# Patient Record
Sex: Female | Born: 1955 | Race: White | Hispanic: No | Marital: Married | Smoking: Never smoker
Health system: Southern US, Community
[De-identification: ages and names within clinical notes are randomized; demographics above are authoritative.]

## PROBLEM LIST (undated history)

## (undated) DIAGNOSIS — C9110 Chronic lymphocytic leukemia of B-cell type not having achieved remission: Secondary | ICD-10-CM

## (undated) DIAGNOSIS — T7840XA Allergy, unspecified, initial encounter: Secondary | ICD-10-CM

## (undated) DIAGNOSIS — E669 Obesity, unspecified: Secondary | ICD-10-CM

## (undated) DIAGNOSIS — Z780 Asymptomatic menopausal state: Secondary | ICD-10-CM

## (undated) DIAGNOSIS — N200 Calculus of kidney: Secondary | ICD-10-CM

## (undated) DIAGNOSIS — K219 Gastro-esophageal reflux disease without esophagitis: Secondary | ICD-10-CM

## (undated) DIAGNOSIS — F419 Anxiety disorder, unspecified: Secondary | ICD-10-CM

## (undated) DIAGNOSIS — M1990 Unspecified osteoarthritis, unspecified site: Secondary | ICD-10-CM

## (undated) DIAGNOSIS — L900 Lichen sclerosus et atrophicus: Secondary | ICD-10-CM

## (undated) DIAGNOSIS — K8020 Calculus of gallbladder without cholecystitis without obstruction: Secondary | ICD-10-CM

## (undated) DIAGNOSIS — N390 Urinary tract infection, site not specified: Secondary | ICD-10-CM

## (undated) DIAGNOSIS — R12 Heartburn: Secondary | ICD-10-CM

## (undated) HISTORY — DX: Calculus of kidney: N20.0

## (undated) HISTORY — DX: Lichen sclerosus et atrophicus: L90.0

## (undated) HISTORY — DX: Calculus of gallbladder without cholecystitis without obstruction: K80.20

## (undated) HISTORY — PX: CHOLECYSTECTOMY: SHX55

## (undated) HISTORY — DX: Gastro-esophageal reflux disease without esophagitis: K21.9

## (undated) HISTORY — DX: Heartburn: R12

## (undated) HISTORY — DX: Chronic lymphocytic leukemia of B-cell type not having achieved remission: C91.10

## (undated) HISTORY — DX: Allergy, unspecified, initial encounter: T78.40XA

## (undated) HISTORY — PX: DILATION AND CURETTAGE OF UTERUS: SHX78

## (undated) HISTORY — DX: Urinary tract infection, site not specified: N39.0

## (undated) HISTORY — DX: Obesity, unspecified: E66.9

## (undated) HISTORY — DX: Unspecified osteoarthritis, unspecified site: M19.90

## (undated) HISTORY — DX: Asymptomatic menopausal state: Z78.0

## (undated) HISTORY — DX: Anxiety disorder, unspecified: F41.9

## (undated) HISTORY — PX: UPPER GASTROINTESTINAL ENDOSCOPY: SHX188

---

## 1975-12-10 HISTORY — PX: KIDNEY STONE SURGERY: SHX686

## 2003-12-10 HISTORY — PX: ESOPHAGOGASTRODUODENOSCOPY: SHX1529

## 2004-01-05 ENCOUNTER — Encounter: Payer: Self-pay | Admitting: Family Medicine

## 2004-11-20 ENCOUNTER — Encounter: Payer: Self-pay | Admitting: Family Medicine

## 2006-01-29 ENCOUNTER — Encounter: Payer: Self-pay | Admitting: Family Medicine

## 2006-07-07 ENCOUNTER — Encounter: Payer: Self-pay | Admitting: Family Medicine

## 2006-08-26 ENCOUNTER — Encounter: Payer: Self-pay | Admitting: Family Medicine

## 2006-09-24 ENCOUNTER — Encounter: Payer: Self-pay | Admitting: Family Medicine

## 2006-12-09 HISTORY — PX: HYSTEROSCOPY WITH D & C: SHX1775

## 2006-12-09 HISTORY — PX: CARDIAC CATHETERIZATION: SHX172

## 2008-06-01 ENCOUNTER — Encounter: Payer: Self-pay | Admitting: Family Medicine

## 2008-11-01 ENCOUNTER — Encounter: Payer: Self-pay | Admitting: Family Medicine

## 2008-11-07 ENCOUNTER — Encounter: Payer: Self-pay | Admitting: Family Medicine

## 2008-12-09 HISTORY — PX: COLONOSCOPY: SHX174

## 2009-01-02 ENCOUNTER — Encounter: Payer: Self-pay | Admitting: Family Medicine

## 2009-05-22 ENCOUNTER — Ambulatory Visit: Payer: Self-pay | Admitting: Family Medicine

## 2009-05-22 DIAGNOSIS — K219 Gastro-esophageal reflux disease without esophagitis: Secondary | ICD-10-CM

## 2009-05-22 DIAGNOSIS — R609 Edema, unspecified: Secondary | ICD-10-CM | POA: Insufficient documentation

## 2009-05-22 DIAGNOSIS — E669 Obesity, unspecified: Secondary | ICD-10-CM | POA: Insufficient documentation

## 2009-05-22 HISTORY — DX: Edema, unspecified: R60.9

## 2009-05-22 HISTORY — DX: Obesity, unspecified: E66.9

## 2009-05-22 HISTORY — DX: Gastro-esophageal reflux disease without esophagitis: K21.9

## 2009-05-25 ENCOUNTER — Encounter: Payer: Self-pay | Admitting: Family Medicine

## 2009-05-29 LAB — CONVERTED CEMR LAB
AST: 14 units/L (ref 0–37)
Alkaline Phosphatase: 74 units/L (ref 39–117)
BUN: 13 mg/dL (ref 6–23)
Calcium: 8.8 mg/dL (ref 8.4–10.5)
Cholesterol: 208 mg/dL — ABNORMAL HIGH (ref 0–200)
LDL Cholesterol: 137 mg/dL — ABNORMAL HIGH (ref 0–99)
Potassium: 4.3 meq/L (ref 3.5–5.3)
Total CHOL/HDL Ratio: 4.2
Triglycerides: 111 mg/dL (ref <150)

## 2009-06-21 ENCOUNTER — Ambulatory Visit: Payer: Self-pay | Admitting: Obstetrics & Gynecology

## 2009-07-11 ENCOUNTER — Encounter: Payer: Self-pay | Admitting: Family Medicine

## 2009-09-15 ENCOUNTER — Telehealth: Payer: Self-pay | Admitting: Family Medicine

## 2009-09-21 ENCOUNTER — Ambulatory Visit: Payer: Self-pay | Admitting: Family Medicine

## 2010-02-21 ENCOUNTER — Ambulatory Visit: Payer: Self-pay | Admitting: Obstetrics & Gynecology

## 2010-02-22 ENCOUNTER — Encounter: Payer: Self-pay | Admitting: Obstetrics & Gynecology

## 2010-02-22 LAB — CONVERTED CEMR LAB: Clue Cells Wet Prep HPF POC: NONE SEEN

## 2010-03-06 ENCOUNTER — Encounter: Admission: RE | Admit: 2010-03-06 | Discharge: 2010-03-06 | Payer: Self-pay | Admitting: Obstetrics & Gynecology

## 2010-06-19 ENCOUNTER — Ambulatory Visit: Payer: Self-pay | Admitting: Family Medicine

## 2010-06-19 DIAGNOSIS — M2550 Pain in unspecified joint: Secondary | ICD-10-CM | POA: Insufficient documentation

## 2010-06-19 DIAGNOSIS — J209 Acute bronchitis, unspecified: Secondary | ICD-10-CM | POA: Insufficient documentation

## 2010-06-19 HISTORY — DX: Pain in unspecified joint: M25.50

## 2010-06-20 LAB — CONVERTED CEMR LAB
Basophils Absolute: 0 10*3/uL (ref 0.0–0.1)
Basophils Relative: 0 % (ref 0–1)
Eosinophils Absolute: 0 10*3/uL (ref 0.0–0.7)
Eosinophils Relative: 0 % (ref 0–5)
Hemoglobin: 13.4 g/dL (ref 12.0–15.0)
Lymphocytes Relative: 25 % (ref 12–46)
Lymphs Abs: 1.7 10*3/uL (ref 0.7–4.0)
MCHC: 32.1 g/dL (ref 30.0–36.0)
MCV: 96.1 fL (ref 78.0–100.0)
Neutrophils Relative %: 67 % (ref 43–77)
WBC: 6.6 10*3/uL (ref 4.0–10.5)

## 2010-08-11 ENCOUNTER — Ambulatory Visit: Payer: Self-pay | Admitting: Family Medicine

## 2010-08-11 DIAGNOSIS — K921 Melena: Secondary | ICD-10-CM | POA: Insufficient documentation

## 2010-08-11 DIAGNOSIS — R1084 Generalized abdominal pain: Secondary | ICD-10-CM | POA: Insufficient documentation

## 2010-08-11 LAB — CONVERTED CEMR LAB
BUN: 17 mg/dL (ref 6–23)
Calcium: 9.5 mg/dL (ref 8.4–10.5)
Chloride: 106 meq/L (ref 96–112)
Creatinine, Ser: 0.62 mg/dL (ref 0.40–1.20)
Glucose, Bld: 92 mg/dL (ref 70–99)
Potassium: 4.8 meq/L (ref 3.5–5.3)
Sodium: 143 meq/L (ref 135–145)

## 2010-08-13 ENCOUNTER — Encounter: Payer: Self-pay | Admitting: Family Medicine

## 2010-08-14 ENCOUNTER — Encounter: Payer: Self-pay | Admitting: Family Medicine

## 2010-09-21 ENCOUNTER — Encounter: Payer: Self-pay | Admitting: Family Medicine

## 2010-11-20 ENCOUNTER — Encounter: Payer: Self-pay | Admitting: Obstetrics & Gynecology

## 2010-11-20 ENCOUNTER — Ambulatory Visit: Payer: Self-pay | Admitting: Obstetrics & Gynecology

## 2010-11-20 LAB — CONVERTED CEMR LAB
Clue Cells Wet Prep HPF POC: NONE SEEN
Trich, Wet Prep: NONE SEEN
Yeast Wet Prep HPF POC: NONE SEEN

## 2010-12-05 ENCOUNTER — Ambulatory Visit: Payer: Self-pay | Admitting: Obstetrics & Gynecology

## 2010-12-25 ENCOUNTER — Ambulatory Visit
Admission: RE | Admit: 2010-12-25 | Discharge: 2010-12-25 | Payer: Self-pay | Source: Home / Self Care | Attending: Obstetrics & Gynecology | Admitting: Obstetrics & Gynecology

## 2010-12-26 ENCOUNTER — Ambulatory Visit
Admission: RE | Admit: 2010-12-26 | Discharge: 2010-12-26 | Payer: Self-pay | Source: Home / Self Care | Attending: Family Medicine | Admitting: Family Medicine

## 2010-12-26 DIAGNOSIS — N390 Urinary tract infection, site not specified: Secondary | ICD-10-CM | POA: Insufficient documentation

## 2010-12-26 LAB — CONVERTED CEMR LAB
Bilirubin Urine: NEGATIVE
Ketones, urine, test strip: NEGATIVE
Specific Gravity, Urine: 1.025
pH: 6

## 2010-12-27 ENCOUNTER — Ambulatory Visit: Admit: 2010-12-27 | Payer: Self-pay | Admitting: Family Medicine

## 2011-01-08 NOTE — Miscellaneous (Signed)
Summary: Flu/[name]  Flu/[place] [facility]   Imported By: Lanelle Bal 01/15/2010 10:59:35  _____________________________________________________________________  External Attachment:    Type:   Image     Comment:   External Document

## 2011-01-08 NOTE — Assessment & Plan Note (Signed)
Summary: Followup call  Followup call to patient:  she reports that she has less abdominal discomfort.  Stools are now firm but she continues to have blood coating her stools.  No fever.  She feels well otherwise.  Discussed normal CMP results with patient.  Plan:   Arrange GI follow-up as soon as possible.  Will refer to [facility] gastroenterology. If symptoms become significantly worse during the night or over the weekend, proceed to the local emergency room.  Donna Christen MD  August 13, 2010 9:50 AM    08/14/10.  Called [facility] @ [phone] to make appointment.  Had cancellation today, patient is scheduled for today @ 1:45.  She will be seen by Dr. Rolland Bimler.  Patient is aware of appointment.  WB

## 2011-01-08 NOTE — Letter (Signed)
Summary: [facility]   Imported By: Lanelle Bal 08/23/2010 11:52:41  _____________________________________________________________________  External Attachment:    Type:   Image     Comment:   External Document

## 2011-01-08 NOTE — Miscellaneous (Signed)
Summary: colonoscopy normal  Clinical Lists Changes  Observations: Added new observation of COLONRECACT: Repeat colonoscopy in 5 years.  (09/19/2010 16:46) Added new observation of COLONOSCOPY: Location:  [facility]   normal.  (09/19/2010 16:46)      Colonoscopy  Procedure date:  09/19/2010  Findings:      Location:  [facility]   normal.   Comments:      Repeat colonoscopy in 5 years.    Colonoscopy  Procedure date:  09/19/2010  Findings:      Location:  [facility]   normal.   Comments:      Repeat colonoscopy in 5 years.

## 2011-01-08 NOTE — Assessment & Plan Note (Signed)
Summary: cough/ joint pains   Vital Signs:  Patient profile:   55 year old female Height:      64 inches Weight:      238 pounds BMI:     41.00 O2 Sat:      96 % on Room air Temp:     98.8 degrees F oral Pulse rate:   103 / minute BP sitting:   112 / 72  (left arm) Cuff size:   large  Vitals Entered By: Payton Spark CMA (June 19, 2010 10:48 AM)  O2 Flow:  Room air CC: Body aches, cough and diarrhea. Also c/o L hand pain.   Primary Care Provider:  Seymour Bars DO  CC:  Body aches and cough and diarrhea. Also c/o L hand pain.Marland Kitchen  History of Present Illness: 55 yo WF presents for a dry cough that started about 5 days ago.  Denies any sore throat or runny nose.  Denies any sputum production.  Has fatigue, malaise, achy joints and diarrhea.  Has nausea but no vomitting.  Denies having much of an appetite.  She is taking Extra Strength Tylenol and Delsym.  Has had a temp up to 101 at night.  Having chills.  Denies any sick contacts.    She is also having some pain in the hand joints that started 2 mos ago.    Current Medications (verified): 1)  Prilosec Otc 20 Mg Tbec (Omeprazole Magnesium) .... Take 1 Tab By Mouth Once Daily  Allergies (verified): No Known Drug Allergies  Past History:  Past Medical History: Reviewed history from 05/22/2009 and no changes required. obesity GERD  gyn: DR Margo Aye, pap 12-2008 colonoscopy done 2006 -- normal with EGD  Past Surgical History: Reviewed history from 07/11/2009 and no changes required. kidney stone removal 1978 heart cath 2006, normal D&C 2008; hysteroscopy for endometrial polyp  Family History: Reviewed history from 05/22/2009 and no changes required. father alive, AMI at 38, stroke in 15s, high chol, HTN mother HTN brother healthy  Social History: Reviewed history from 05/22/2009 and no changes required. 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  Has 2 grown children. son local, daughter  in [place] with 5 grandchildren. Denies ETOH. Never smoked. Walks on treadmill 30 min 4 x a wk.  Review of Systems      See HPI  Physical Exam  General:  alert, well-developed, well-nourished, and well-hydrated.  obese Head:  normocephalic and atraumatic.  sinuses NTTP Eyes:  conjunctiva clear; sclera non icteric Mouth:  pharynx pink and moist.   Neck:  no masses.   Lungs:  Normal respiratory effort, chest expands symmetrically. Lungs are clear to auscultation, no crackles or wheezes. dry hacking cough Heart:  Normal rate and regular rhythm. S1 and S2 normal without gallop, murmur, click, rub or other extra sounds. Abdomen:  soft, non-tender, normal bowel sounds, no distention, no masses, no guarding, no hepatomegaly, and no splenomegaly.   Extremities:  no LE edema Skin:  color normal.  no jaundice Cervical Nodes:  No lymphadenopathy noted Psych:  good eye contact, not anxious appearing, and not depressed appearing.     Impression & Recommendations:  Problem # 1:  ACUTE BRONCHITIS (ICD-466.0) Likely viral syndrome, going on day 6 with constitutional symptoms.  Will cover her with Zithromax and check labs today. Her updated medication list for this problem includes:    Zithromax Z-pak 250 Mg Tabs (Azithromycin) .Marland Kitchen... 2 tabs by mouth x 1 day then 1 tab by  mouth daily x 4 days  Problem # 2:  POLYARTHRITIS (ICD-719.49) Likely due to viral syndrome.  Will check ESR with CBC today.  No sign of systemic rash or insect bite. Orders: T-CBC w/Diff (08657-84696) T-Sed Rate (Automated) (29528-41324)  Complete Medication List: 1)  Prilosec Otc 20 Mg Tbec (Omeprazole magnesium) .... Take 1 tab by mouth once daily 2)  Zithromax Z-pak 250 Mg Tabs (Azithromycin) .... 2 tabs by mouth x 1 day then 1 tab by mouth daily x 4 days  Patient Instructions: 1)  Take Zithromax x 5 days for bronchitis. 2)  Use Advil Cold and Flu for symptomatic relief for the next 3-4 days. 3)  Oral hydration. 4)   Labs today. 5)  Will call you w/ results tomorrow. Prescriptions: ZITHROMAX Z-PAK 250 MG TABS (AZITHROMYCIN) 2 tabs by mouth x 1 day then 1 tab by mouth daily x 4 days  #1 pack x 0   Entered and Authorized by:   Seymour Bars DO   Signed by:   Seymour Bars DO on 06/19/2010   Method used:   [phone] (retail)       [address]       [place], [place]  [address]       Ph: [phone] or [phone]       Fax: [phone]   RxID:   [id number]

## 2011-01-08 NOTE — Assessment & Plan Note (Signed)
Summary: D/blood in stool x last night rm 5   Vital Signs:  Patient Profile:   55 Years Old Female CC:      D/blood in stool x last night Height:     64 inches Weight:      239 pounds O2 Sat:      100 % O2 treatment:    Room Air Temp:     97.9 degrees F oral Pulse rate:   64 / minute Pulse rhythm:   regular Resp:     16 per minute BP sitting:   123 / 73  (left arm) Cuff size:   regular  Vitals Entered By: Areta Haber CMA (August 11, 2010 1:51 PM)                  Current Allergies: No known allergies History of Present Illness Chief Complaint: D/blood in stool x last night History of Present Illness:  Subjective:  Patient complains of feeling abdominal bloating without pain yesterday morning, and some hearburn last night.  She takes Prilosec daily.  At [facility] today she had abdominal discomfort.  She then had a bowel movement with blood in stool.  She had another bowel movement later, more loose, with persistent blood in stool.  No nausea/vomiting.  No fevers, chills, and sweats.  No recent changes in bowel movements otherwise.  She denies pain with bowel movements.  No hemorrhoids.  She had a colonoscopy in 2006 that was reported as negative.  She states that she does take aspirin  Current Problems: ABDOMINAL PAIN, GENERALIZED (ICD-789.07) HEMATOCHEZIA (ICD-578.1) ACUTE BRONCHITIS (ICD-466.0) POLYARTHRITIS (ICD-719.49) GERD (ICD-530.81) OBESITY (ICD-278.00) OTH&UNSPEC ENDOCRN NUTRIT METAB&IMMUNITY D/O (ICD-V77.99) SCREENING FOR LIPOID DISORDERS (ICD-V77.91) LEG EDEMA, BILATERAL (ICD-782.3)   Current Meds PRILOSEC OTC 20 MG TBEC (OMEPRAZOLE MAGNESIUM) Take 1 tab by mouth once daily LORTAB 5 5-500 MG TABS (HYDROCODONE-ACETAMINOPHEN) One by mouth q4 to 6hr as needed pain  REVIEW OF SYSTEMS Constitutional Symptoms      Denies fever, chills, night sweats, weight loss, weight gain, and fatigue.  Eyes       Denies change in vision, eye pain, eye discharge,  glasses, contact lenses, and eye surgery. Ear/Nose/Throat/Mouth       Denies hearing loss/aids, change in hearing, ear pain, ear discharge, dizziness, frequent runny nose, frequent nose bleeds, sinus problems, sore throat, hoarseness, and tooth pain or bleeding.  Respiratory       Denies dry cough, productive cough, wheezing, shortness of breath, asthma, bronchitis, and emphysema/COPD.  Cardiovascular       Denies murmurs, chest pain, and tires easily with exhertion.    Gastrointestinal       Complains of stomach pain, diarrhea, and blood in bowel movements.      Denies nausea/vomiting, constipation, and indigestion.      Comments: x last night Genitourniary       Denies painful urination, kidney stones, and loss of urinary control. Neurological       Denies paralysis, seizures, and fainting/blackouts. Musculoskeletal       Denies muscle pain, joint pain, joint stiffness, decreased range of motion, redness, swelling, muscle weakness, and gout.  Skin       Denies bruising, unusual mles/lumps or sores, and hair/skin or nail changes.  Psych       Denies mood changes, temper/anger issues, anxiety/stress, speech problems, depression, and sleep problems.  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  Has 2 grown children. son local, daughter in [place] with 5 grandchildren. Denies ETOH. Never smoked. Walks on treadmill 30 min 4 x a wk.  Risk Factors: Alcohol Use: 0 (05/22/2009) Exercise: yes  (05/22/2009)  Risk Factors: Smoking Status: never (05/22/2009)   Objective:  No acute distress  Eyes:  Pupils are equal, round, and reactive to light and accomdation.  Extraocular movement is intact.  Conjunctivae are not inflamed.  Mouth:  Normal mucosae Neck:  Supple.  No adenopathy is present.  No thyromegaly is present  Lungs:  Clear to auscultation.  Breath sounds are equal.  Heart:  Regular rate and rhythm without murmurs, rubs, or gallops.  Abdomen:  Nontender without masses or hepatosplenomegaly.  Bowel sounds are present.  No CVA or flank tenderness.  CBC:  WBC 6.5; Hgb 13.2; platelets 189 Assessment New Problems: ABDOMINAL PAIN, GENERALIZED (ICD-789.07) HEMATOCHEZIA (ICD-578.1)  NO EVIDENCE ACTIVE GI BLEEDING.  EXAM BENIGN.  Plan New Medications/Changes: LORTAB 5 5-500 MG TABS (HYDROCODONE-ACETAMINOPHEN) One by mouth q4 to 6hr as needed pain  #12 (twelve) x 0, 08/11/2010, Donna Christen MD  New Orders: T-Comprehensive Metabolic Panel [[id number]] T-PTT [[id number]] CBC w/Diff [[id number]] Est. Patient Level III [29562] Planning Comments:   Check CMP.  Stop aspirin.  Begin clear liquid diet today and slowly advance.  Rx for Lortab. Make follow-up appt with GI as soon as possible to set up colonoscopy. If symptoms become significantly worse during the night or over the weekend, proceed to the local emergency room.   The patient and/or caregiver has been counseled thoroughly with regard to medications prescribed including dosage, schedule, interactions, rationale for use, and possible side effects and they verbalize understanding.  Diagnoses and expected course of recovery discussed and will return if not improved as expected or if the condition worsens. Patient and/or caregiver verbalized understanding.  Prescriptions: LORTAB 5 5-500 MG TABS (HYDROCODONE-ACETAMINOPHEN) One by mouth q4 to 6hr as needed pain  #12 (twelve) x 0   Entered and Authorized by:   Donna Christen MD   Signed by:   Donna Christen MD on 08/11/2010   Method used:   Print then Give to Patient   RxID:   [id number]   Orders Added: 1)  T-Comprehensive Metabolic Panel [[id number]] 2)  T-PTT [84132-44010] 3)  CBC w/Diff [27253-66440] 4)  Est. Patient Level III [34742]

## 2011-01-08 NOTE — Letter (Signed)
Summary: REFERRAL TO [facility]  REFERRAL TO [facility]   Imported By: Dannette Barbara 08/14/2010 10:23:43  _____________________________________________________________________  External Attachment:    Type:   Image     Comment:   External Document

## 2011-01-10 NOTE — Assessment & Plan Note (Signed)
Summary: UTI   Vital Signs:  Patient profile:   55 year old female Height:      64 inches Weight:      245 pounds BMI:     42.21 O2 Sat:      98 % on Room air Temp:     97.6 degrees F oral Pulse rate:   73 / minute BP sitting:   117 / 71  (left arm) Cuff size:   large  Vitals Entered By: Payton Spark CMA (December 26, 2010 11:31 AM)  O2 Flow:  Room air CC: ? UTI x 2 days.    Primary Care Provider:  Seymour Bars DO  CC:  ? UTI x 2 days. Marland Kitchen  History of Present Illness: 55 yo WF presents for a lower back ache that started last night.  She is having frequency of urination, urgency, suprapubic pain and dysuria.  She has not had a UTI in a while.  Denies any vaginal discharge thougth she was recently treated for a yeast infection.  Denies fevers, chills or nausea.    Allergies: No Known Drug Allergies  Past History:  Past Medical History: Reviewed history from 05/22/2009 and no changes required. obesity GERD  gyn: DR Margo Aye, pap 12-2008 colonoscopy done 2006 -- normal with EGD  Social History: Reviewed history from 05/22/2009 and no changes required. 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  Has 2 grown children. son local, daughter in [place] with 5 grandchildren. Denies ETOH. Never smoked. Walks on treadmill 30 min 4 x a wk.  Review of Systems      See HPI  Physical Exam  General:  alert, well-developed, well-nourished, and well-hydrated.  morbidly obese Head:  normocephalic and atraumatic.   Eyes:  sclera non icteric Mouth:  pharynx pink and moist.   Lungs:  Normal respiratory effort, chest expands symmetrically. Lungs are clear to auscultation, no crackles or wheezes. Heart:  Normal rate and regular rhythm. S1 and S2 normal without gallop, murmur, click, rub or other extra sounds. Abdomen:  suprapbuic TTP, no CVAT Skin:  color normal.     Impression & Recommendations:  Problem # 1:  UTI (ICD-599.0)  UA + for  infection; day 2 of symptoms. Will treat with Macrobid x 7 days + supportive care measures.  Call back if symptoms have not resolved in 7 days. Her updated medication list for this problem includes:    Macrobid 100 Mg Caps (Nitrofurantoin monohyd macro) .Marland Kitchen... 1 capsule by mouth two times a day x 7 days  Orders: UA Dipstick w/o Micro (automated)  (81003)  Complete Medication List: 1)  Prilosec Otc 20 Mg Tbec (Omeprazole magnesium) .... Take 1 tab by mouth once daily 2)  Lortab 5 5-500 Mg Tabs (Hydrocodone-acetaminophen) .... One by mouth q4 to 6hr as needed pain 3)  Macrobid 100 Mg Caps (Nitrofurantoin monohyd macro) .Marland Kitchen.. 1 capsule by mouth two times a day x 7 days  Patient Instructions: 1)  Take Macrobid for UTI. 2)  Call if symptoms are not resolved in 7 days. 3)  Abstain from sex during treatment. 4)  Drink plenty of water.   Prescriptions: MACROBID 100 MG CAPS (NITROFURANTOIN MONOHYD MACRO) 1 capsule by mouth two times a day x 7 days  #14 x 0   Entered and Authorized by:   Seymour Bars DO   Signed by:   Seymour Bars DO on 12/26/2010   Method used:   [facility]  [address] [phone][facility] (retail)       [address] [place], [place]  [address]       Ph: [phone] or [phone]       Fax: [phone]   RxID:   [id number]    Orders Added: 1)  UA Dipstick w/o Micro (automated)  [81003] 2)  Est. Patient Level III [36644]    Laboratory Results   Urine Tests    Routine Urinalysis   Color: yellow Appearance: Clear Glucose: negative   (Normal Range: Negative) Bilirubin: negative   (Normal Range: Negative) Ketone: negative   (Normal Range: Negative) Spec. Gravity: 1.025   (Normal Range: 1.003-1.035) Blood: trace-intact   (Normal Range: Negative) pH: 6.0   (Normal Range: 5.0-8.0) Protein: negative   (Normal Range: Negative) Urobilinogen: 0.2   (Normal Range: 0-1) Nitrite: positive   (Normal Range: Negative) Leukocyte Esterace: trace   (Normal  Range: Negative)

## 2011-01-15 ENCOUNTER — Encounter: Payer: Self-pay | Admitting: Family Medicine

## 2011-03-02 ENCOUNTER — Encounter: Payer: Self-pay | Admitting: Emergency Medicine

## 2011-03-02 ENCOUNTER — Inpatient Hospital Stay (INDEPENDENT_AMBULATORY_CARE_PROVIDER_SITE_OTHER)
Admission: RE | Admit: 2011-03-02 | Discharge: 2011-03-02 | Disposition: A | Discharge status: Home / Self Care | Payer: Managed Care, Other (non HMO) | Source: Ambulatory Visit | Attending: Emergency Medicine | Admitting: Emergency Medicine

## 2011-03-02 DIAGNOSIS — J069 Acute upper respiratory infection, unspecified: Secondary | ICD-10-CM

## 2011-03-04 ENCOUNTER — Telehealth (INDEPENDENT_AMBULATORY_CARE_PROVIDER_SITE_OTHER): Payer: Self-pay | Admitting: *Deleted

## 2011-03-07 NOTE — Assessment & Plan Note (Signed)
Summary: SINUS ISSUES,COUGH/WSE(rm4)   Vital Signs:  Patient Profile:   55 Years Old Female CC:      SINUS/COUGH Height:     64 inches Weight:      248 pounds O2 Sat:      97 % O2 treatment:    Room Air Temp:     98.3 degrees F oral Pulse rate:   79 / minute Resp:     20 per minute BP sitting:   113 / 79  (left arm) Cuff size:   regular  Vitals Entered By: Linton Flemings RN (March 02, 2011 4:03 PM)                  Updated Prior Medication List: PRILOSEC OTC 20 MG TBEC (OMEPRAZOLE MAGNESIUM) Take 1 tab by mouth once daily LORTAB 5 5-500 MG TABS (HYDROCODONE-ACETAMINOPHEN) One by mouth q4 to 6hr as needed pain MACROBID 100 MG CAPS (NITROFURANTOIN MONOHYD MACRO) 1 capsule by mouth two times a day x 7 days  Current Allergies: No known allergies History of Present Illness History from: patient Chief Complaint: SINUS/COUGH History of Present Illness: 55 Years Old Female complains of onset of cold symptoms for 2 days.  SHERRIETTA has been using Tylenol. No sore throat +cough No pleuritic pain No wheezing +nasal congestion +post-nasal drainage +sinus pain/pressure No chest congestion No itchy/red eyes No earache No hemoptysis No SOB +chills/sweats +fever No nausea No vomiting No abdominal pain No diarrhea No skin rashes +fatigue No myalgias No headache   REVIEW OF SYSTEMS Constitutional Symptoms       Complains of fever, chills, and fatigue.     Denies night sweats, weight loss, and weight gain.  Eyes       Denies change in vision, eye pain, eye discharge, glasses, contact lenses, and eye surgery. Ear/Nose/Throat/Mouth       Complains of frequent runny nose, frequent nose bleeds, sinus problems, and sore throat.      Denies hearing loss/aids, change in hearing, ear pain, ear discharge, dizziness, hoarseness, and tooth pain or bleeding.  Respiratory       Complains of dry cough.      Denies productive cough, wheezing, shortness of breath, asthma,  bronchitis, and emphysema/COPD.  Cardiovascular       Denies murmurs, chest pain, and tires easily with exhertion.    Gastrointestinal       Denies stomach pain, nausea/vomiting, diarrhea, constipation, blood in bowel movements, and indigestion. Genitourniary       Denies painful urination, kidney stones, and loss of urinary control. Neurological       Denies paralysis, seizures, and fainting/blackouts. Musculoskeletal       Denies muscle pain, joint pain, joint stiffness, decreased range of motion, redness, swelling, muscle weakness, and gout.  Skin       Denies bruising, unusual mles/lumps or sores, and hair/skin or nail changes.  Psych       Denies mood changes, temper/anger issues, anxiety/stress, speech problems, depression, and sleep problems. Other Comments: states symptoms started two days ago   Past History:  Past Medical History: Reviewed history from 05/22/2009 and no changes required. obesity GERD  gyn: DR Margo Aye, pap 12-2008 colonoscopy done 2006 -- normal with EGD  Past Surgical History: Reviewed history from 07/11/2009 and no changes required. kidney stone removal 1978 heart cath 2006, normal D&C 2008; hysteroscopy for endometrial polyp  Family History: Reviewed history from 05/22/2009 and no changes required. father alive, AMI at 81, stroke in 39s, high chol, HTN  mother HTN brother healthy  Social History: Reviewed history from 05/22/2009 and no changes required. 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  Has 2 grown children. son local, daughter in [place] with 5 grandchildren. Denies ETOH. Never smoked. Walks on treadmill 30 min 4 x a wk. Physical Exam General appearance: well developed, well nourished, no acute distress Ears: normal, no lesions or deformities Nasal: mucosa pink, nonedematous, no septal deviation, turbinates normal Oral/Pharynx: clear PND, no erythema, no exudates, OP patent Chest/Lungs: no  rales, wheezes, or rhonchi bilateral, breath sounds equal without effort Heart: regular rate and  rhythm, no murmur MSE: oriented to time, place, and person Assessment New Problems: UPPER RESPIRATORY INFECTION, ACUTE (ICD-465.9)   Plan New Medications/Changes: CLARITIN-D 12 HOUR 5-120 MG XR12H-TAB (LORATADINE-PSEUDOEPHEDRINE) 1 by mouth two times a day for 5 days  #10 x 0, 03/02/2011, Hoyt Koch MD CHERATUSSIN AC 100-10 MG/5ML SYRP (GUAIFENESIN-CODEINE) 5cc q6 hrs as needed cough  #5oz x 0, 03/02/2011, Hoyt Koch MD  New Orders: Est. Patient Level IV [16109] Pulse Oximetry (single measurment) [94760] Services provided After hours-Weekends-Holidays [99051] Rapid Strep [60454] Planning Comments:   Rapid strep negative.  No culture done. 1)  No antibiotic given since likely viral.  Cough meds and antihistamine.  If not improving, can call in Amox 875 two times a day for 7 days to treat sinusitis. 2)  Use nasal saline solution (over the counter) at least 3 times a day. 3)  Use over the counter decongestants like Zyrtec-D every 12 hours as needed to help with congestion. 4)  Can take tylenol every 6 hours or motrin every 8 hours for pain or fever. 5)  Follow up with your primary doctor  if no improvement in 5-7 days, sooner if increasing pain, fever, or new symptoms.    The patient and/or caregiver has been counseled thoroughly with regard to medications prescribed including dosage, schedule, interactions, rationale for use, and possible side effects and they verbalize understanding.  Diagnoses and expected course of recovery discussed and will return if not improved as expected or if the condition worsens. Patient and/or caregiver verbalized understanding.  Prescriptions: CLARITIN-D 12 HOUR 5-120 MG XR12H-TAB (LORATADINE-PSEUDOEPHEDRINE) 1 by mouth two times a day for 5 days  #10 x 0   Entered and Authorized by:   Hoyt Koch MD   Signed by:   Hoyt Koch MD on  03/02/2011   Method used:   Print then Give to Patient   RxID:   [id number] CHERATUSSIN AC 100-10 MG/5ML SYRP (GUAIFENESIN-CODEINE) 5cc q6 hrs as needed cough  #5oz x 0   Entered and Authorized by:   Hoyt Koch MD   Signed by:   Hoyt Koch MD on 03/02/2011   Method used:   Print then Give to Patient   RxID:   [id number]   Orders Added: 1)  Est. Patient Level IV [41324] 2)  Pulse Oximetry (single measurment) [94760] 3)  Services provided After hours-Weekends-Holidays [99051] 4)  Rapid Strep [40102]    Laboratory Results  Date/Time Received: March 02, 2011 4:35 PM  Date/Time Reported: March 02, 2011 4:36 PM   Other Tests  Rapid Strep: negative  Kit Test Internal QC: Negative   (Normal Range: Negative)

## 2011-03-12 NOTE — Progress Notes (Signed)
  Phone Note Outgoing Call Call back at [facility] Phone [phone]   Call placed by: Emilio Math,  March 04, 2011 1:13 PM Call placed to: Patient Summary of Call: Left msg hope she's feeling better, call with any concerns

## 2011-04-23 NOTE — Assessment & Plan Note (Signed)
NAME:  Sara Sanford, Sara Sanford          ACCOUNT NO.:  [id number]   MEDICAL RECORD NO.:  [id number]          PATIENT TYPE:  POB   LOCATION:  [facility] [place]         FACILITY:  [facility]   PHYSICIAN:  Allie Bossier, MD        DATE OF BIRTH:  1956-06-16   DATE OF SERVICE:  02/21/2010                                  CLINIC NOTE   HISTORY OF PRESENT ILLNESS:  Sara Sanford is a 55 year old married,  white, gravida 4, para 3, abortus 1 with 2 living children.  She comes  in for her annual exam.  She has no particular GYN complaints.  She  still has occasional hot flashes, but they have decreased somewhat.  Her  last period was January 02, 2009.   PAST MEDICAL PROBLEMS:  Obesity and heartburn.   REVIEW OF SYSTEMS:  Her husband had a vasectomy done.  She has been  married for the last 39 years.  She is sexually active and denies  dyspareunia.  She had a colonoscopy at age 35.   PAST SURGICAL HISTORY:  She had a D and C after miscarriage,  hysteroscopy and D and C for postmenopausal bleeding in 2008, and a  heart catheterization in 2008.   SOCIAL HISTORY:  Negative for tobacco, alcohol, or drug use.   MEDICATIONS:  She takes pantoprazole 40 mg daily and calcium 600 mg  daily.   FAMILY HISTORY:  Negative for breast, GYN, and colon malignancies.   ALLERGIES:  No known drug allergies.   PHYSICAL EXAMINATION:  VITAL SIGNS:  Weight 241, height 5 feet 4 inches,  BP 112/78, and pulse 72.  HEENT:  Normal.  HEART:  Regular rate and rhythm.  LUNGS:  Clear to auscultation bilaterally.  BREASTS:  Normal bilaterally.  ABDOMEN:  Obese.  No hepatosplenomegaly.  EXTERNAL GENITALIA:  Moderate amount of atrophy.  Paucity of discharge.  Cervix, no lesions.  Bimanual exam, uterus is normal size and shape,  midplane.  Adnexa nontender.  No masses.   ASSESSMENT AND PLAN:  1. Annual exam.  I have checked Pap smear.  Recommended self-breast      and self vulvar exams.  2. She does complain of some minimal  2-day history of vaginal itching,      so I did a wet prep.  She thinks that this      itching is probably related to change in detergent, but I will make      sure there is no infection process going on.  I have recommended      weight loss and she is aware of that.      Allie Bossier, MD     MCD/MEDQ  D:  02/21/2010  T:  02/22/2010  Job:  [id number]

## 2011-04-23 NOTE — Assessment & Plan Note (Signed)
NAME:  Sara Sanford, Sara Sanford          ACCOUNT NO.:  [id number]   MEDICAL RECORD NO.:  [id number]          PATIENT TYPE:  POB   LOCATION:  [facility] [place]         FACILITY:  [facility]   PHYSICIAN:  Allie Bossier, MD        DATE OF BIRTH:  18-Jul-1956   DATE OF SERVICE:  06/21/2009                                  CLINIC NOTE   HISTORY:  Ms. Chandran is a 55 year old married white G4, P3, A1, lives  with 2 living children, who comes in here to become an established  patient.  Her entire annual exam with Pap smear and mammogram were done  in January of this year.  However, she has decided to leave her practice  in [place], and join [place] here in her hometown in [place].  Today,  she has no particular complaints.   PAST MEDICAL HISTORY:  Obesity and heartburn/question ulcer.   PAST SURGICAL HISTORY:  She had a D and C after miscarriage,  hysteroscopy, and D and C for postmenopausal bleeding in 2008, and a  heart catheterization in 2008.   REVIEW OF SYSTEMS:  She has been married for 38 years.  Her husband had  a vasectomy many years ago.  Her new family doctor is Dr. Cathey Endow.  She  went through menopause at around 2005.  She works at [employer]  in [employer].  She has occasional hot flashes, but these have lessened  over the last 5 years and she denies dyspareunia.   SOCIAL HISTORY:  Negative for tobacco, alcohol, or drug use.   ALLERGIES:  No known drug allergies.  No latex allergies.   FAMILY HISTORY:  Negative for breast, GYN, and colon malignancies.   MEDICATIONS:  She takes pantoprazole 40 mg a day.   Her physical exam was deferred since she is up-to-date.  She has no  questions about me or the practice and she will plan to see [place] in  January for her annual visit.      Allie Bossier, MD     MCD/MEDQ  D:  06/21/2009  T:  06/22/2009  Job:  [phone] [id number]

## 2011-04-23 NOTE — Assessment & Plan Note (Signed)
NAMEJERONICA, STLOUIS          ACCOUNT NO.:  [id number]   MEDICAL RECORD NO.:  [id number]          PATIENT TYPE:  POB   LOCATION:  [facility] [place]         FACILITY:  [facility]   PHYSICIAN:  Allie Bossier, MD        DATE OF BIRTH:  Nov 30, 1956   DATE OF SERVICE:  12/25/2010                                  CLINIC NOTE   Ms. Cheever is a 55 year old lady who had a vulvar biopsy on November 20, 2010.  At that time, I felt the area looked like it had sclerosus, I  went ahead and gave her prescription for Vagifem as well as Temovate.  The vulvar biopsy has come back as lichen sclerosus.  She says that she  actually feels much better.  She was confused about the directions of  her Vagifem and she quit using it after 2 weeks, so I have told her to  resume the 10 mcg of Vagifem twice a week and to decrease the Temovate  from three times a week to twice a week.  I will see her for annual in  April of 2012 or sooner if she needs anything in the meantime.      Allie Bossier, MD     MCD/MEDQ  D:  12/25/2010  T:  12/26/2010  Job:  [id number]

## 2011-04-23 NOTE — Assessment & Plan Note (Signed)
NAMELESLEA, VOWLES          ACCOUNT NO.:  [id number]   MEDICAL RECORD NO.:  [id number]          PATIENT TYPE:  POB   LOCATION:  [facility] [place]         FACILITY:  [facility]   PHYSICIAN:  Allie Bossier, MD        DATE OF BIRTH:  1956/07/18   DATE OF SERVICE:  11/20/2010                                  CLINIC NOTE   Ms. Guthridge is a 55 year old married, G4, P3, A1 who was seen here for  annual exam in March of this year.  At that time, she had a 2-day  history of vaginal itching.  A wet prep was negative that day.  I did  see some mild atrophy, but she felt the itching was probably related to  a change in her detergent and she was going to change that detergent and  come back if necessary.  She tells me that the problem has continued for  the last 8 months, but she says now coming back.  She has tried Monistat  recently with a minimal amount of help and some Vagisil.  She says  initially when she was on Vagisil and she feels burning and then the  itching stopped for about 4 hours.   On exam, she has some lichen sclerosus-like lesions of the labia minora  on both sides, the left being worse than the right.  She has lost  melanin in these area.  There is marked atrophy throughout her vagina  and vulva at this point.  I consented her for a vulvar biopsy.  I  prepped the right labia minora and with iodine and then injected 1%  lidocaine.  I biopsied a small piece of tissue and cauterized the area  with silver nitrate.  She will follow back up in 2-4 weeks for results,  but I am going ahead and treating her empirically for lichen sclerosus  with Temovate cream to be used every other night and I have given for  her vaginal atrophy.  I have given her Vagifem 10 mcg to be used nightly  for 2 weeks and then every other night.      Allie Bossier, MD     MCD/MEDQ  D:  11/20/2010  T:  11/21/2010  Job:  [id number]

## 2011-05-15 ENCOUNTER — Other Ambulatory Visit: Payer: Self-pay | Admitting: Obstetrics & Gynecology

## 2011-05-15 DIAGNOSIS — Z1231 Encounter for screening mammogram for malignant neoplasm of breast: Secondary | ICD-10-CM

## 2011-05-29 ENCOUNTER — Other Ambulatory Visit: Payer: Self-pay | Admitting: Obstetrics & Gynecology

## 2011-05-29 ENCOUNTER — Ambulatory Visit (INDEPENDENT_AMBULATORY_CARE_PROVIDER_SITE_OTHER): Payer: Managed Care, Other (non HMO) | Admitting: Obstetrics & Gynecology

## 2011-05-29 DIAGNOSIS — R300 Dysuria: Secondary | ICD-10-CM

## 2011-05-29 DIAGNOSIS — Z01419 Encounter for gynecological examination (general) (routine) without abnormal findings: Secondary | ICD-10-CM

## 2011-05-29 DIAGNOSIS — Z113 Encounter for screening for infections with a predominantly sexual mode of transmission: Secondary | ICD-10-CM

## 2011-05-29 DIAGNOSIS — Z1272 Encounter for screening for malignant neoplasm of vagina: Secondary | ICD-10-CM

## 2011-05-30 NOTE — Assessment & Plan Note (Unsigned)
NAME:  Sara Sanford, Sara Sanford NO.:  [id number]  MEDICAL RECORD NO.:  [id number]           PATIENT TYPE:  LOCATION:  [facility] [place]           FACILITY:  PHYSICIAN:  Allie Bossier, MD        DATE OF BIRTH:  1956-08-09  DATE OF SERVICE:  05/29/2011                                 CLINIC NOTE  HISTORY OF PRESENT ILLNESS:  [name] is a 55 year old married white G4, P3, AA1 with two living children who comes in here for annual exam. Her only complaint today is that of a 2-week history of some dysuria. She says that is not extremely comfortable with previous UTIs, but she does have a little bit of internal dysuria.  Her vaginal itching is some better with the help of the Temovate which she uses 3 times a week.  She has no other GYN complaints.  PAST MEDICAL HISTORY:  Obesity, heartburn, and a questionable history of a "peptic ulcer."  MEDICATIONS:  She takes Pantoprazole 40 mg daily, calcium 600 mg daily, Temovate cream three times a week.  REVIEW OF SYSTEMS:  Her husband had a vasectomy.  They have been married for 40 years.  They are still sexually active, and she occasionally has some dyspareunia, but this is relieved when she uses Astra Glide.  She has been menopausal since 2005 but she has some postmenopausal bleeding and it was evaluated in 2008.  Her family doctor is Dr. Cathey Endow. Mammogram is scheduled for next month.  PAST SURGICAL HISTORY:  She had a D and C after miscarriage.  She had a hysteroscopy and D and C in 2008 for evaluation of postmenopausal bleeding.  She had a heart cath in 2008.  She had an open kidney stone surgery in 1977.  SOCIAL HISTORY:  Negative for tobacco, alcohol, or drug use.  No latex allergies.  No drug allergies.  FAMILY HISTORY:  Negative for GYN and colon cancer but recently 2 paternal aunts in postmenopausal years have been diagnosed with breast cancer.  They are both alive and well.  PHYSICAL EXAMINATION:  GENERAL/VITAL  SIGNS:  Well-nourished, well- hydrated very pleasant white female, height 5 feet 4 inches, weight 242 (unchanged weight), blood pressure 107/74, and pulse 66. HEENT:  Normal. HEART:  Regular rate and rhythm. LUNGS:  Clear to auscultation bilaterally. ABDOMEN:  Obese, benign.  No palpable hepatosplenomegaly. EXTERNAL GENITALIA:  Mild atrophy.  No lesions.  Her eczema type lesions have resolved.  Vagina again some atrophy.  No lesions.  Cervix no lesions.  Normal discharge.  Uterus upper limits of normal for size, anteverted mobile.  Adnexa nontender.  No masses.  ASSESSMENT/PLAN:  Annual exam.  We checked Pap smear and recommended self-breast and self-vulvar exam.  She will have her mammogram in next month.  She gets her routine health maintenance by Dr. Cathey Endow.  I will refill her prescription or her Temovate when she needs a refill.     Allie Bossier, MD    MCD/MEDQ  D:  05/29/2011  T:  05/30/2011  Job:  [id number]

## 2011-06-11 ENCOUNTER — Ambulatory Visit
Admission: RE | Admit: 2011-06-11 | Discharge: 2011-06-11 | Disposition: A | Discharge status: Home / Self Care | Payer: Managed Care, Other (non HMO) | Source: Ambulatory Visit | Attending: Obstetrics & Gynecology | Admitting: Obstetrics & Gynecology

## 2011-06-11 DIAGNOSIS — Z1231 Encounter for screening mammogram for malignant neoplasm of breast: Secondary | ICD-10-CM

## 2011-06-13 ENCOUNTER — Telehealth: Payer: Self-pay | Admitting: *Deleted

## 2011-06-13 ENCOUNTER — Inpatient Hospital Stay (INDEPENDENT_AMBULATORY_CARE_PROVIDER_SITE_OTHER)
Admission: RE | Admit: 2011-06-13 | Discharge: 2011-06-13 | Disposition: A | Discharge status: Home / Self Care | Payer: Managed Care, Other (non HMO) | Source: Ambulatory Visit | Attending: Emergency Medicine | Admitting: Emergency Medicine

## 2011-06-13 ENCOUNTER — Telehealth (INDEPENDENT_AMBULATORY_CARE_PROVIDER_SITE_OTHER): Payer: Self-pay | Admitting: *Deleted

## 2011-06-13 ENCOUNTER — Encounter: Payer: Self-pay | Admitting: Emergency Medicine

## 2011-06-13 DIAGNOSIS — R109 Unspecified abdominal pain: Secondary | ICD-10-CM

## 2011-06-13 DIAGNOSIS — R197 Diarrhea, unspecified: Secondary | ICD-10-CM | POA: Insufficient documentation

## 2011-06-13 NOTE — Telephone Encounter (Signed)
Pt calls stating she has had diarrhea and stomach cramping x 2 days. Pt denies fever and vomiting. I suggested imodium, fluids, rest and UC today if Sxs get worse. Pt agreed.

## 2011-10-02 ENCOUNTER — Encounter: Payer: Self-pay | Admitting: Emergency Medicine

## 2011-10-02 ENCOUNTER — Inpatient Hospital Stay (INDEPENDENT_AMBULATORY_CARE_PROVIDER_SITE_OTHER)
Admission: RE | Admit: 2011-10-02 | Discharge: 2011-10-02 | Disposition: A | Discharge status: Home / Self Care | Payer: Managed Care, Other (non HMO) | Source: Ambulatory Visit | Attending: Emergency Medicine | Admitting: Emergency Medicine

## 2011-10-02 DIAGNOSIS — N390 Urinary tract infection, site not specified: Secondary | ICD-10-CM

## 2011-10-02 LAB — CONVERTED CEMR LAB
Nitrite: POSITIVE
Specific Gravity, Urine: 1.025
Urobilinogen, UA: 16
pH: 5.5

## 2011-10-04 ENCOUNTER — Telehealth (INDEPENDENT_AMBULATORY_CARE_PROVIDER_SITE_OTHER): Payer: Self-pay | Admitting: *Deleted

## 2011-11-11 NOTE — Progress Notes (Signed)
Summary: Possible UTI Room 4   Vital Signs:  Patient Profile:   55 Years Old Female CC:      Dysuria Height:     64 inches Weight:      249 pounds O2 Sat:      96 % O2 treatment:    Room Air Temp:     98.4 degrees F oral Pulse rate:   76 / minute Pulse rhythm:   regular Resp:     18 per minute BP sitting:   106 / 74  (left arm) Cuff size:   large  Vitals Entered By: Emilio Math (October 02, 2011 9:51 AM)                  Current Allergies: No known allergies History of Present Illness Chief Complaint: Dysuria History of Present Illness: 55 Years Old Female complains of UTI symptoms for 1 days.  She describes the pain as burning during urination.  She has not used any OTC meds. + dysuria No frequency No urgency No hematuria No vaginal discharge No fever/chills No lower abdomenal pain +/- back pain No fatigue   Current Meds PRILOSEC OTC 20 MG TBEC (OMEPRAZOLE MAGNESIUM) Take 1 tab by mouth once daily CIPROFLOXACIN HCL 500 MG TABS (CIPROFLOXACIN HCL) 1 by mouth two times a day for 5 days  REVIEW OF SYSTEMS Constitutional Symptoms      Denies fever, chills, night sweats, weight loss, weight gain, and fatigue.  Eyes       Denies change in vision, eye pain, eye discharge, glasses, contact lenses, and eye surgery. Ear/Nose/Throat/Mouth       Denies hearing loss/aids, change in hearing, ear pain, ear discharge, dizziness, frequent runny nose, frequent nose bleeds, sinus problems, sore throat, hoarseness, and tooth pain or bleeding.  Respiratory       Denies dry cough, productive cough, wheezing, shortness of breath, asthma, bronchitis, and emphysema/COPD.  Cardiovascular       Denies murmurs, chest pain, and tires easily with exhertion.    Gastrointestinal       Denies stomach pain, nausea/vomiting, diarrhea, constipation, blood in bowel movements, and indigestion. Genitourniary       Complains of painful urination.      Denies kidney stones and loss of urinary  control. Neurological       Denies paralysis, seizures, and fainting/blackouts. Musculoskeletal       Denies muscle pain, joint pain, joint stiffness, decreased range of motion, redness, swelling, muscle weakness, and gout.  Skin       Denies bruising, unusual mles/lumps or sores, and hair/skin or nail changes.  Psych       Denies mood changes, temper/anger issues, anxiety/stress, speech problems, depression, and sleep problems.  Past History:  Past Medical History: Reviewed history from 05/22/2009 and no changes required. obesity GERD  gyn: DR Margo Aye, pap 12-2008 colonoscopy done 2006 -- normal with EGD  Past Surgical History: Reviewed history from 07/11/2009 and no changes required. kidney stone removal 1978 heart cath 2006, normal D&C 2008; hysteroscopy for endometrial polyp  Family History: Reviewed history from 05/22/2009 and no changes required. father alive, AMI at 74, stroke in 83s, high chol, HTN mother HTN brother healthy  Social History: Reviewed history from 05/22/2009 and no changes required. 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  Has 2 grown children. son local, daughter in [place] with 5 grandchildren. Denies ETOH. Never smoked. Walks on treadmill 30 min 4 x a wk. Physical Exam General  appearance: well developed, well nourished, no acute distress Abdomen: soft, non-tender without obvious organomegaly Back: No flank or CVA tenderness MSE: oriented to time, place, and person Assessment New Problems: URINARY TRACT INFECTION (ICD-599.0)   Patient Education: Patient and/or caregiver instructed in the following: rest, fluids.  Plan New Medications/Changes: CIPROFLOXACIN HCL 500 MG TABS (CIPROFLOXACIN HCL) 1 by mouth two times a day for 5 days  #10 x 0, 10/02/2011, Hoyt Koch MD  New Orders: Est. Patient Level III [phone] UA Dipstick w/o Micro (automated)  [81003] T-Culture, Urine [[id number]] Planning  Comments:   Urine culture pending Follow-up with your primary care physician if not improving or if getting worse     The patient and/or caregiver has been counseled thoroughly with regard to medications prescribed including dosage, schedule, interactions, rationale for use, and possible side effects and they verbalize understanding.  Diagnoses and expected course of recovery discussed and will return if not improved as expected or if the condition worsens. Patient and/or caregiver verbalized understanding.  Prescriptions: CIPROFLOXACIN HCL 500 MG TABS (CIPROFLOXACIN HCL) 1 by mouth two times a day for 5 days  #10 x 0   Entered and Authorized by:   Hoyt Koch MD   Signed by:   Hoyt Koch MD on 10/02/2011   Method used:   Print then Give to Patient   RxID:   [id number]   Orders Added: 1)  Est. Patient Level III [78469] 2)  UA Dipstick w/o Micro (automated)  [81003] 3)  T-Culture, Urine [[id number]]    Laboratory Results   Urine Tests  Date/Time Received: October 02, 2011 10:01 AM  Date/Time Reported: October 02, 2011 10:01 AM   Routine Urinalysis   Color: orange Appearance: Hazy Glucose: trace   (Normal Range: Negative) Bilirubin: 1+   (Normal Range: Negative) Ketone: trace (5)   (Normal Range: Negative) Spec. Gravity: 1.025   (Normal Range: 1.003-1.035) Blood: trace-intact   (Normal Range: Negative) pH: 5.5   (Normal Range: 5.0-8.0) Protein: trace   (Normal Range: Negative) Urobilinogen: 16   (Normal Range: 0-1) Nitrite: positive   (Normal Range: Negative) Leukocyte Esterace: trace   (Normal Range: Negative)

## 2011-11-11 NOTE — Telephone Encounter (Signed)
  Phone Note Outgoing Call Call back at [facility] Phone [phone]   Call placed by: Lajean Saver RN,  October 04, 2011 4:08 PM Call placed to: Patient Summary of Call: Callback: Patient reports improvement in symptoms. Will complete antibiotic.

## 2011-11-11 NOTE — Progress Notes (Signed)
Summary: diarrhea/abd cramping (room 4)   Vital Signs:  Patient Profile:   55 Years Old Female CC:      Diarrhea and abdominal cramping x 72 hours Height:     64 inches Weight:      240.75 pounds O2 Sat:      98 % O2 treatment:    Room Air Temp:     97.7 degrees F oral Pulse rate:   98 / minute Resp:     16 per minute BP sitting:   102 / 66  (left arm) Cuff size:   large  Pt. in pain?   yes    Location:   abdominal cramping  Vitals Entered By: Lavell Islam RN (June 13, 2011 1:47 PM)                   Updated Prior Medication List: PRILOSEC OTC 20 MG TBEC (OMEPRAZOLE MAGNESIUM) Take 1 tab by mouth once daily  Current Allergies (reviewed today): No known allergies History of Present Illness History from: patient Chief Complaint: Diarrhea and abdominal cramping x 72 hours History of Present Illness: Diarrhea and abd cramping for 3 days.  No N/V or constipation.  No F/C.  Imodium helps.  She has been trying to eat a bland diet which is helping.  No spoiled foods, no recent travel.  She is staying hydrated.  No other concerns.  She has had kidney stones in the past but none recently.  No URI or UTI symptoms.  REVIEW OF SYSTEMS Constitutional Symptoms      Denies fever, chills, night sweats, weight loss, weight gain, and fatigue.  Eyes       Denies change in vision, eye pain, eye discharge, glasses, contact lenses, and eye surgery. Ear/Nose/Throat/Mouth       Denies hearing loss/aids, change in hearing, ear pain, ear discharge, dizziness, frequent runny nose, frequent nose bleeds, sinus problems, sore throat, hoarseness, and tooth pain or bleeding.  Respiratory       Denies dry cough, productive cough, wheezing, shortness of breath, asthma, bronchitis, and emphysema/COPD.  Cardiovascular       Denies murmurs, chest pain, and tires easily with exhertion.    Gastrointestinal       Complains of stomach pain and diarrhea.      Denies nausea/vomiting, constipation, blood in  bowel movements, and indigestion. Genitourniary       Denies painful urination, kidney stones, and loss of urinary control. Neurological       Denies paralysis, seizures, and fainting/blackouts. Musculoskeletal       Denies muscle pain, joint pain, joint stiffness, decreased range of motion, redness, swelling, muscle weakness, and gout.  Skin       Denies bruising, unusual mles/lumps or sores, and hair/skin or nail changes.  Psych       Denies mood changes, temper/anger issues, anxiety/stress, speech problems, depression, and sleep problems. 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  Has 2 grown children. son local, daughter in [place] with 5 grandchildren. Denies ETOH. Never smoked. Walks on treadmill 30 min 4 x a wk.  Past Medical History: Reviewed history from 05/22/2009 and no changes required. obesity GERD  gyn: DR Margo Aye, pap 12-2008 colonoscopy done 2006 --  normal with EGD  Past Surgical History: Reviewed history from 07/11/2009 and no changes required. kidney stone removal 1978 heart cath 2006, normal D&C 2008; hysteroscopy for endometrial polyp  Family History: Reviewed history from 05/22/2009 and no changes required. father alive, AMI at 44, stroke in 65s, high chol, HTN mother HTN brother healthy  Social History: Reviewed history from 05/22/2009 and no changes required. 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  Has 2 grown children. son local, daughter in [place] with 5 grandchildren. Denies ETOH. Never smoked. Walks on treadmill 30 min 4 x a wk. Physical Exam General appearance: well developed, well nourished, no acute  distress Heart: regular rate and  rhythm, no murmur Abdomen: positive epigastric tenderness, abdomen soft without obvious organomegaly, no guarding, no rebound MSE: oriented to time, place, and person Assessment New Problems: DIARRHEA (ICD-787.91) ABDOMINAL PAIN (ICD-789.00)   Plan New Medications/Changes: BENTYL 10 MG CAPS (DICYCLOMINE HCL) 1 by mouth up to q6 hrs as needed for abdominal cramps  #18 x 0, 06/13/2011, Hoyt Koch MD LOMOTIL 2.5-0.025 MG TABS (DIPHENOXYLATE-ATROPINE) 2 tabs by mouth up to 4x per day as needed for diarrhea  #24 x 0, 06/13/2011, Hoyt Koch MD  New Orders: Est. Patient Level III [phone] Planning Comments:   Parke Simmers diet, stay hydrated.  Rx for Lomotil + Bentyl for her symptoms.  Should gradually be getting better.  If any worsening, RLQ pain, F/C/N/V, contact a physician.  Consider labs at that time or further exam/treatment.   The patient and/or caregiver has been counseled thoroughly with regard to medications prescribed including dosage, schedule, interactions, rationale for use, and possible side effects and they verbalize understanding.  Diagnoses and expected course of recovery discussed and will return if not improved as expected or if the condition worsens. Patient and/or caregiver verbalized understanding.  Prescriptions: BENTYL 10 MG CAPS (DICYCLOMINE HCL) 1 by mouth up to q6 hrs as needed for abdominal cramps  #18 x 0   Entered and Authorized by:   Hoyt Koch MD   Signed by:   Hoyt Koch MD on 06/13/2011   Method used:   Print then Give to Patient   RxID:   [id number] LOMOTIL 2.5-0.025 MG TABS (DIPHENOXYLATE-ATROPINE) 2 tabs by mouth up to 4x per day as needed for diarrhea  #24 x 0   Entered and Authorized by:   Hoyt Koch MD   Signed by:   Hoyt Koch MD on 06/13/2011   Method used:   Print then Give to Patient   RxID:   [id number]   Orders Added: 1)  Est. Patient Level III [02725]

## 2011-11-11 NOTE — Telephone Encounter (Signed)
  Phone Note Outgoing Call   Call placed by: Lajean Saver RN,  June 13, 2011 2:36 PM Call [facility]: [facility]  Summary of Call: Bentyl and Lomotil called in to [facility] on [address] per patient's request.

## 2012-03-02 ENCOUNTER — Encounter: Payer: Self-pay | Admitting: *Deleted

## 2012-03-02 ENCOUNTER — Telehealth: Payer: Self-pay | Admitting: *Deleted

## 2012-03-02 NOTE — Telephone Encounter (Signed)
Pt called requesting a RF on her Clobetasol 0.05% cream.

## 2012-06-30 ENCOUNTER — Other Ambulatory Visit: Payer: Self-pay | Admitting: Obstetrics & Gynecology

## 2012-06-30 DIAGNOSIS — Z1231 Encounter for screening mammogram for malignant neoplasm of breast: Secondary | ICD-10-CM

## 2012-07-07 ENCOUNTER — Ambulatory Visit (INDEPENDENT_AMBULATORY_CARE_PROVIDER_SITE_OTHER): Payer: Managed Care, Other (non HMO)

## 2012-07-07 DIAGNOSIS — Z1231 Encounter for screening mammogram for malignant neoplasm of breast: Secondary | ICD-10-CM

## 2012-07-15 ENCOUNTER — Encounter: Payer: Self-pay | Admitting: Obstetrics & Gynecology

## 2012-07-15 ENCOUNTER — Ambulatory Visit (INDEPENDENT_AMBULATORY_CARE_PROVIDER_SITE_OTHER): Payer: Managed Care, Other (non HMO) | Admitting: Obstetrics & Gynecology

## 2012-07-15 VITALS — BP 114/63 | HR 73 | Temp 98.6°F | Resp 16 | Ht 64.0 in | Wt 248.0 lb

## 2012-07-15 DIAGNOSIS — Z124 Encounter for screening for malignant neoplasm of cervix: Secondary | ICD-10-CM

## 2012-07-15 DIAGNOSIS — Z01419 Encounter for gynecological examination (general) (routine) without abnormal findings: Secondary | ICD-10-CM

## 2012-07-15 DIAGNOSIS — Z0000 Encounter for general adult medical examination without abnormal findings: Secondary | ICD-10-CM

## 2012-07-15 MED ORDER — CLOBETASOL PROPIONATE 0.05 % EX CREA: TOPICAL_CREAM | CUTANEOUS | Status: DC

## 2012-07-15 NOTE — Progress Notes (Signed)
Subjective:    Sara Sanford is a 56 y.o. female who presents for an annual exam. The patient has no complaints today. She needs a refill of her temovate.The patient is sexually active. She occasionally uses lubrication.GYN screening history: last pap: was normal. The patient wears seatbelts: yes. The patient participates in regular exercise: yes. (20 minutes per day)Has the patient ever been transfused or tattooed?: no. The patient reports that there is not domestic violence in her life.   Menstrual History: OB History    Grav Para Term Preterm Abortions TAB SAB Ect Mult Living   4 3   1  1          Menarche age: 26 No LMP recorded. Patient is postmenopausal.    The following portions of the patient's history were reviewed and updated as appropriate: allergies, current medications, past family history, past medical history, past social history, past surgical history and problem list.  Review of Systems A comprehensive review of systems was negative. Her mammogram was normal last month.   Objective:    BP 114/63  Pulse 73  Temp 98.6 F (37 C) (Oral)  Resp 16  Ht 5\' 4"  (1.626 m)  Wt 248 lb (112.492 kg)  BMI 42.57 kg/m2  General Appearance:    Alert, cooperative, no distress, appears stated age  Head:    Normocephalic, without obvious abnormality, atraumatic  Eyes:    PERRL, conjunctiva/corneas clear, EOM's intact, fundi    benign, both eyes  Ears:    Normal TM's and external ear canals, both ears  Nose:   Nares normal, septum midline, mucosa normal, no drainage    or sinus tenderness  Throat:   Lips, mucosa, and tongue normal; teeth and gums normal  Neck:   Supple, symmetrical, trachea midline, no adenopathy;    thyroid:  no enlargement/tenderness/nodules; no carotid   bruit or JVD  Back:     Symmetric, no curvature, ROM normal, no CVA tenderness  Lungs:     Clear to auscultation bilaterally, respirations unlabored  Chest Wall:    No tenderness or deformity   Heart:     Regular rate and rhythm, S1 and S2 normal, no murmur, rub   or gallop  Breast Exam:    No tenderness, masses, or nipple abnormality  Abdomen:     Soft, non-tender, bowel sounds active all four quadrants,    no masses, no organomegaly  Genitalia:    Normal female without lesion, discharge or tenderness, moderate atrophy, no evidence of malignancy, NSSA, NT, no adnexal masses     Extremities:   Extremities normal, atraumatic, no cyanosis or edema  Pulses:   2+ and symmetric all extremities  Skin:   Skin color, texture, turgor normal, no rashes or lesions  Lymph nodes:   Cervical, supraclavicular, and axillary nodes normal  Neurologic:   CNII-XII intact, normal strength, sensation and reflexes    throughout  .    Assessment:    Healthy female exam.    Plan:     Pap smear.  Refill clobetasol

## 2012-08-03 ENCOUNTER — Encounter: Payer: Self-pay | Admitting: Family Medicine

## 2012-08-03 ENCOUNTER — Ambulatory Visit (INDEPENDENT_AMBULATORY_CARE_PROVIDER_SITE_OTHER): Payer: Managed Care, Other (non HMO) | Admitting: Family Medicine

## 2012-08-03 VITALS — BP 122/82 | HR 61 | Wt 246.0 lb

## 2012-08-03 DIAGNOSIS — Z0000 Encounter for general adult medical examination without abnormal findings: Secondary | ICD-10-CM

## 2012-08-03 DIAGNOSIS — K219 Gastro-esophageal reflux disease without esophagitis: Secondary | ICD-10-CM | POA: Insufficient documentation

## 2012-08-03 DIAGNOSIS — Z23 Encounter for immunization: Secondary | ICD-10-CM

## 2012-08-03 DIAGNOSIS — R220 Localized swelling, mass and lump, head: Secondary | ICD-10-CM

## 2012-08-03 DIAGNOSIS — R221 Localized swelling, mass and lump, neck: Secondary | ICD-10-CM

## 2012-08-03 NOTE — Patient Instructions (Signed)
Start a regular exercise program and make sure you are eating a healthy diet Try to eat 4 servings of dairy  Your vaccines are up to date.

## 2012-08-03 NOTE — Progress Notes (Signed)
  Subjective:     Sara Sanford is a 56 y.o. female and is here for a comprehensive physical exam. The patient reports no problems.  History   Social History  . Marital Status: Married    Spouse Name: N/A    Number of Children: 2  . Years of Education: N/A   Occupational History  . Waitress    Social History Main Topics  . Smoking status: Never Smoker   . Smokeless tobacco: Never Used  . Alcohol Use: No  . Drug Use: No  . Sexually Active: Yes -- Female partner(s)   Other Topics Concern  . Not on file   Social History Narrative   2 mile walk 4 times a week.     Health Maintenance  Topic Date Due  . Influenza Vaccine  09/08/2012  . Mammogram  07/07/2014  . Pap Smear  07/16/2015  . Colonoscopy  12/09/2020  . Tetanus/tdap  08/03/2022    The following portions of the patient's history were reviewed and updated as appropriate: allergies, current medications, past family history, past medical history, past social history, past surgical history and problem list.  Review of Systems A comprehensive review of systems was negative.   Objective:    BP 122/82  Pulse 61  Wt 246 lb (111.585 kg) General appearance: Alert, cooperative, appears stated age. Moderately obese. Head: Normocephalic, without obvious abnormality, atraumatic Eyes: conj clear, EOM, PEERLA Ears: normal TM's and external ear canals both ears Nose: Nares normal. Septum midline. Mucosa normal. No drainage or sinus tenderness. Throat: lips, mucosa, and tongue normal; teeth and gums normal and mild swelling on the right just above and lateral to the thyroid.  Neck: no adenopathy, no carotid bruit, no JVD, supple, symmetrical, trachea midline and thyroid not enlarged, symmetric, no tenderness/mass/nodules Back: symmetric, no curvature. ROM normal. No CVA tenderness. Lungs: clear to auscultation bilaterally Heart:RRR, no murmur Abdomen: soft, non-tender; bowel sounds normal; no masses,  no organomegaly Extremities:  extremities normal, atraumatic, no cyanosis or edema Pulses: 2+ and symmetric Skin: Skin color, texture, turgor normal. No rashes or lesions Lymph nodes: Cervical, supraclavicular, and axillary nodes normal. Neurologic: Alert and oriented X 3, normal strength and tone. Normal symmetric reflexes. Normal coordination and gait    Assessment:    Healthy female exam.      Plan:     See After Visit Summary for Counseling Recommendations  Start a regular exercise program and make sure you are eating a healthy diet Try to eat 4 servings of dairy a day Your vaccines are up to date.  Due for screening CMP and lipid panel.   GERD - We did discuss trying to wean down off of her PPI. We did discuss increased risk of fractures on long-term medication. She says she's been on it for years. She says even when she has tried to stop she starts to develop more reflux and cough. We also reviewed diet for reflux. She still drinks carbonated drinks but not often as well as caffeinated beverages and chocolate.  Her Pap smear and mammogram are up-to-date.  Tdap given today.  Neck swelling-she does have a lot of swelling on the right compared to left. Unable to palpate a distinct lymph node. This may just be anatomical but open ultrasound for further evaluation as well as check a TSH.

## 2012-08-05 ENCOUNTER — Ambulatory Visit (HOSPITAL_BASED_OUTPATIENT_CLINIC_OR_DEPARTMENT_OTHER)
Admission: RE | Admit: 2012-08-05 | Discharge: 2012-08-05 | Disposition: A | Discharge status: Home / Self Care | Payer: Managed Care, Other (non HMO) | Source: Ambulatory Visit | Attending: Family Medicine | Admitting: Family Medicine

## 2012-08-05 DIAGNOSIS — R221 Localized swelling, mass and lump, neck: Secondary | ICD-10-CM

## 2012-08-05 DIAGNOSIS — R220 Localized swelling, mass and lump, head: Secondary | ICD-10-CM | POA: Insufficient documentation

## 2012-08-07 LAB — COMPLETE METABOLIC PANEL WITH GFR
ALT: 15 U/L (ref 0–35)
AST: 17 U/L (ref 0–37)
Alkaline Phosphatase: 74 U/L (ref 39–117)
CO2: 24 mEq/L (ref 19–32)
Creat: 0.67 mg/dL (ref 0.50–1.10)
GFR, Est African American: >89 mL/min
Sodium: 142 mEq/L (ref 135–145)
Total Bilirubin: 0.5 mg/dL (ref 0.3–1.2)
Total Protein: 6.4 g/dL (ref 6.0–8.3)

## 2012-08-07 LAB — LIPID PANEL
Total CHOL/HDL Ratio: 3.8 Ratio
Triglycerides: 66 mg/dL (ref <150)

## 2012-10-16 ENCOUNTER — Other Ambulatory Visit: Payer: Self-pay | Admitting: *Deleted

## 2013-01-13 ENCOUNTER — Encounter: Payer: Self-pay | Admitting: Obstetrics & Gynecology

## 2013-01-13 ENCOUNTER — Ambulatory Visit (INDEPENDENT_AMBULATORY_CARE_PROVIDER_SITE_OTHER): Payer: Managed Care, Other (non HMO) | Admitting: Obstetrics & Gynecology

## 2013-01-13 VITALS — BP 117/80 | HR 64 | Temp 96.8°F | Resp 16 | Ht 63.0 in | Wt 247.0 lb

## 2013-01-13 DIAGNOSIS — G8929 Other chronic pain: Secondary | ICD-10-CM

## 2013-01-13 DIAGNOSIS — N949 Unspecified condition associated with female genital organs and menstrual cycle: Secondary | ICD-10-CM

## 2013-01-13 MED ORDER — ESTRADIOL 0.1 MG/GM VA CREA: 4.0000 g | TOPICAL_CREAM | Freq: Every day | VAGINAL | Status: DC

## 2013-01-13 NOTE — Progress Notes (Signed)
  Subjective:    Patient ID: Sara Sanford, female    DOB: 1956/02/17, 57 y.o.   MRN: [id number]  HPI  Sara Sanford is a 57 yo MW lady with biopsy-proven lichen sclerosis. She is using a small amount of clobetasol 3 times per week. She is here today because she is having some external bleeding with sex. She also complains of some pelvic pain.  Review of Systems     Objective:   Physical Exam  Moderate atropy of vulva/vagina but significant improvement of her lichen. No evidence of any infections.      Assessment & Plan:  Pelvic pain- check UA and gyn u/s Vulvar bleeding with sex- due to atrophy is my diagnosis. I have e prescribed estrace vaginal cream.

## 2013-01-21 ENCOUNTER — Ambulatory Visit (HOSPITAL_BASED_OUTPATIENT_CLINIC_OR_DEPARTMENT_OTHER): Admission: RE | Admit: 2013-01-21 | Payer: Managed Care, Other (non HMO) | Source: Ambulatory Visit

## 2013-01-21 ENCOUNTER — Other Ambulatory Visit: Payer: Managed Care, Other (non HMO)

## 2013-01-21 ENCOUNTER — Ambulatory Visit (HOSPITAL_BASED_OUTPATIENT_CLINIC_OR_DEPARTMENT_OTHER): Payer: Managed Care, Other (non HMO)

## 2013-01-25 ENCOUNTER — Encounter: Payer: Self-pay | Admitting: Physician Assistant

## 2013-01-25 ENCOUNTER — Ambulatory Visit (INDEPENDENT_AMBULATORY_CARE_PROVIDER_SITE_OTHER): Payer: Managed Care, Other (non HMO) | Admitting: Physician Assistant

## 2013-01-25 VITALS — BP 102/67 | HR 66 | Wt 251.0 lb

## 2013-01-25 DIAGNOSIS — N390 Urinary tract infection, site not specified: Secondary | ICD-10-CM

## 2013-01-25 DIAGNOSIS — R300 Dysuria: Secondary | ICD-10-CM

## 2013-01-25 LAB — POCT URINALYSIS DIPSTICK
Bilirubin, UA: NEGATIVE
Ketones, UA: NEGATIVE
Leukocytes, UA: NEGATIVE
Nitrite, UA: POSITIVE
Protein, UA: NEGATIVE
pH, UA: 7

## 2013-01-25 MED ORDER — CIPROFLOXACIN HCL 500 MG PO TABS: 500.0000 mg | ORAL_TABLET | Freq: Two times a day (BID) | ORAL | Status: DC

## 2013-01-25 NOTE — Progress Notes (Signed)
  Subjective:    Patient ID: Sara Sanford, female    DOB: [birth year], 57 y.o.   MRN: [id number]  Dysuria  This is a new problem. The current episode started yesterday. The problem occurs every urination. The problem has been gradually worsening. The quality of the pain is described as burning and shooting. The pain is at a severity of 5/10. There has been no fever. She is sexually active. There is no history of pyelonephritis. Associated symptoms include frequency, hesitancy, sweats and urgency. Pertinent negatives include no chills, discharge, flank pain, hematuria, nausea, possible pregnancy or vomiting. She has tried increased fluids and sitz baths for the symptoms. The treatment provided mild relief. She has had numerous UTI's in the past.     Pain and discomofrt with urination. Had to hold urination last night. No fever chills, nausea. nfever   Review of Systems  Constitutional: Negative for chills.  Gastrointestinal: Negative for nausea and vomiting.  Genitourinary: Positive for dysuria, hesitancy, urgency and frequency. Negative for hematuria and flank pain.       Objective:   Physical Exam  Constitutional: She is oriented to person, place, and time. She appears well-developed and well-nourished.  HENT:  Head: Normocephalic and atraumatic.  Cardiovascular: Normal rate, regular rhythm and normal heart sounds.   Pulmonary/Chest: Effort normal and breath sounds normal.  NO CVA tenderness.   Abdominal: Soft. There is tenderness.  Mild tenderness to palpation over bilateral lower abdomen.  Neurological: She is alert and oriented to person, place, and time.  Skin: Skin is warm and dry.  Psychiatric: She has a normal mood and affect. Her behavior is normal.          Assessment & Plan:  UTI- UA positive for nitrates and symptomatic with history of UTI's. Will treat with Cipro. GAve handout with symptomatic advice. Call if not improving.

## 2013-01-25 NOTE — Patient Instructions (Addendum)
Urinary Tract Infection Urinary tract infections (UTIs) can develop anywhere along your urinary tract. Your urinary tract is your body's drainage system for removing wastes and extra water. Your urinary tract includes two kidneys, two ureters, a bladder, and a urethra. Your kidneys are a pair of bean-shaped organs. Each kidney is about the size of your fist. They are located below your ribs, one on each side of your spine. CAUSES Infections are caused by microbes, which are microscopic organisms, including fungi, viruses, and bacteria. These organisms are so small that they can only be seen through a microscope. Bacteria are the microbes that most commonly cause UTIs. SYMPTOMS  Symptoms of UTIs may vary by age and gender of the patient and by the location of the infection. Symptoms in young women typically include a frequent and intense urge to urinate and a painful, burning feeling in the bladder or urethra during urination. Older women and men are more likely to be tired, shaky, and weak and have muscle aches and abdominal pain. A fever may mean the infection is in your kidneys. Other symptoms of a kidney infection include pain in your back or sides below the ribs, nausea, and vomiting. DIAGNOSIS To diagnose a UTI, your caregiver will ask you about your symptoms. Your caregiver also will ask to provide a urine sample. The urine sample will be tested for bacteria and white blood cells. White blood cells are made by your body to help fight infection. TREATMENT  Typically, UTIs can be treated with medication. Because most UTIs are caused by a bacterial infection, they usually can be treated with the use of antibiotics. The choice of antibiotic and length of treatment depend on your symptoms and the type of bacteria causing your infection. HOME CARE INSTRUCTIONS  If you were prescribed antibiotics, take them exactly as your caregiver instructs you. Finish the medication even if you feel better after you  have only taken some of the medication.  Drink enough water and fluids to keep your urine clear or pale yellow.  Avoid caffeine, tea, and carbonated beverages. They tend to irritate your bladder.  Empty your bladder often. Avoid holding urine for long periods of time.  Empty your bladder before and after sexual intercourse.  After a bowel movement, women should cleanse from front to back. Use each tissue only once. SEEK MEDICAL CARE IF:   You have back pain.  You develop a fever.  Your symptoms do not begin to resolve within 3 days. SEEK IMMEDIATE MEDICAL CARE IF:   You have severe back pain or lower abdominal pain.  You develop chills.  You have nausea or vomiting.  You have continued burning or discomfort with urination. MAKE SURE YOU:   Understand these instructions.  Will watch your condition.  Will get help right away if you are not doing well or get worse. Document Released: 09/04/2005 Document Revised: 05/26/2012 Document Reviewed: 01/03/2012 [employer] Patient Information 2013 [employer].  

## 2013-07-19 ENCOUNTER — Ambulatory Visit (INDEPENDENT_AMBULATORY_CARE_PROVIDER_SITE_OTHER): Payer: Managed Care, Other (non HMO) | Admitting: Family Medicine

## 2013-07-19 ENCOUNTER — Encounter: Payer: Self-pay | Admitting: Family Medicine

## 2013-07-19 VITALS — BP 112/65 | HR 59 | Temp 98.0°F | Wt 222.0 lb

## 2013-07-19 DIAGNOSIS — J209 Acute bronchitis, unspecified: Secondary | ICD-10-CM

## 2013-07-19 DIAGNOSIS — J019 Acute sinusitis, unspecified: Secondary | ICD-10-CM

## 2013-07-19 MED ORDER — AMOXICILLIN-POT CLAVULANATE 875-125 MG PO TABS: 1.0000 | ORAL_TABLET | Freq: Two times a day (BID) | ORAL | Status: DC

## 2013-07-19 NOTE — Patient Instructions (Signed)

## 2013-07-19 NOTE — Progress Notes (Signed)
  Subjective:    Patient ID: Sara Sanford, female    DOB: 10/22/1956, 57 y.o.   MRN: [id number]  HPI Sinus congestion and cough x 2 weeks. Has had some nosebleeds.  Had temp to 101 about the first 3 days. + mild ST.  + Post nasal drainage.  Green nasal drainage.  No GI upset.  Tried claritin with no relief. Now using robitussion.  +raspy.      Review of Systems     Objective:   Physical Exam  Constitutional: She is oriented to person, place, and time. She appears well-developed and well-nourished.  HENT:  Head: Normocephalic and atraumatic.  Right Ear: External ear normal.  Left Ear: External ear normal.  Nose: Nose normal.  Mouth/Throat: Oropharynx is clear and moist.  TMs and canals are clear. Bilat maxillary sinus tenderness.   Eyes: Conjunctivae and EOM are normal. Pupils are equal, round, and reactive to light.  Neck: Neck supple. No thyromegaly present.  Cardiovascular: Normal rate, regular rhythm and normal heart sounds.   Pulmonary/Chest: Effort normal and breath sounds normal. She has no wheezes.  Lymphadenopathy:    She has no cervical adenopathy.  Neurological: She is alert and oriented to person, place, and time.  Skin: Skin is warm and dry.  Psychiatric: She has a normal mood and affect.          Assessment & Plan:  Sinsusitis/bronchtis - Will tx with augmentin. Cal if not better in one week. Continue sympomtic care.

## 2013-09-27 ENCOUNTER — Other Ambulatory Visit: Payer: Self-pay | Admitting: Obstetrics & Gynecology

## 2013-09-27 DIAGNOSIS — Z1231 Encounter for screening mammogram for malignant neoplasm of breast: Secondary | ICD-10-CM

## 2013-09-28 ENCOUNTER — Ambulatory Visit: Payer: Managed Care, Other (non HMO) | Admitting: Family Medicine

## 2013-09-28 DIAGNOSIS — Z0289 Encounter for other administrative examinations: Secondary | ICD-10-CM

## 2013-09-29 ENCOUNTER — Ambulatory Visit (INDEPENDENT_AMBULATORY_CARE_PROVIDER_SITE_OTHER): Payer: Managed Care, Other (non HMO) | Admitting: Family Medicine

## 2013-09-29 ENCOUNTER — Other Ambulatory Visit: Payer: Self-pay | Admitting: Family Medicine

## 2013-09-29 ENCOUNTER — Encounter: Payer: Self-pay | Admitting: Family Medicine

## 2013-09-29 VITALS — BP 109/62 | HR 57 | Wt 253.0 lb

## 2013-09-29 DIAGNOSIS — M545 Low back pain, unspecified: Secondary | ICD-10-CM

## 2013-09-29 DIAGNOSIS — R439 Unspecified disturbances of smell and taste: Secondary | ICD-10-CM

## 2013-09-29 DIAGNOSIS — B372 Candidiasis of skin and nail: Secondary | ICD-10-CM

## 2013-09-29 DIAGNOSIS — R140 Abdominal distension (gaseous): Secondary | ICD-10-CM

## 2013-09-29 DIAGNOSIS — R21 Rash and other nonspecific skin eruption: Secondary | ICD-10-CM

## 2013-09-29 DIAGNOSIS — R430 Anosmia: Secondary | ICD-10-CM

## 2013-09-29 DIAGNOSIS — R141 Gas pain: Secondary | ICD-10-CM

## 2013-09-29 DIAGNOSIS — K5900 Constipation, unspecified: Secondary | ICD-10-CM

## 2013-09-29 LAB — CBC WITH DIFFERENTIAL/PLATELET
Basophils Absolute: 0 10*3/uL (ref 0.0–0.1)
Basophils Relative: 0 % (ref 0–1)
Eosinophils Relative: 1 % (ref 0–5)
HCT: 36.6 % (ref 36.0–46.0)
Lymphocytes Relative: 69 % — ABNORMAL HIGH (ref 12–46)
Lymphs Abs: 7.4 10*3/uL — ABNORMAL HIGH (ref 0.7–4.0)
MCH: 31.4 pg (ref 26.0–34.0)
MCHC: 34.2 g/dL (ref 30.0–36.0)
MCV: 92 fL (ref 78.0–100.0)
Monocytes Absolute: 0.2 10*3/uL (ref 0.1–1.0)
Platelets: 234 10*3/uL (ref 150–400)
RDW: 13.8 % (ref 11.5–15.5)
WBC: 10.7 10*3/uL — ABNORMAL HIGH (ref 4.0–10.5)

## 2013-09-29 MED ORDER — FLUTICASONE PROPIONATE 50 MCG/ACT NA SUSP: 2.0000 | Freq: Every day | NASAL | Status: DC

## 2013-09-29 MED ORDER — AZITHROMYCIN 250 MG PO TABS: ORAL_TABLET | ORAL | Status: DC

## 2013-09-29 MED ORDER — NYSTATIN-TRIAMCINOLONE 100000-0.1 UNIT/GM-% EX OINT: TOPICAL_OINTMENT | Freq: Two times a day (BID) | CUTANEOUS | Status: DC

## 2013-09-29 MED ORDER — FLUCONAZOLE 100 MG PO TABS: 100.0000 mg | ORAL_TABLET | Freq: Once | ORAL | Status: DC

## 2013-09-29 NOTE — Patient Instructions (Signed)
Recommend start a stool softener daily. Make sure getting plenty of water and staying hydrated. This helps as well. We will call you back once lab work is available. Revonda Standard of her prescription for oral Diflucan as well as a topical ointments.

## 2013-09-29 NOTE — Progress Notes (Signed)
Subjective:    Patient ID: Sara Sanford, female    DOB: 11-18-1956, 57 y.o.   MRN: [id number]  HPI Feeling lot of pressure like has gas or needs to have a BM for about 1 month.  Had 3 BMs this AM.  Thinks she has hemorrhoids. Says will occ bleed and her rectal area has bene very itchy itch.  Today feels better but has had BM today.  Some stools are more hard.  Has seen some abnormal shapes.  No rash near the rectal area.  No sig history of contstipation in the past.  Says usually has 2 BMs per day.  Some of the pressure is in her low back.  No hx of thyroid problems.  Last colonoscopy was in 2012 at [facility].    Loss of smell since had a sinus infection 2 monthss ago. She did complete her antibiotics.  Still has some mild nasal congestion. No fever. No facial pain or pressure or headaches. She feels like can't taste well either.  She says she can really only take something if it's very salty or very sweet but otherwise can't taste very well at all. No history of head trauma.  Review of Systems BP 109/62  Pulse 57  Wt 253 lb (114.76 kg)  BMI 44.83 kg/m2    Allergies  Allergen Reactions  . Darvon [Propoxyphene] Other (See Comments)    Hallucinations    Past Medical History  Diagnosis Date  . Heart burn     peptic ulcer  . Obesity   . Post-menopausal   . Lichen sclerosus     Past Surgical History  Procedure Laterality Date  . Dilation and curettage of uterus    . Hysteroscopy w/d&c  2008  . Cardiac catheterization  2008  . Kidney stone surgery  1977    History   Social History  . Marital Status: Married    Spouse Name: N/A    Number of Children: 2  . Years of Education: N/A   Occupational History  . Waitress    Social History Main Topics  . Smoking status: Never Smoker   . Smokeless tobacco: Never Used  . Alcohol Use: No  . Drug Use: No  . Sexual Activity: Yes    Partners: Male   Other Topics Concern  . Not on file   Social History Narrative   2 mile  walk 4 times a week.      Family History  Problem Relation Age of Onset  . Breast cancer Paternal Aunt     pm breast CA  . Breast cancer Paternal Aunt     PM breast CA  . Heart attack Father   . Hypertension Mother   . Hypertension Father     Outpatient Encounter Prescriptions as of 09/29/2013  Medication Sig Dispense Refill  . Ascorbic Acid (VITAMIN C) 100 MG tablet Take 100 mg by mouth daily.      Marland Kitchen aspirin 81 MG tablet Take 81 mg by mouth daily.      . cholecalciferol (VITAMIN D) 1000 UNITS tablet Take 1,000 Units by mouth daily.      . clobetasol cream (TEMOVATE) 0.05 % Use vaginally every other night as directed  Disp a 60gm tube Apply 2-3 times per week  30 g  6  . fish oil-omega-3 fatty acids 1000 MG capsule Take 2 g by mouth daily.      Marland Kitchen omeprazole (PRILOSEC) 20 MG capsule Take 20 mg by mouth daily.      Marland Kitchen  azithromycin (ZITHROMAX) 250 MG tablet 2 tabs Day 1 then one a day x 4 days.  6 each  0  . fluconazole (DIFLUCAN) 100 MG tablet Take 1 tablet (100 mg total) by mouth once.  1 tablet  0  . fluticasone (FLONASE) 50 MCG/ACT nasal spray Place 2 sprays into the nose daily.  16 g  1  . nystatin-triamcinolone ointment (MYCOLOG) Apply topically 2 (two) times daily.  30 g  0  . [DISCONTINUED] amoxicillin-clavulanate (AUGMENTIN) 875-125 MG per tablet Take 1 tablet by mouth 2 (two) times daily.  20 tablet  0   No facility-administered encounter medications on file as of 09/29/2013.       \\   Objective:   Physical Exam  Constitutional: She is oriented to person, place, and time. She appears well-developed and well-nourished.  HENT:  Head: Normocephalic and atraumatic.  Abdominal: Soft. Bowel sounds are normal. She exhibits no distension and no mass. There is no tenderness. There is no rebound and no guarding.  Genitourinary:  She does have an erythematous rash around the buttock area as well as some cracking of the skin along the buttock crease. No external hemorrhoids.  Normal rectal tone. No masses in the rectal vault.  Neurological: She is alert and oriented to person, place, and time.  Skin: Skin is warm and dry.  Psychiatric: She has a normal mood and affect. Her behavior is normal.          Assessment & Plan:  Constipation- will check thyroid to make sure that she has not developed hypothyroidism since this is new onset for her. We'll also check blood counts she's having some low back pain with it. She may need to start a stool softener daily to keep herself more regular. I did not feel any rectal masses on exam.  Perineal rash- Most Likely fungal since it is itchy. We'll treat with one oral Diflucan tab as well as topical nystatin/triamcinolone. If she is not noticing significant improvement in 2 weeks then please let me know.  Loss of smell- this can sometimes happen after acute viral sinusitis. She's still having significant congestion 2 months later after being treated for sinusitis I would like to repeat a round of antibiotics. We'll give her a Z-Pak and have her add nasal steroid spray, fluticasone. If she's not noticing improvement over 2 weeks then please let you know. We could consider a CT scan of the sinuses or possible referral to ear nose and throat for further evaluation.

## 2013-09-30 LAB — PATHOLOGIST SMEAR REVIEW

## 2013-10-01 LAB — GLIA (IGA/G) + TTG IGA
Gliadin IgA: 6.2 U/mL (ref <20)
Tissue Transglutaminase Ab, IgA: 4.8 U/mL (ref <20)

## 2013-10-07 ENCOUNTER — Ambulatory Visit (INDEPENDENT_AMBULATORY_CARE_PROVIDER_SITE_OTHER): Payer: Managed Care, Other (non HMO)

## 2013-10-07 DIAGNOSIS — Z1231 Encounter for screening mammogram for malignant neoplasm of breast: Secondary | ICD-10-CM

## 2013-11-11 ENCOUNTER — Telehealth: Payer: Self-pay | Admitting: *Deleted

## 2013-11-11 ENCOUNTER — Encounter: Payer: Self-pay | Admitting: Family Medicine

## 2013-11-11 ENCOUNTER — Ambulatory Visit (INDEPENDENT_AMBULATORY_CARE_PROVIDER_SITE_OTHER): Payer: Managed Care, Other (non HMO) | Admitting: Family Medicine

## 2013-11-11 VITALS — BP 116/60 | HR 66 | Temp 97.8°F | Ht 63.0 in | Wt 251.0 lb

## 2013-11-11 DIAGNOSIS — R439 Unspecified disturbances of smell and taste: Secondary | ICD-10-CM

## 2013-11-11 DIAGNOSIS — J3489 Other specified disorders of nose and nasal sinuses: Secondary | ICD-10-CM

## 2013-11-11 DIAGNOSIS — K5900 Constipation, unspecified: Secondary | ICD-10-CM

## 2013-11-11 DIAGNOSIS — R0981 Nasal congestion: Secondary | ICD-10-CM

## 2013-11-11 DIAGNOSIS — R430 Anosmia: Secondary | ICD-10-CM

## 2013-11-11 NOTE — Telephone Encounter (Signed)
PA for [facility] obtained. Auth # [id number].  Meyer Cory, LPN

## 2013-11-11 NOTE — Progress Notes (Signed)
   Subjective:    Patient ID: Sara Sanford, female    DOB: 1956/11/12, 57 y.o.   MRN: [id number]  HPI Still some congested. Tx for sinusitis about 1 month ago and at that time had already been congested for 2 months prior.  She still can't smell normally but does often smell a burning campfire smell.  No fever or chills or sweats.  No significant cough fevers chills or sweats.  Peineal rash is better with the cream.   Constipation is improved.  Not on any medications. She says it has gradually gotten better on its own.  Review of Systems     Objective:   Physical Exam  Constitutional: She is oriented to person, place, and time. She appears well-developed and well-nourished.  HENT:  Head: Normocephalic and atraumatic.  Right Ear: External ear normal.  Left Ear: External ear normal.  Nose: Nose normal.  Mouth/Throat: Oropharynx is clear and moist.  TMs and canals are clear.   Eyes: Conjunctivae and EOM are normal. Pupils are equal, round, and reactive to light.  Neck: Neck supple. No thyromegaly present.  Cardiovascular: Normal rate, regular rhythm and normal heart sounds.   Pulmonary/Chest: Effort normal and breath sounds normal. She has no wheezes.  Lymphadenopathy:    She has no cervical adenopathy.  Neurological: She is alert and oriented to person, place, and time.  Skin: Skin is warm and dry. No pallor.  Psychiatric: She has a normal mood and affect. Her behavior is normal.          Assessment & Plan:  Congestion and loss of smell. I'm still concerned about her loss of smell. I think the next appropriate limited sinus CT. We will order this. If it is normal then refer to ENT for further evaluation. She might need a culture of her sinuses to rule out any type of fungal infection.  Perineal rash - much improved.  Constipation-much improved.

## 2013-11-12 LAB — CBC WITH DIFFERENTIAL/PLATELET
Basophils Relative: 0 % (ref 0–1)
Eosinophils Absolute: 0.1 10*3/uL (ref 0.0–0.7)
Eosinophils Relative: 1 % (ref 0–5)
Lymphocytes Relative: 69 % — ABNORMAL HIGH (ref 12–46)
Lymphs Abs: 6.8 10*3/uL — ABNORMAL HIGH (ref 0.7–4.0)
MCH: 31.5 pg (ref 26.0–34.0)
MCHC: 34.6 g/dL (ref 30.0–36.0)
MCV: 90.8 fL (ref 78.0–100.0)
Neutrophils Relative %: 26 % — ABNORMAL LOW (ref 43–77)
Platelets: 225 10*3/uL (ref 150–400)
RBC: 4.26 MIL/uL (ref 3.87–5.11)
RDW: 13.7 % (ref 11.5–15.5)

## 2013-12-09 HISTORY — PX: GALLBLADDER SURGERY: SHX652

## 2014-02-02 ENCOUNTER — Encounter: Payer: Self-pay | Admitting: Family Medicine

## 2014-02-02 ENCOUNTER — Ambulatory Visit (INDEPENDENT_AMBULATORY_CARE_PROVIDER_SITE_OTHER): Payer: BC Managed Care – PPO | Admitting: Family Medicine

## 2014-02-02 VITALS — BP 128/72 | HR 59 | Temp 97.5°F | Ht 63.6 in | Wt 256.0 lb

## 2014-02-02 DIAGNOSIS — A499 Bacterial infection, unspecified: Secondary | ICD-10-CM

## 2014-02-02 DIAGNOSIS — R300 Dysuria: Secondary | ICD-10-CM

## 2014-02-02 DIAGNOSIS — N390 Urinary tract infection, site not specified: Secondary | ICD-10-CM

## 2014-02-02 DIAGNOSIS — J329 Chronic sinusitis, unspecified: Secondary | ICD-10-CM

## 2014-02-02 DIAGNOSIS — B9689 Other specified bacterial agents as the cause of diseases classified elsewhere: Secondary | ICD-10-CM

## 2014-02-02 LAB — POCT URINALYSIS DIPSTICK
Bilirubin, UA: NEGATIVE
Blood, UA: NEGATIVE
Glucose, UA: 100
Ketones, UA: NEGATIVE
Nitrite, UA: POSITIVE
Protein, UA: 30
Spec Grav, UA: 1.025
Urobilinogen, UA: 1
pH, UA: 7

## 2014-02-02 MED ORDER — SULFAMETHOXAZOLE-TRIMETHOPRIM 800-160 MG PO TABS: ORAL_TABLET | ORAL | Status: DC

## 2014-02-02 NOTE — Progress Notes (Signed)
CC: Sara Sanford is a 58 y.o. female is here for Urinary Tract Infection   Subjective: HPI:  Complains of urinary urgency frequency and dysuria all of which is moderate severity with acute onset that began late last night. Interventions have included Uristat and increasing fluids which has slightly improved her symptoms. She denies any fevers, chills, genitourinary complaints other than above. Denies nausea, flank pain, nor GI disturbance. Symptoms have been persistent since onset  She mentions that she's also had nasal congestion and facial pressure beneath both eyes that has been present off and on since the summer but persistent for the past month. Accompanied by subjective postnasal drip occasional dizziness and decreased smell. No interventions as of yet. Denies motor or sensory disturbances other that described above     Review Of Systems Outlined In HPI  Past Medical History  Diagnosis Date  . Heart burn     peptic ulcer  . Obesity   . Post-menopausal   . Lichen sclerosus     Past Surgical History  Procedure Laterality Date  . Dilation and curettage of uterus    . Hysteroscopy w/d&c  2008  . Cardiac catheterization  2008  . Kidney stone surgery  1977   Family History  Problem Relation Age of Onset  . Breast cancer Paternal Aunt     pm breast CA  . Breast cancer Paternal Aunt     PM breast CA  . Heart attack Father   . Hypertension Mother   . Hypertension Father     History   Social History  . Marital Status: Married    Spouse Name: N/A    Number of Children: 2  . Years of Education: N/A   Occupational History  . Waitress    Social History Main Topics  . Smoking status: Never Smoker   . Smokeless tobacco: Never Used  . Alcohol Use: No  . Drug Use: No  . Sexual Activity: Yes    Partners: Male   Other Topics Concern  . Not on file   Social History Narrative   2 mile walk 4 times a week.       Objective: BP 128/72  Pulse 59  Temp(Src) 97.5  F (36.4 C)  Ht 5' 3.6" (1.615 m)  Wt 256 lb (116.121 kg)  BMI 44.52 kg/m2  SpO2 98%  General: Alert and Oriented, No Acute Distress HEENT: Pupils equal, round, reactive to light. Conjunctivae clear.  External ears unremarkable, canals clear with intact TMs with appropriate landmarks.  Middle ear appears open without effusion. Pink inferior turbinates.  Moist mucous membranes, pharynx without inflammation nor lesionsHowever moderate cobblestoning.  Neck supple without palpable lymphadenopathy nor abnormal masses. Lungs:  clear and comfortable work of breathing  Cardiac: Regular rate and rhythm.   back: No CVA tenderness  Mental Status: No depression, anxiety, nor agitation. Skin: Warm and dry.  Assessment & Plan: Sara was seen today for urinary tract infection.  Diagnoses and associated orders for this visit:  Dysuria - POCT urinalysis dipstick - Urine Culture  Acute UTI - sulfamethoxazole-trimethoprim (SEPTRA DS) 800-160 MG per tablet; One by mouth twice a day for ten days.  Bacterial sinusitis - sulfamethoxazole-trimethoprim (SEPTRA DS) 800-160 MG per tablet; One by mouth twice a day for ten days.    UTI: Urinalysis highly suggestive of UTI therefore start Bactrim we will follow culture asked her to call us if no improvement by Friday.   bacterial sinusitis: Since she will be starting Bactrim we will  increase this to a full 10 days to see if there is any benefit using a second line antibiotic for bacterial sinusitis   Return if symptoms worsen or fail to improve.

## 2014-02-04 LAB — URINE CULTURE

## 2014-02-07 ENCOUNTER — Telehealth: Payer: Self-pay | Admitting: *Deleted

## 2014-02-07 MED ORDER — CIPROFLOXACIN HCL 250 MG PO TABS: ORAL_TABLET | ORAL | Status: AC

## 2014-02-07 NOTE — Telephone Encounter (Signed)
Pt.notified

## 2014-02-07 NOTE — Telephone Encounter (Signed)
Pt left a message that she is still having urinary sxs. Pt was rx'ed septra on 2/25 for UTI

## 2014-02-07 NOTE — Telephone Encounter (Signed)
Cipro has been sent to her [facility] as a substitute antibiotic.  Return for repeat urine culture on Wednesday if not improving any.

## 2014-03-01 ENCOUNTER — Encounter: Payer: Self-pay | Admitting: Emergency Medicine

## 2014-03-01 ENCOUNTER — Emergency Department (INDEPENDENT_AMBULATORY_CARE_PROVIDER_SITE_OTHER)
Admission: EM | Admit: 2014-03-01 | Discharge: 2014-03-01 | Disposition: A | Discharge status: Home / Self Care | Payer: BC Managed Care – PPO | Source: Home / Self Care | Attending: Emergency Medicine | Admitting: Emergency Medicine

## 2014-03-01 DIAGNOSIS — J069 Acute upper respiratory infection, unspecified: Secondary | ICD-10-CM

## 2014-03-01 DIAGNOSIS — J309 Allergic rhinitis, unspecified: Secondary | ICD-10-CM

## 2014-03-01 MED ORDER — FLUCONAZOLE 150 MG PO TABS: 150.0000 mg | ORAL_TABLET | Freq: Once | ORAL | Status: DC

## 2014-03-01 MED ORDER — PROMETHAZINE-CODEINE 6.25-10 MG/5ML PO SYRP: ORAL_SOLUTION | ORAL | Status: DC

## 2014-03-01 MED ORDER — AMOXICILLIN 875 MG PO TABS: ORAL_TABLET | ORAL | Status: DC

## 2014-03-01 MED ORDER — FLUTICASONE PROPIONATE 50 MCG/ACT NA SUSP: NASAL | Status: DC

## 2014-03-01 NOTE — ED Notes (Signed)
Pt c/o dry cough and low grade fever, t-max 101 x 2-3 days. No flu vac this season.

## 2014-03-01 NOTE — ED Provider Notes (Signed)
CSN: [id number]     Arrival date & time 03/01/14  1115 History   First MD Initiated Contact with Patient 03/01/14 1133     Chief Complaint  Patient presents with  . Cough   (Consider location/radiation/quality/duration/timing/severity/associated sxs/prior Treatment) HPI URI HISTORY  Sara Sanford is a 58 y.o. female who complains of onset of cold symptoms for 4 days.  Have been using over-the-counter treatment which helps a little bit.  No chills/sweats +  Fever to 101  +  Nasal congestion +  Discolored Post-nasal drainage Mild sinus pain/pressure No sore throat Also, flareup of seasonal allergies with sneezing and sinus drainage  +  Cough, nonproductive, which keeps her up at night No wheezing No chest congestion No hemoptysis No shortness of breath No pleuritic pain  No itchy/red eyes No earache, but feels bilateral ear pressure from her sinuses  Minimal nausea, but no vomiting. Tolerating by mouth's. No vomiting No abdominal pain No diarrhea  No skin rashes +  Fatigue Minimal myalgias No headache   Past Medical History  Diagnosis Date  . Heart burn     peptic ulcer  . Obesity   . Post-menopausal   . Lichen sclerosus    Past Surgical History  Procedure Laterality Date  . Dilation and curettage of uterus    . Hysteroscopy w/d&c  2008  . Cardiac catheterization  2008  . Kidney stone surgery  1977   Family History  Problem Relation Age of Onset  . Breast cancer Paternal Aunt     pm breast CA  . Breast cancer Paternal Aunt     PM breast CA  . Heart attack Father   . Hypertension Mother   . Hypertension Father    History  Substance Use Topics  . Smoking status: Never Smoker   . Smokeless tobacco: Never Used  . Alcohol Use: No   OB History   Grav Para Term Preterm Abortions TAB SAB Ect Mult Living   4 3   1  1         Review of Systems  All other systems reviewed and are negative.    Allergies  Darvon  Home Medications   Current  Outpatient Rx  Name  Route  Sig  Dispense  Refill  . amoxicillin (AMOXIL) 875 MG tablet      Take 1 twice a day X 10 days.   20 tablet   0   . aspirin 81 MG tablet   Oral   Take 81 mg by mouth daily.         . cholecalciferol (VITAMIN D) 1000 UNITS tablet   Oral   Take 1,000 Units by mouth daily.         . clobetasol cream (TEMOVATE) 0.05 %      Use vaginally every other night as directed  Disp a 60gm tube Apply 2-3 times per week   30 g   6   . fluconazole (DIFLUCAN) 150 MG tablet   Oral   Take 1 tablet (150 mg total) by mouth once. Take 1 now, then may repeat x1 in 4 days, for yeast infection.   2 tablet   0   . fluconazole (DIFLUCAN) 150 MG tablet   Oral   Take 1 tablet (150 mg total) by mouth once. Take 1 now, then may repeat x1 in 4 days, for yeast infection.   2 tablet   0   . fluticasone (FLONASE) 50 MCG/ACT nasal spray      1  or 2 sprays each nostril twice a day   16 g   0   . omeprazole (PRILOSEC) 20 MG capsule   Oral   Take 20 mg by mouth daily.         . promethazine-codeine (PHENERGAN WITH CODEINE) 6.25-10 MG/5ML syrup      Take 1-2 teaspoons every 8 hours as needed for cough. May cause drowsiness.   120 mL   0    BP 105/63  Pulse 76  Temp(Src) 99.6 F (37.6 C) (Oral)  Resp 16  Wt 248 lb (112.492 kg)  SpO2 96% Physical Exam  Nursing note and vitals reviewed. Constitutional: She is oriented to person, place, and time. She appears well-developed and well-nourished. No distress.  Occasional hacking cough noted. No acute distress  HENT:  Head: Normocephalic and atraumatic.  Right Ear: External ear and ear canal normal. A middle ear effusion (Mild, serous) is present.  Left Ear: External ear and ear canal normal. A middle ear effusion (Mild, serous) is present.  Nose: Mucosal edema and rhinorrhea present. Right sinus exhibits maxillary sinus tenderness. Left sinus exhibits maxillary sinus tenderness.  Mouth/Throat: Oropharynx is clear  and moist. No oral lesions. No oropharyngeal exudate.  Eyes: Right eye exhibits no discharge. Left eye exhibits no discharge. No scleral icterus.  Neck: Neck supple.  Cardiovascular: Normal rate, regular rhythm and normal heart sounds.   Pulmonary/Chest: Effort normal and breath sounds normal. She has no wheezes. She has no rales.  Lymphadenopathy:    She has no cervical adenopathy.  Neurological: She is alert and oriented to person, place, and time.  Skin: Skin is warm and dry.    ED Course  Procedures (including critical care time) Labs Review Labs Reviewed - No data to display Imaging Review No results found.   MDM   1. Upper respiratory infection   2. Allergic rhinitis    Treatment options discussed, as well as risks, benefits, alternatives. Patient voiced understanding and agreement with the following plans: Amoxicillin for bacterial coverage. Flonase Judicious use of OTC antihistamines, but DC if causing over drying Judicious use of OTC decongestant Mucinex and other symptomatic care Written prescription for Diflucan in case she gets a vaginal yeast infection from antibiotic Follow-up with your primary care doctor in 5-7 days if not improving, or sooner if symptoms become worse. Precautions discussed. Red flags discussed. Questions invited and answered. Patient voiced understanding and agreement.     Jacqulyn Cane, MD 03/01/14 1153

## 2014-03-23 ENCOUNTER — Encounter: Payer: Self-pay | Admitting: Obstetrics & Gynecology

## 2014-03-23 ENCOUNTER — Ambulatory Visit (INDEPENDENT_AMBULATORY_CARE_PROVIDER_SITE_OTHER): Payer: BC Managed Care – PPO | Admitting: Obstetrics & Gynecology

## 2014-03-23 VITALS — BP 99/66 | HR 53 | Resp 16 | Ht 63.0 in | Wt 251.0 lb

## 2014-03-23 DIAGNOSIS — Z0000 Encounter for general adult medical examination without abnormal findings: Secondary | ICD-10-CM

## 2014-03-23 DIAGNOSIS — Z01419 Encounter for gynecological examination (general) (routine) without abnormal findings: Secondary | ICD-10-CM

## 2014-03-23 DIAGNOSIS — Z1151 Encounter for screening for human papillomavirus (HPV): Secondary | ICD-10-CM

## 2014-03-23 DIAGNOSIS — Z124 Encounter for screening for malignant neoplasm of cervix: Secondary | ICD-10-CM

## 2014-03-23 NOTE — Progress Notes (Signed)
Subjective:    Sara Sanford is a 58 y.o. female who presents for an annual exam. The patient has no complaints today. Needs a refill of clobetasol. The patient is sexually active. GYN screening history: last pap: was normal. The patient wears seatbelts: yes. The patient participates in regular exercise: yes. Has the patient ever been transfused or tattooed?: no. The patient reports that there is not domestic violence in her life.   Menstrual History: OB History   Grav Para Term Preterm Abortions TAB SAB Ect Mult Living   4 3   1  1          Menarche age: 19  No LMP recorded. Patient is postmenopausal.    The following portions of the patient's history were reviewed and updated as appropriate: allergies, current medications, past family history, past medical history, past social history, past surgical history and problem list.  Review of Systems A comprehensive review of systems was negative. Married for 42 years. Sometimes uses a lubricant. Works at [employer] in [place]. Mammogram utd and "normal". Colonoscopy done 5 years ago and due in 5 years.   Objective:    BP 99/66  Pulse 53  Resp 16  Ht 5\' 3"  (1.6 m)  Wt 251 lb (113.853 kg)  BMI 44.47 kg/m2  General Appearance:    Alert, cooperative, no distress, appears stated age  Head:    Normocephalic, without obvious abnormality, atraumatic  Eyes:    PERRL, conjunctiva/corneas clear, EOM's intact, fundi    benign, both eyes  Ears:    Normal TM's and external ear canals, both ears  Nose:   Nares normal, septum midline, mucosa normal, no drainage    or sinus tenderness  Throat:   Lips, mucosa, and tongue normal; teeth and gums normal  Neck:   Supple, symmetrical, trachea midline, no adenopathy;    thyroid:  no enlargement/tenderness/nodules; no carotid   bruit or JVD  Back:     Symmetric, no curvature, ROM normal, no CVA tenderness  Lungs:     Clear to auscultation bilaterally, respirations unlabored  Chest Wall:    No  tenderness or deformity   Heart:    Regular rate and rhythm, S1 and S2 normal, no murmur, rub   or gallop  Breast Exam:    No tenderness, masses, or nipple abnormality  Abdomen:     Soft, non-tender, bowel sounds active all four quadrants,    no masses, no organomegaly  Genitalia:    Normal female without lesion, discharge or tenderness, NSSA, NT, normal adnexal exam  Vulva atrophic, but no abnormalities     Extremities:   Extremities normal, atraumatic, no cyanosis or edema  Pulses:   2+ and symmetric all extremities  Skin:   Skin color, texture, turgor normal, no rashes or lesions  Lymph nodes:   Cervical, supraclavicular, and axillary nodes normal  Neurologic:   CNII-XII intact, normal strength, sensation and reflexes    throughout  .    Assessment:    Healthy female exam.    Plan:     Breast self exam technique reviewed and patient encouraged to perform self-exam monthly. Thin prep Pap smear. with cotesting

## 2014-03-29 ENCOUNTER — Ambulatory Visit (INDEPENDENT_AMBULATORY_CARE_PROVIDER_SITE_OTHER): Payer: BC Managed Care – PPO | Admitting: Physician Assistant

## 2014-03-29 ENCOUNTER — Encounter: Payer: Self-pay | Admitting: Physician Assistant

## 2014-03-29 VITALS — BP 122/71 | HR 66 | Ht 63.0 in | Wt 248.0 lb

## 2014-03-29 DIAGNOSIS — K219 Gastro-esophageal reflux disease without esophagitis: Secondary | ICD-10-CM

## 2014-03-29 DIAGNOSIS — R079 Chest pain, unspecified: Secondary | ICD-10-CM

## 2014-03-29 LAB — COMPREHENSIVE METABOLIC PANEL
ALBUMIN: 4.2 g/dL (ref 3.5–5.2)
ALK PHOS: 73 U/L (ref 39–117)
ALT: 18 U/L (ref 0–35)
AST: 18 U/L (ref 0–37)
BUN: 15 mg/dL (ref 6–23)
CO2: 26 mEq/L (ref 19–32)
Calcium: 8.8 mg/dL (ref 8.4–10.5)
Chloride: 108 mEq/L (ref 96–112)
Creat: 0.65 mg/dL (ref 0.50–1.10)
GLUCOSE: 82 mg/dL (ref 70–99)
Potassium: 3.9 mEq/L (ref 3.5–5.3)
Sodium: 143 mEq/L (ref 135–145)
Total Bilirubin: 0.6 mg/dL (ref 0.2–1.2)
Total Protein: 6.6 g/dL (ref 6.0–8.3)

## 2014-03-29 LAB — LIPID PANEL
CHOLESTEROL: 196 mg/dL (ref 0–200)
HDL: 56 mg/dL (ref >39)
LDL Cholesterol: 126 mg/dL — ABNORMAL HIGH (ref 0–99)
TRIGLYCERIDES: 70 mg/dL (ref <150)
Total CHOL/HDL Ratio: 3.5 Ratio
VLDL: 14 mg/dL (ref 0–40)

## 2014-03-29 LAB — CBC
HCT: 38.5 % (ref 36.0–46.0)
HEMOGLOBIN: 13.4 g/dL (ref 12.0–15.0)
MCH: 31.6 pg (ref 26.0–34.0)
MCHC: 34.8 g/dL (ref 30.0–36.0)
MCV: 90.8 fL (ref 78.0–100.0)
Platelets: 200 10*3/uL (ref 150–400)
RBC: 4.24 MIL/uL (ref 3.87–5.11)
RDW: 14 % (ref 11.5–15.5)
WBC: 10.8 10*3/uL — ABNORMAL HIGH (ref 4.0–10.5)

## 2014-03-29 LAB — TSH: TSH: 2.439 u[IU]/mL (ref 0.350–4.500)

## 2014-03-29 MED ORDER — OMEPRAZOLE 40 MG PO CPDR
40.0000 mg | DELAYED_RELEASE_CAPSULE | Freq: Every day | ORAL | Status: DC
Start: 2014-03-29 — End: 2015-02-03

## 2014-03-29 MED ORDER — RANITIDINE HCL 300 MG PO TABS: 300.0000 mg | ORAL_TABLET | Freq: Two times a day (BID) | ORAL | Status: DC

## 2014-03-29 NOTE — Patient Instructions (Signed)
Will get Stress Test.

## 2014-03-29 NOTE — Progress Notes (Signed)
   Subjective:    Patient ID: Sara Sanford, female    DOB: 02-09-1956, 58 y.o.   MRN: [id number]  HPI Pt presents to the clinic with atypical CP. She has episodes that come every year or so. Last one was spring 2014. Starts with chest pressure and heaviness, radiates into her neck and down her left arm usually last for hours then resolves and sore the next day. Denies any SOB, sweating, nausea. Denies trigger with exertion. She has had a heart catherization in 2005 which was normal. She does take OTC prilosec 20mg (admits to missing doses) and ASA 81mg . No bowel changes or abdominal pain. She has had some worsening acid reflux and heart burn over the past couple of months.    Review of Systems     Objective:   Physical Exam  Constitutional: She is oriented to person, place, and time. She appears well-developed and well-nourished.  Obese.   HENT:  Head: Normocephalic and atraumatic.  Eyes: Conjunctivae are normal.  Cardiovascular: Normal rate, regular rhythm and normal heart sounds.   Pulmonary/Chest: Effort normal and breath sounds normal. She has no wheezes.  Abdominal: Soft. She exhibits no distension and no mass. There is no tenderness. There is no rebound and no guarding.  Neurological: She is alert and oriented to person, place, and time.  Psychiatric: She has a normal mood and affect. Her behavior is normal.          Assessment & Plan:  Chest pain/GERD- EKG NSR at 69. No ST elevation or depression. No Qwave noted. NO arrhthymias. Will get cardiac enzymes  Unclear etiology at this point; however, pt does have hx of GERD and has complained of increased heartburn recently. Does not present with any signs of GI bleed.  Will increase omeprazole 40mg  and give zantac for acute pressure/pain to see if relieves symptoms. Will get stress test to evaluate for any cardiac causes.

## 2014-03-30 ENCOUNTER — Encounter: Payer: Self-pay | Admitting: *Deleted

## 2014-03-30 LAB — TROPONIN I: Troponin I: <0.01 ng/mL (ref <0.06)

## 2014-03-30 LAB — CK TOTAL AND CKMB (NOT AT ARMC): Total CK: 65 U/L (ref 7–177)

## 2014-07-28 ENCOUNTER — Ambulatory Visit (INDEPENDENT_AMBULATORY_CARE_PROVIDER_SITE_OTHER): Payer: BC Managed Care – PPO | Admitting: Family Medicine

## 2014-07-28 ENCOUNTER — Encounter: Payer: Self-pay | Admitting: Family Medicine

## 2014-07-28 VITALS — BP 115/66 | HR 64 | Ht 63.0 in | Wt 245.0 lb

## 2014-07-28 DIAGNOSIS — R1011 Right upper quadrant pain: Secondary | ICD-10-CM

## 2014-07-28 NOTE — Progress Notes (Signed)
   Subjective:    Patient ID: Sara Sanford, female    DOB: 1956-03-08, 58 y.o.   MRN: [id number]  Flank Pain   58 year old female with a history of reflux coming in complaining of right upper quadrant pain for several months. Doesn't seem to be related to eating or not.  Worse at night. Can go several without it. Can feel nauseated when it is severe. Says it "aches", not really sharp.  Non tender. No fever or chills.  Every few days she will have some loose stools.  Every 4 weeks her stool will be really beige and light colored.  Still has her gallbladder.    Review of Systems  Genitourinary: Positive for flank pain.       Objective:   Physical Exam  Constitutional: She is oriented to person, place, and time. She appears well-developed and well-nourished.  HENT:  Head: Normocephalic and atraumatic.  Cardiovascular: Normal rate, regular rhythm and normal heart sounds.   Pulmonary/Chest: Effort normal and breath sounds normal.  Abdominal: Soft. Bowel sounds are normal. She exhibits no distension and no mass. There is tenderness. There is no rebound and no guarding.  TTP in the LUQ and RUQ   Musculoskeletal: She exhibits no edema.  Neurological: She is alert and oriented to person, place, and time.  Skin: Skin is warm and dry.  Psychiatric: She has a normal mood and affect. Her behavior is normal.          Assessment & Plan:  RUQ pain- cholecystitis vs hepatitis.  She does have some tenderness to palpation in this area. Mostly it could be liver, gallbladder or bowel related.  She has been taking her reflux medication consistently. Will get blood work today and schedule her for an abdominal ultrasound. She says she's not able to come tomorrow so we will hopefully get her scheduled for Monday.. The meantime encouraged her to eat a bland diet and make sure she is drinking plenty of water and staying hydrated well.

## 2014-07-29 LAB — CBC WITH DIFFERENTIAL/PLATELET
BASOS ABS: 0 10*3/uL (ref 0.0–0.1)
BASOS PCT: 0 % (ref 0–1)
EOS ABS: 0.1 10*3/uL (ref 0.0–0.7)
Eosinophils Relative: 1 % (ref 0–5)
HEMATOCRIT: 38.9 % (ref 36.0–46.0)
Hemoglobin: 13 g/dL (ref 12.0–15.0)
LYMPHS PCT: 68 % — AB (ref 12–46)
Lymphs Abs: 9.2 10*3/uL — ABNORMAL HIGH (ref 0.7–4.0)
MCH: 31.1 pg (ref 26.0–34.0)
MCHC: 33.4 g/dL (ref 30.0–36.0)
MCV: 93.1 fL (ref 78.0–100.0)
MONO ABS: 0.4 10*3/uL (ref 0.1–1.0)
Monocytes Relative: 3 % (ref 3–12)
Neutro Abs: 3.8 10*3/uL (ref 1.7–7.7)
Neutrophils Relative %: 28 % — ABNORMAL LOW (ref 43–77)
PLATELETS: 232 10*3/uL (ref 150–400)
RBC: 4.18 MIL/uL (ref 3.87–5.11)
RDW: 13.6 % (ref 11.5–15.5)
WBC: 13.5 10*3/uL — AB (ref 4.0–10.5)

## 2014-07-29 LAB — COMPLETE METABOLIC PANEL WITH GFR
ALBUMIN: 4.3 g/dL (ref 3.5–5.2)
ALK PHOS: 76 U/L (ref 39–117)
ALT: 15 U/L (ref 0–35)
AST: 17 U/L (ref 0–37)
BUN: 14 mg/dL (ref 6–23)
CALCIUM: 9 mg/dL (ref 8.4–10.5)
CHLORIDE: 103 meq/L (ref 96–112)
CO2: 30 mEq/L (ref 19–32)
Creat: 0.72 mg/dL (ref 0.50–1.10)
GFR, Est African American: >89 mL/min
GFR, Est Non African American: >89 mL/min
Glucose, Bld: 98 mg/dL (ref 70–99)
POTASSIUM: 4.5 meq/L (ref 3.5–5.3)
SODIUM: 140 meq/L (ref 135–145)
TOTAL PROTEIN: 6.8 g/dL (ref 6.0–8.3)
Total Bilirubin: 0.5 mg/dL (ref 0.2–1.2)

## 2014-08-01 ENCOUNTER — Encounter: Payer: Self-pay | Admitting: Family Medicine

## 2014-08-01 ENCOUNTER — Ambulatory Visit (INDEPENDENT_AMBULATORY_CARE_PROVIDER_SITE_OTHER): Payer: BC Managed Care – PPO

## 2014-08-01 DIAGNOSIS — I714 Abdominal aortic aneurysm, without rupture, unspecified: Secondary | ICD-10-CM

## 2014-08-01 DIAGNOSIS — R1011 Right upper quadrant pain: Secondary | ICD-10-CM

## 2014-08-01 DIAGNOSIS — K8020 Calculus of gallbladder without cholecystitis without obstruction: Secondary | ICD-10-CM

## 2014-08-01 HISTORY — DX: Abdominal aortic aneurysm, without rupture, unspecified: I71.40

## 2014-08-02 ENCOUNTER — Other Ambulatory Visit: Payer: Self-pay | Admitting: Family Medicine

## 2014-08-02 DIAGNOSIS — K8010 Calculus of gallbladder with chronic cholecystitis without obstruction: Secondary | ICD-10-CM

## 2014-08-03 ENCOUNTER — Telehealth: Payer: Self-pay | Admitting: *Deleted

## 2014-08-03 NOTE — Telephone Encounter (Signed)
Sara Sanford called and she would like to talk with you regarding her U/S report. 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 Margette Fast, CMA

## 2014-08-04 ENCOUNTER — Ambulatory Visit: Payer: BC Managed Care – PPO | Admitting: Family Medicine

## 2014-08-05 NOTE — Telephone Encounter (Signed)
Called patient discussed results with her as far as the dilatation of the aorta. And answered her questions. She is Re: goten a call from the surgeon's office for having her gallbladder removed.

## 2014-08-17 DIAGNOSIS — K8020 Calculus of gallbladder without cholecystitis without obstruction: Secondary | ICD-10-CM | POA: Insufficient documentation

## 2014-08-17 HISTORY — DX: Calculus of gallbladder without cholecystitis without obstruction: K80.20

## 2014-09-16 ENCOUNTER — Telehealth: Payer: Self-pay | Admitting: Family Medicine

## 2014-09-16 ENCOUNTER — Telehealth: Payer: Self-pay | Admitting: *Deleted

## 2014-09-16 NOTE — Telephone Encounter (Signed)
Patient decided that she wanted to have the CT scan that was ordered in December 2014. No prior auth is required as per [facility] web portal. Margette Fast, CMA

## 2014-09-16 NOTE — Telephone Encounter (Signed)
Re: CT maxillofacial Ltd WO CM         Dated 11/11/13/Received          09/16/14 No auth required & Bonnita Nasuti in Imaging is aware of this Order per Roselyn Reef.

## 2014-09-19 ENCOUNTER — Ambulatory Visit (INDEPENDENT_AMBULATORY_CARE_PROVIDER_SITE_OTHER): Payer: BC Managed Care – PPO

## 2014-09-19 DIAGNOSIS — J3489 Other specified disorders of nose and nasal sinuses: Secondary | ICD-10-CM

## 2014-09-19 DIAGNOSIS — R0981 Nasal congestion: Secondary | ICD-10-CM

## 2014-09-19 DIAGNOSIS — J328 Other chronic sinusitis: Secondary | ICD-10-CM

## 2014-09-21 ENCOUNTER — Other Ambulatory Visit: Payer: Self-pay | Admitting: Family Medicine

## 2014-09-21 MED ORDER — AMOXICILLIN-POT CLAVULANATE 875-125 MG PO TABS: 1.0000 | ORAL_TABLET | Freq: Two times a day (BID) | ORAL | Status: DC

## 2014-09-21 MED ORDER — FLUCONAZOLE 150 MG PO TABS: 150.0000 mg | ORAL_TABLET | Freq: Every day | ORAL | Status: DC

## 2014-09-28 ENCOUNTER — Other Ambulatory Visit: Payer: Self-pay | Admitting: Obstetrics & Gynecology

## 2014-09-28 DIAGNOSIS — Z1231 Encounter for screening mammogram for malignant neoplasm of breast: Secondary | ICD-10-CM

## 2014-10-10 ENCOUNTER — Encounter: Payer: Self-pay | Admitting: Family Medicine

## 2014-10-12 ENCOUNTER — Ambulatory Visit (INDEPENDENT_AMBULATORY_CARE_PROVIDER_SITE_OTHER): Payer: BC Managed Care – PPO

## 2014-10-12 ENCOUNTER — Ambulatory Visit: Payer: BC Managed Care – PPO

## 2014-10-12 DIAGNOSIS — Z1231 Encounter for screening mammogram for malignant neoplasm of breast: Secondary | ICD-10-CM

## 2015-02-03 ENCOUNTER — Encounter: Payer: Self-pay | Admitting: Family Medicine

## 2015-02-03 ENCOUNTER — Ambulatory Visit (INDEPENDENT_AMBULATORY_CARE_PROVIDER_SITE_OTHER): Payer: BLUE CROSS/BLUE SHIELD | Admitting: Family Medicine

## 2015-02-03 VITALS — BP 116/73 | HR 44 | Wt 248.0 lb

## 2015-02-03 DIAGNOSIS — R140 Abdominal distension (gaseous): Secondary | ICD-10-CM

## 2015-02-03 DIAGNOSIS — K2100 Gastro-esophageal reflux disease with esophagitis, without bleeding: Secondary | ICD-10-CM

## 2015-02-03 DIAGNOSIS — R1011 Right upper quadrant pain: Secondary | ICD-10-CM

## 2015-02-03 MED ORDER — RANITIDINE HCL 150 MG PO TABS: 150.0000 mg | ORAL_TABLET | Freq: Two times a day (BID) | ORAL | Status: DC

## 2015-02-03 NOTE — Progress Notes (Signed)
Subjective:    Patient ID: Sara Sanford, female    DOB: 1956-09-10, 59 y.o.   MRN: [id number]  HPI Still having RUQ pain similar to what had in August.  Pain is intermittant.  Most of the time dull ache but occ can be sharp.  Says she feels very bloated every time she eats.  No associated foods.  Stopped prilosec about 2 weeks ago and trying to eat well to avoid GERD.  She says she feels some better after BM but then after an hour latter it will ache again Occ beige colored  M. Last colonoscopy was about 4 yr ago. No blood in BM.Stools are very irregular but never goes more than 1-2 days without a BM.  She says often skip a day and in the next day she has one bowel movement in the next day has diarrhea and that it skips a day and the pattern repeats.  She still has severe reflux. She constantly feels brash coming up in the back of her throat. She's been on a PPI for almost 20 years and came off about 2 weeks ago because of the potential side effects she was concerned about. She heard about an increased risk of dementia on the news. She has been trying to make some dietary changes but feels like it really hasn't made a big difference. She has been using Tums some.  Review of Systems BP 116/73 mmHg  Pulse 44  Wt 248 lb (112.492 kg)    Allergies  Allergen Reactions  . Darvon [Propoxyphene] Other (See Comments)    Hallucinations    Past Medical History  Diagnosis Date  . Heart burn     peptic ulcer  . Obesity   . Post-menopausal   . Lichen sclerosus     Past Surgical History  Procedure Laterality Date  . Dilation and curettage of uterus    . Hysteroscopy w/d&c  2008  . Cardiac catheterization  2008  . Kidney stone surgery  1977    History   Social History  . Marital Status: Married    Spouse Name: N/A  . Number of Children: 2  . Years of Education: N/A   Occupational History  . Waitress    Social History Main Topics  . Smoking status: Never Smoker   . Smokeless  tobacco: Never Used  . Alcohol Use: No  . Drug Use: No  . Sexual Activity:    Partners: Male   Other Topics Concern  . Not on file   Social History Narrative   2 mile walk 4 times a week.      Family History  Problem Relation Age of Onset  . Breast cancer Paternal Aunt     pm breast CA  . Breast cancer Paternal Aunt     PM breast CA  . Heart attack Father   . Hypertension Mother   . Hypertension Father     Outpatient Encounter Prescriptions as of 02/03/2015  Medication Sig  . aspirin 81 MG tablet Take 81 mg by mouth daily.  . Calcium Carbonate Antacid (TUMS E-X PO) Take by mouth.  . cholecalciferol (VITAMIN D) 1000 UNITS tablet Take 1,000 Units by mouth daily.  . clobetasol cream (TEMOVATE) 0.05 % Use vaginally every other night as directed  Disp a 60gm tube Apply 2-3 times per week  . ranitidine (ZANTAC) 150 MG tablet Take 1 tablet (150 mg total) by mouth 2 (two) times daily.  . [DISCONTINUED] amoxicillin-clavulanate (AUGMENTIN) 875-125 MG per  tablet Take 1 tablet by mouth 2 (two) times daily. Chronic sinusitis  . [DISCONTINUED] fluconazole (DIFLUCAN) 150 MG tablet Take 1 tablet (150 mg total) by mouth daily.  . [DISCONTINUED] omeprazole (PRILOSEC) 40 MG capsule Take 1 capsule (40 mg total) by mouth daily.  . [DISCONTINUED] ranitidine (ZANTAC) 300 MG tablet Take 1 tablet (300 mg total) by mouth 2 (two) times daily.  . [DISCONTINUED] vitamin C (ASCORBIC ACID) 500 MG tablet Take 500 mg by mouth daily.          Objective:   Physical Exam  Constitutional: She is oriented to person, place, and time. She appears well-developed and well-nourished.  HENT:  Head: Normocephalic and atraumatic.  Cardiovascular: Normal rate, regular rhythm and normal heart sounds.   Pulmonary/Chest: Effort normal and breath sounds normal.  Abdominal: Soft. Bowel sounds are normal. She exhibits no distension and no mass. There is tenderness. There is no rebound and no guarding.  + TTP in the RUQ  and around the umbilicus  Neurological: She is alert and oriented to person, place, and time.  Skin: Skin is warm and dry.  Psychiatric: She has a normal mood and affect. Her behavior is normal.          Assessment & Plan:  Right upper quadrant pain-still unclear etiology. She could have a very tight narrowing or stricture or junction of the colon in the right upper quadrant is causing some pain and discomfort when the stool passes through that area. Explained it can cause some distention and triggered a stat stretch receptors and causes a dull ache. No improvement after cholecystectomy. We'll recheck liver enzymes. Next  Bloating-could be due to a food intolerance or will check for milk allergy as well as gluten intolerance. Also discussed referring her to GI for further evaluation. She wants to do the blood work first. We will also recheck her liver enzymes.  GERD-she has been on a PPI for most 20 years. She's very concerned about the side effects section stopped her medication a couple of weeks ago. We discussed this potential side effects and still a question bowling dementia which has not yet to be proven. Because she is still having persistent symptoms I recommend at least a trial of an H2 blocker such as Zantac in addition to some dietary changes. We reviewed these in addition handout was given.

## 2015-02-03 NOTE — Patient Instructions (Addendum)
Zantac 150mg  twice a day.  Take about 20 min before breakfast.    Food Choices for Gastroesophageal Reflux Disease When you have gastroesophageal reflux disease (GERD), the foods you eat and your eating habits are very important. Choosing the right foods can help ease the discomfort of GERD. WHAT GENERAL GUIDELINES DO I NEED TO FOLLOW?  Choose fruits, vegetables, whole grains, low-fat dairy products, and low-fat meat, fish, and poultry.  Limit fats such as oils, salad dressings, butter, nuts, and avocado.  Keep a food diary to identify foods that cause symptoms.  Avoid foods that cause reflux. These may be different for different people.  Eat frequent small meals instead of three large meals each day.  Eat your meals slowly, in a relaxed setting.  Limit fried foods.  Cook foods using methods other than frying.  Avoid drinking alcohol.  Avoid drinking large amounts of liquids with your meals.  Avoid bending over or lying down until 2-3 hours after eating. WHAT FOODS ARE NOT RECOMMENDED? The following are some foods and drinks that may worsen your symptoms: Vegetables Tomatoes. Tomato juice. Tomato and spaghetti sauce. Chili peppers. Onion and garlic. Horseradish. Fruits Oranges, grapefruit, and lemon (fruit and juice). Meats High-fat meats, fish, and poultry. This includes hot dogs, ribs, ham, sausage, salami, and bacon. Dairy Whole milk and chocolate milk. Sour cream. Cream. Butter. Ice cream. Cream cheese.  Beverages Coffee and tea, with or without caffeine. Carbonated beverages or energy drinks. Condiments Hot sauce. Barbecue sauce.  Sweets/Desserts Chocolate and cocoa. Donuts. Peppermint and spearmint. Fats and Oils High-fat foods, including [place] fries and potato chips. Other Vinegar. Strong spices, such as black pepper, white pepper, red pepper, cayenne, curry powder, cloves, ginger, and chili powder. The items listed above may not be a complete list of foods and  beverages to avoid. Contact your dietitian for more information. Document Released: 11/25/2005 Document Revised: 11/30/2013 Document Reviewed: 09/29/2013 [facility] Patient Information 2015 [place], [place]. This information is not intended to replace advice given to you by your health care provider. Make sure you discuss any questions you have with your health care provider.

## 2015-02-04 LAB — COMPLETE METABOLIC PANEL WITH GFR
ALBUMIN: 4.1 g/dL (ref 3.5–5.2)
ALT: 18 U/L (ref 0–35)
AST: 20 U/L (ref 0–37)
Alkaline Phosphatase: 77 U/L (ref 39–117)
BUN: 15 mg/dL (ref 6–23)
CHLORIDE: 105 meq/L (ref 96–112)
CO2: 29 meq/L (ref 19–32)
Calcium: 9.2 mg/dL (ref 8.4–10.5)
Creat: 0.74 mg/dL (ref 0.50–1.10)
GFR, Est Non African American: 88 mL/min
GLUCOSE: 91 mg/dL (ref 70–99)
POTASSIUM: 4.7 meq/L (ref 3.5–5.3)
SODIUM: 141 meq/L (ref 135–145)
Total Bilirubin: 0.5 mg/dL (ref 0.2–1.2)
Total Protein: 6.6 g/dL (ref 6.0–8.3)

## 2015-02-06 LAB — GLIA (IGA/G) + TTG IGA
Gliadin IgA: 6 Units (ref <20)
Gliadin IgG: 1 Units (ref <20)
TISSUE TRANSGLUTAMINASE AB, IGA: 3 U/mL (ref <4)

## 2015-02-06 LAB — ALLERGEN MILK

## 2015-02-07 ENCOUNTER — Telehealth: Payer: Self-pay | Admitting: Family Medicine

## 2015-02-07 ENCOUNTER — Other Ambulatory Visit: Payer: Self-pay | Admitting: Family Medicine

## 2015-02-07 DIAGNOSIS — R1011 Right upper quadrant pain: Secondary | ICD-10-CM

## 2015-02-15 NOTE — Telephone Encounter (Signed)
error 

## 2015-04-07 ENCOUNTER — Telehealth: Payer: Self-pay | Admitting: *Deleted

## 2015-04-07 ENCOUNTER — Ambulatory Visit (INDEPENDENT_AMBULATORY_CARE_PROVIDER_SITE_OTHER): Payer: BLUE CROSS/BLUE SHIELD | Admitting: Physician Assistant

## 2015-04-07 ENCOUNTER — Encounter: Payer: Self-pay | Admitting: Physician Assistant

## 2015-04-07 VITALS — BP 131/76 | HR 73 | Ht 63.0 in | Wt 244.0 lb

## 2015-04-07 DIAGNOSIS — F410 Panic disorder [episodic paroxysmal anxiety] without agoraphobia: Secondary | ICD-10-CM | POA: Diagnosis not present

## 2015-04-07 DIAGNOSIS — F411 Generalized anxiety disorder: Secondary | ICD-10-CM

## 2015-04-07 MED ORDER — DIAZEPAM 5 MG PO TABS: 5.0000 mg | ORAL_TABLET | Freq: Two times a day (BID) | ORAL | Status: DC | PRN

## 2015-04-07 NOTE — Progress Notes (Signed)
   Subjective:    Patient ID: Sara Sanford, female    DOB: Oct 07, 1956, 59 y.o.   MRN: [id number]  HPI  Pt presents to the clinic with new onset of panic attacks. She has her first episode after a female surgery years back and then after a few months with no medication they resolved. These new episodes started in hobby lobby. She started to feel hot, and like she was choking. She was sweating and just felt pending doom. She finally talked herself down and went home. Now they have been more frequent. She cannot drive on [place], she had one the garden at her house. She is trying the behavioral techniques learned the first time but just needs more. She had a tablet for some unknown pill to calm herself down before a colonoscopy. She took it and helped a lot. She would like some of these to help her. She has no other triggers for anxiety. She has had a very hard year or so but not one specific event she thinks has caused.  No suicidal or homicidal thoughts.    Review of Systems  All other systems reviewed and are negative.      Objective:   Physical Exam  Constitutional: She is oriented to person, place, and time. She appears well-developed and well-nourished.  HENT:  Head: Normocephalic and atraumatic.  Cardiovascular: Normal rate, regular rhythm and normal heart sounds.   Pulmonary/Chest: Effort normal and breath sounds normal. She has no wheezes.  Neurological: She is alert and oriented to person, place, and time.  Psychiatric: She has a normal mood and affect. Her behavior is normal.          Assessment & Plan:  GAD/painic attacks-  PHQ-9 was 14. GAD-7 13.  Pt brought in tablets. Confirmed was valium. Discussed what medication is and abuse potential as well as sedation warning. Did give her a small quantity to take in response to anxiety. Discussed how counseling could help and maybe bring light to what caused new episodes. Also discussed if using valium on regular basis then may  need to consider daily SSRI. Pt declined start today. Follow up in 1-2 months with PCP or sooner if symptoms worsening.

## 2015-04-07 NOTE — Telephone Encounter (Signed)
Called [facility] to see if pt had her colonoscopy there back in 2012.  Their comp systems are down & won't be back up all day.  I asked her to fax [place] a copy if she had it done there & if not, to call me and let me know.

## 2015-04-10 ENCOUNTER — Emergency Department (INDEPENDENT_AMBULATORY_CARE_PROVIDER_SITE_OTHER)
Admission: EM | Admit: 2015-04-10 | Discharge: 2015-04-10 | Disposition: A | Discharge status: Home / Self Care | Payer: BLUE CROSS/BLUE SHIELD | Source: Home / Self Care | Attending: Family Medicine | Admitting: Family Medicine

## 2015-04-10 ENCOUNTER — Emergency Department (INDEPENDENT_AMBULATORY_CARE_PROVIDER_SITE_OTHER): Payer: BLUE CROSS/BLUE SHIELD

## 2015-04-10 ENCOUNTER — Encounter: Payer: Self-pay | Admitting: Emergency Medicine

## 2015-04-10 DIAGNOSIS — M542 Cervicalgia: Secondary | ICD-10-CM

## 2015-04-10 DIAGNOSIS — R079 Chest pain, unspecified: Secondary | ICD-10-CM

## 2015-04-10 DIAGNOSIS — K219 Gastro-esophageal reflux disease without esophagitis: Secondary | ICD-10-CM

## 2015-04-10 DIAGNOSIS — R0789 Other chest pain: Secondary | ICD-10-CM | POA: Diagnosis not present

## 2015-04-10 MED ORDER — OMEPRAZOLE 20 MG PO CPDR: DELAYED_RELEASE_CAPSULE | ORAL | Status: DC

## 2015-04-10 NOTE — ED Notes (Signed)
Chest pain started this mornnig about 1am 8/10, pain radiates down right arm, pain now at 4/10

## 2015-04-10 NOTE — ED Provider Notes (Signed)
CSN: [id number]     Arrival date & time 04/10/15  1254 History   First MD Initiated Contact with Patient 04/10/15 1319     Chief Complaint  Patient presents with  . Chest Pain      HPI Comments: Patient complains of onset of burning/aching anterior chest pain beneath her left breast at 1am last night.  The pain radiates to her left neck and arm and into her left second and third fingers.  The pain was somewhat colicky and had decreased by 4am.  The pain is better when she sits and stands very straight.  She has no pain with activity.  She has a history of GERD, and until two months ago she had taken Prilosec 20mg  one daily.  Recently she has been taking 5 to 6 Tums per day without much improvement.  She had a negative cardiac cath in 2005.  She had similar symptoms 03/29/14 with negative EKG and cardiac enzymes.  Patient is a 59 y.o. female presenting with chest pain. The history is provided by the patient.  Chest Pain Pain location:  L chest Pain quality: aching and burning   Pain radiates to:  Neck, L shoulder and L arm Pain radiates to the back: no   Pain severity:  Moderate Onset quality:  Sudden Duration:  12 hours Timing:  Constant Progression:  Improving Chronicity:  Recurrent Context: at rest   Context: not breathing, not eating, no movement and not raising an arm   Relieved by:  Nothing Worsened by:  Certain positions Ineffective treatments:  Antacids Associated symptoms: AICD problem, heartburn and nausea   Associated symptoms: no abdominal pain, no anorexia, no back pain, no claudication, no cough, no diaphoresis, no dizziness, no dysphagia, no fatigue, no fever, no near-syncope, no numbness, no orthopnea, no palpitations, no PND, no shortness of breath, no syncope, not vomiting and no weakness   Risk factors: obesity   Risk factors: no diabetes mellitus, no hypertension and no smoking     Past Medical History  Diagnosis Date  . Heart burn     peptic ulcer  . Obesity    . Post-menopausal   . Lichen sclerosus    Past Surgical History  Procedure Laterality Date  . Dilation and curettage of uterus    . Hysteroscopy w/d&c  2008  . Cardiac catheterization  2008  . Kidney stone surgery  1977   Family History  Problem Relation Age of Onset  . Breast cancer Paternal Aunt     pm breast CA  . Breast cancer Paternal Aunt     PM breast CA  . Heart attack Father   . Hypertension Mother   . Hypertension Father    History  Substance Use Topics  . Smoking status: Never Smoker   . Smokeless tobacco: Never Used  . Alcohol Use: No   OB History    Gravida Para Term Preterm AB TAB SAB Ectopic Multiple Living   4 3   1  1         Review of Systems  Constitutional: Negative for fever, diaphoresis and fatigue.  HENT: Negative for trouble swallowing.   Respiratory: Negative for cough and shortness of breath.   Cardiovascular: Positive for chest pain. Negative for palpitations, orthopnea, claudication, syncope, PND and near-syncope.  Gastrointestinal: Positive for heartburn and nausea. Negative for vomiting, abdominal pain and anorexia.  Musculoskeletal: Negative for back pain.  Neurological: Negative for dizziness, weakness and numbness.    Allergies  Darvon  Home  Medications   Prior to Admission medications   Medication Sig Start Date End Date Taking? Authorizing Provider  aspirin 81 MG tablet Take 81 mg by mouth daily.    Historical Provider, MD  Calcium Carbonate Antacid (TUMS E-X PO) Take by mouth.    Historical Provider, MD  cholecalciferol (VITAMIN D) 1000 UNITS tablet Take 1,000 Units by mouth daily.    Historical Provider, MD  clobetasol cream (TEMOVATE) 0.05 % Use vaginally every other night as directed  Disp a 60gm tube Apply 2-3 times per week 07/15/12   Emily Filbert, MD  diazepam (VALIUM) 5 MG tablet Take 1 tablet (5 mg total) by mouth every 12 (twelve) hours as needed for anxiety. 04/07/15   Jade L Breeback, PA-C  omeprazole (PRILOSEC) 20 MG  capsule Take one cap each morning 30 minutes before food.  Take one cap (before eating) each evening for one week. 04/10/15   Kandra Nicolas, MD  ranitidine (ZANTAC) 150 MG tablet Take 1 tablet (150 mg total) by mouth 2 (two) times daily. 02/03/15   Hali Marry, MD   BP 116/74 mmHg  Pulse 65  Temp(Src) 97.8 F (36.6 C) (Oral)  Ht 5\' 3"  (1.6 m)  Wt 243 lb (110.224 kg)  BMI 43.06 kg/m2  SpO2 99% Physical Exam Nursing notes and Vital Signs reviewed. Appearance:  Patient appears stated age, and in no acute distress.  Patient is obese (BMI 43.1) Eyes:  Pupils are equal, round, and reactive to light and accomodation.  Extraocular movement is intact.  Conjunctivae are not inflamed  Nose:  Normal  Pharynx:  Normal Neck:  Supple.  No adenopathy Lungs:  Clear to auscultation.  Breath sounds are equal.  Heart:  Regular rate and rhythm without murmurs, rubs, or gallops. Chest:  Nontender  Abdomen:  Nontender without masses or hepatosplenomegaly.  Bowel sounds are present.  No CVA or flank tenderness.  Extremities:  No edema.  No calf tenderness Skin:  No rash present.   ED Course  Procedures  None  Labs Reviewed -    EKG: Rate:  78 BPM PR:  162 msec QT:  376 msec QTcH:  407 msec QRSD:  100 msec QRS axis:  18 degrees Interpretation:   Normal sinus rhythm; no acute changes.  Essentially unchanged from EKG done 03/29/14  Imaging Review Dg Cervical Spine Complete  04/10/2015   CLINICAL DATA:  59 year old female with left side cervical neck pain radiating to the upper extremities. Left side chest pain. No known injury. Initial encounter.  EXAM: CERVICAL SPINE  4+ VIEWS  COMPARISON:  None.  FINDINGS: Straightening of cervical lordosis. Normal prevertebral soft tissue contour. Cervicothoracic junction alignment is within normal limits. Relatively preserved disc spaces. Bilateral posterior element alignment is within normal limits. AP alignment and lung apices within normal limits. Normal  C1-C2 alignment and odontoid.  IMPRESSION: No acute osseous abnormality identified in the cervical spine.   Electronically Signed   By: Genevie Ann M.D.   On: 04/10/2015 14:13     MDM   1. Non-cardiac chest pain   2. Gastroesophageal reflux disease, esophagitis presence not specified    Resume omeprazole 20mg , one BID for one week then decrease to one daily. Followup with Family Doctor in 3 weeks. If symptoms become significantly worse during the night or over the weekend, proceed to the local emergency room.     Kandra Nicolas, MD 04/12/15 [phone]

## 2015-04-10 NOTE — Discharge Instructions (Signed)
If symptoms become significantly worse during the night or over the weekend, proceed to the local emergency room.  

## 2015-05-23 ENCOUNTER — Ambulatory Visit (INDEPENDENT_AMBULATORY_CARE_PROVIDER_SITE_OTHER): Payer: BLUE CROSS/BLUE SHIELD | Admitting: Family Medicine

## 2015-05-23 ENCOUNTER — Encounter: Payer: Self-pay | Admitting: Family Medicine

## 2015-05-23 VITALS — BP 104/56 | HR 64 | Ht 63.0 in | Wt 239.0 lb

## 2015-05-23 DIAGNOSIS — Z13220 Encounter for screening for lipoid disorders: Secondary | ICD-10-CM | POA: Diagnosis not present

## 2015-05-23 DIAGNOSIS — Z1159 Encounter for screening for other viral diseases: Secondary | ICD-10-CM

## 2015-05-23 DIAGNOSIS — F411 Generalized anxiety disorder: Secondary | ICD-10-CM

## 2015-05-23 DIAGNOSIS — Z114 Encounter for screening for human immunodeficiency virus [HIV]: Secondary | ICD-10-CM

## 2015-05-23 DIAGNOSIS — F410 Panic disorder [episodic paroxysmal anxiety] without agoraphobia: Secondary | ICD-10-CM

## 2015-05-23 DIAGNOSIS — K219 Gastro-esophageal reflux disease without esophagitis: Secondary | ICD-10-CM

## 2015-05-23 MED ORDER — FLUOXETINE HCL 10 MG PO CAPS: ORAL_CAPSULE | ORAL | Status: DC

## 2015-05-23 NOTE — Progress Notes (Signed)
   Subjective:    Patient ID: Sara Sanford, female    DOB: 03/20/1956, 59 y.o.   MRN: [id number]  HPI F/U Mood/anxiety/history of panic attacks - phq-9=16 gad-7=16; pt reports that she sometimes skips days with her valium she states that some days it works well she has not taken any in the past 2 days. Says she tries to exercise when she starts to feel her anxiety build  Says part of it is her grief from her father passed away in 2023-01-18.  She is now helping to take care of her mother.  She has a lot of stressors right now.  Discussed little interest or pleasure doing things more than half the days and feeling down depressed nearly every day. Also complains of feeling nervous and on edge nearly every day difficulty controlling her worrying nearly every day. She has had thoughts of being better off dead several days but no active thoughts of harming herself.  Patient went to urgent care about 6 weeks ago for chest pain. It was felt to be noncardiac in fact be related to her reflux. They discontinued her H2 blocker and put her back on a PPI. Has seemed to help.  She's not had any more symptoms since switching back to the PPI but wants to what to know what to do long-term.   Review of Systems     Objective:   Physical Exam  Constitutional: She is oriented to person, place, and time. She appears well-developed and well-nourished.  HENT:  Head: Normocephalic and atraumatic.  Cardiovascular: Normal rate, regular rhythm and normal heart sounds.   Pulmonary/Chest: Effort normal and breath sounds normal.  Neurological: She is alert and oriented to person, place, and time.  Skin: Skin is warm and dry.  Psychiatric: She has a normal mood and affect. Her behavior is normal.          Assessment & Plan:  Anxiety/panic attacks-gad 7 score of 16 today and PHQ 9 score of 16 today.. Symptoms are not well controlled on her current regimen.  She is open to therapy. Uncontrolled. Discussed really needs  an SSRI.  Discussed potential side effects.  F/u in 3 weeks. Discussed dec dose on bezo as start SSRI. Follow-up in 3 weeks. Discussed the importance of weaning down the benzodiazepine. He is epidural for all for therapy so will schedule with Teresa Pelton.  Atypical chest pain/GERD - no more sxs on the PPI.  Continue for 3 more weeks, for a total of 6 weeks. Then try going back down to H2 blocker.  Make sure really watching her diet and avoiding triggers for reflux.  Time spent 25 min, > 50% spent counseling about anxiety/panic and GERD. Chest pain.

## 2015-05-25 ENCOUNTER — Ambulatory Visit: Payer: BLUE CROSS/BLUE SHIELD | Admitting: Family Medicine

## 2015-05-26 ENCOUNTER — Telehealth: Payer: Self-pay | Admitting: *Deleted

## 2015-05-26 NOTE — Telephone Encounter (Signed)
Pt called and wanted to know about the new medication (prozac) she is concerned about the possible side effects and she doesn't want to take this until she has someone at home with her. Also she wants to know what she should do about her anxiety attacks in the mean time

## 2015-06-16 ENCOUNTER — Ambulatory Visit: Payer: BLUE CROSS/BLUE SHIELD | Admitting: Family Medicine

## 2015-07-03 ENCOUNTER — Encounter: Payer: Self-pay | Admitting: Family Medicine

## 2015-07-03 ENCOUNTER — Ambulatory Visit (INDEPENDENT_AMBULATORY_CARE_PROVIDER_SITE_OTHER): Payer: BLUE CROSS/BLUE SHIELD | Admitting: Family Medicine

## 2015-07-03 VITALS — BP 119/60 | HR 64 | Ht 63.0 in | Wt 230.0 lb

## 2015-07-03 DIAGNOSIS — R0789 Other chest pain: Secondary | ICD-10-CM | POA: Diagnosis not present

## 2015-07-03 DIAGNOSIS — F411 Generalized anxiety disorder: Secondary | ICD-10-CM | POA: Diagnosis not present

## 2015-07-03 MED ORDER — FLUOXETINE HCL 20 MG PO CAPS: 20.0000 mg | ORAL_CAPSULE | Freq: Every day | ORAL | Status: DC

## 2015-07-03 NOTE — Progress Notes (Signed)
   Subjective:    Patient ID: Sara Sanford, female    DOB: May 17, 1956, 59 y.o.   MRN: [id number]  HPI  Here for follow-up mood and anxiety. She had been actually under a lot of stress recently. 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 She was noticing her anxiety would build even if she was trying to exercise. Her PHQ 9 score was 16 and Gad 7 score was 16 when I saw her about 6 weeks ago. After much discussion we decided to put her on fluoxetine.she has been on it for about 3 weeks.  BMs have been abit loose on it.  She has decreased appetite. She has been exercising. Se is walking 7 miles a day. She has lost 9 lbs. Only used one valium in the last 2 weeks. She is seeing Suann Larry for counseling. They are working on Academic librarian.   Atypical chest pain - Says it has been much better. Has cut her prilosec down to every 2 days. Says has been really trying to watch her diet.   Review of Systems     Objective:   Physical Exam  Constitutional: She is oriented to person, place, and time. She appears well-developed and well-nourished.  HENT:  Head: Normocephalic and atraumatic.  Cardiovascular: Normal rate, regular rhythm and normal heart sounds.   Pulmonary/Chest: Effort normal and breath sounds normal.  Neurological: She is alert and oriented to person, place, and time.  Skin: Skin is warm and dry.  Psychiatric: She has a normal mood and affect. Her behavior is normal.          Assessment & Plan:  Generalized anxiety disorder-gad 7 score is down to 2 which is fantastic and PHQ 9 score is down to 5 which is fantastic. And 2 of the points for 4 overeating. She rates her symptoms as not difficult at all.  Atypical chest pain - resolved.  She is doing much better and is really trying to control her diet to control her reflux and chest discomfort.  BMI 40 - has lost 7 lbs. Keep up the walking.

## 2015-07-23 ENCOUNTER — Encounter: Payer: Self-pay | Admitting: *Deleted

## 2015-07-23 ENCOUNTER — Emergency Department (INDEPENDENT_AMBULATORY_CARE_PROVIDER_SITE_OTHER)
Admission: EM | Admit: 2015-07-23 | Discharge: 2015-07-23 | Disposition: A | Discharge status: Home / Self Care | Payer: BLUE CROSS/BLUE SHIELD | Source: Home / Self Care | Attending: Family Medicine | Admitting: Family Medicine

## 2015-07-23 DIAGNOSIS — R300 Dysuria: Secondary | ICD-10-CM

## 2015-07-23 DIAGNOSIS — N3000 Acute cystitis without hematuria: Secondary | ICD-10-CM | POA: Diagnosis not present

## 2015-07-23 LAB — POCT URINALYSIS DIP (MANUAL ENTRY)
Bilirubin, UA: NEGATIVE
Blood, UA: NEGATIVE
Glucose, UA: NEGATIVE
Ketones, POC UA: NEGATIVE
Nitrite, UA: POSITIVE — AB
SPEC GRAV UA: 1.02 (ref 1.005–1.03)
Urobilinogen, UA: 1 (ref 0–1)
pH, UA: 8.5 (ref 5–8)

## 2015-07-23 MED ORDER — PHENAZOPYRIDINE HCL 200 MG PO TABS: 200.0000 mg | ORAL_TABLET | Freq: Three times a day (TID) | ORAL | Status: DC

## 2015-07-23 MED ORDER — CEPHALEXIN 500 MG PO CAPS: 500.0000 mg | ORAL_CAPSULE | Freq: Two times a day (BID) | ORAL | Status: DC

## 2015-07-23 NOTE — Discharge Instructions (Signed)
°  Please take antibiotics as prescribed and be sure to complete entire course even if you start to feel better to ensure infection does not come back. ° °

## 2015-07-23 NOTE — ED Notes (Signed)
Pt c/o dysuria, frequent urination and low back ache x 1 day. Denies fever. She took Urostat last night.

## 2015-07-23 NOTE — ED Provider Notes (Signed)
CSN: [id number]     Arrival date & time 07/23/15  1140 History   First MD Initiated Contact with Patient 07/23/15 1211     Chief Complaint  Patient presents with  . Dysuria   (Consider location/radiation/quality/duration/timing/severity/associated sxs/prior Treatment) HPI  Pt is a 59yo female with hx of UTIs, presenting to [facility] with c/o symptoms c/w prior UTIs.  Pt is c/o bilateral lower back pain, lower abdominal pain, dysuria, urinary frequency and urgency.  Pain is aching and sore, mild to moderate in severity.  Symptoms started last night.  She did take Uristat last night which provided mild relief.  Denies fever, chills, n/v/d.   Last UTI about 6 weeks ago.  Pt has been swimming more this week. She has used clotrimazole cream and wonders if that caused her current UTI. Denies recent intercourse. Denies vaginal symptoms.   Past Medical History  Diagnosis Date  . Heart burn     peptic ulcer  . Obesity   . Post-menopausal   . Lichen sclerosus    Past Surgical History  Procedure Laterality Date  . Dilation and curettage of uterus    . Hysteroscopy w/d&c  2008  . Cardiac catheterization  2008  . Kidney stone surgery  1977   Family History  Problem Relation Age of Onset  . Breast cancer Paternal Aunt     pm breast CA  . Breast cancer Paternal Aunt     PM breast CA  . Heart attack Father   . Hypertension Mother   . Hypertension Father    Social History  Substance Use Topics  . Smoking status: Never Smoker   . Smokeless tobacco: Never Used  . Alcohol Use: No   OB History    Gravida Para Term Preterm AB TAB SAB Ectopic Multiple Living   4 3   1  1         Review of Systems  Constitutional: Negative for fever and chills.  Gastrointestinal: Positive for abdominal pain ( lower). Negative for nausea, vomiting and diarrhea.  Genitourinary: Positive for dysuria, urgency and frequency. Negative for hematuria, decreased urine volume, vaginal bleeding, vaginal discharge, vaginal  pain and pelvic pain.  Musculoskeletal: Positive for back pain. Negative for myalgias.    Allergies  Darvon  Home Medications   Prior to Admission medications   Medication Sig Start Date End Date Taking? Authorizing Provider  aspirin 81 MG tablet Take 81 mg by mouth daily.    Historical Provider, MD  cephALEXin (KEFLEX) 500 MG capsule Take 1 capsule (500 mg total) by mouth 2 (two) times daily. 07/23/15   Noland Fordyce, PA-C  cholecalciferol (VITAMIN D) 1000 UNITS tablet Take 1,000 Units by mouth daily.    Historical Provider, MD  clobetasol cream (TEMOVATE) 0.05 % Use vaginally every other night as directed  Disp a 60gm tube Apply 2-3 times per week 07/15/12   Emily Filbert, MD  diazepam (VALIUM) 5 MG tablet Take 1 tablet (5 mg total) by mouth every 12 (twelve) hours as needed for anxiety. 04/07/15   Jade L Breeback, PA-C  FLUoxetine (PROZAC) 20 MG capsule Take 1 capsule (20 mg total) by mouth daily. One a day x 5 days then 2 a day PO 07/03/15   Hali Marry, MD  omeprazole (PRILOSEC) 20 MG capsule Take one cap each morning 30 minutes before food.  Take one cap (before eating) each evening for one week. 04/10/15   Kandra Nicolas, MD  phenazopyridine (PYRIDIUM) 200 MG tablet Take  1 tablet (200 mg total) by mouth 3 (three) times daily. 07/23/15   Noland Fordyce, PA-C   BP 95/65 mmHg  Pulse 55  Temp(Src) 98.1 F (36.7 C) (Oral)  Resp 16  Ht 5\' 3"  (1.6 m)  Wt 225 lb (102.059 kg)  BMI 39.87 kg/m2  SpO2 96% Physical Exam  Constitutional: She appears well-developed and well-nourished. No distress.  HENT:  Head: Normocephalic and atraumatic.  Eyes: Conjunctivae are normal. No scleral icterus.  Neck: Normal range of motion.  Cardiovascular: Normal rate, regular rhythm and normal heart sounds.   Pulmonary/Chest: Effort normal and breath sounds normal. No respiratory distress. She has no wheezes. She has no rales. She exhibits no tenderness.  Abdominal: Soft. She exhibits no distension and  no mass. There is tenderness. There is no rebound and no guarding.  Soft, non-distended. Mild tenderness to lower abdomen w/o rebound, guarding or masses. No CVAT  Musculoskeletal: Normal range of motion.  Neurological: She is alert.  Skin: Skin is warm and dry. She is not diaphoretic.  Nursing note and vitals reviewed.   ED Course  Procedures (including critical care time) Labs Review Labs Reviewed  POCT URINALYSIS DIP (MANUAL ENTRY) - Abnormal; Notable for the following:    Protein Ur, POC trace (*)    Nitrite, UA Positive (*)    Leukocytes, UA small (1+) (*)    All other components within normal limits  URINE CULTURE    Imaging Review No results found.   MDM   1. Dysuria   2. Acute cystitis without hematuria    Pt with hx of UTI, last one 6 mo ago, presenting to UC with same symptoms. Pt appears well, non-toxic. NAD. No CVAT. Abdomen: soft, non-tender UA: c/w UTI Rx: keflex and pyridium F/u with PCP in 4-5 days if not improving, sooner if worsening. Patient verbalized understanding and agreement with treatment plan.     Noland Fordyce, PA-C 07/23/15 1407

## 2015-07-26 ENCOUNTER — Telehealth: Payer: Self-pay

## 2015-07-26 LAB — URINE CULTURE: Colony Count: >=100000

## 2015-08-09 ENCOUNTER — Ambulatory Visit (INDEPENDENT_AMBULATORY_CARE_PROVIDER_SITE_OTHER): Payer: BLUE CROSS/BLUE SHIELD | Admitting: Obstetrics & Gynecology

## 2015-08-09 ENCOUNTER — Encounter: Payer: Self-pay | Admitting: Obstetrics & Gynecology

## 2015-08-09 VITALS — BP 97/55 | HR 80 | Ht 64.0 in | Wt 223.0 lb

## 2015-08-09 DIAGNOSIS — N898 Other specified noninflammatory disorders of vagina: Secondary | ICD-10-CM

## 2015-08-09 DIAGNOSIS — Z124 Encounter for screening for malignant neoplasm of cervix: Secondary | ICD-10-CM | POA: Diagnosis not present

## 2015-08-09 DIAGNOSIS — L298 Other pruritus: Secondary | ICD-10-CM

## 2015-08-09 DIAGNOSIS — Z01419 Encounter for gynecological examination (general) (routine) without abnormal findings: Secondary | ICD-10-CM | POA: Diagnosis not present

## 2015-08-09 DIAGNOSIS — Z1151 Encounter for screening for human papillomavirus (HPV): Secondary | ICD-10-CM | POA: Diagnosis not present

## 2015-08-09 DIAGNOSIS — Z0000 Encounter for general adult medical examination without abnormal findings: Secondary | ICD-10-CM

## 2015-08-09 MED ORDER — FLUOXETINE HCL 20 MG PO CAPS: 20.0000 mg | ORAL_CAPSULE | Freq: Every day | ORAL | Status: DC

## 2015-08-09 MED ORDER — CLOBETASOL PROPIONATE 0.05 % EX CREA
TOPICAL_CREAM | CUTANEOUS | Status: DC
Start: 2015-08-09 — End: 2017-01-06

## 2015-08-09 NOTE — Progress Notes (Signed)
Subjective:    Sara Sanford is a 59 y.o. MW  female who presents for an annual exam. The patient has no complaints today. She needs a refill of her clobetasol and prozac. She took ABX recently and has had some vaginal itching;.  The patient is sexually active. GYN screening history: last pap: was normal. The patient wears seatbelts: yes. The patient participates in regular exercise: yes. Has the patient ever been transfused or tattooed?: no. The patient reports that there is not domestic violence in her life.   Menstrual History: OB History    Gravida Para Term Preterm AB TAB SAB Ectopic Multiple Living   4 3   1  1          Menarche age: 36  No LMP recorded. Patient is postmenopausal.    The following portions of the patient's history were reviewed and updated as appropriate: allergies, current medications, past family history, past medical history, past social history, past surgical history and problem list.  Review of Systems A comprehensive review of systems was negative.  married for decades, mammogram UTD and normal. Plans to get repeat colonoscopy this year.   Objective:    BP 97/55 mmHg  Pulse 80  Ht 5\' 4"  (1.626 m)  Wt 223 lb (101.152 kg)  BMI 38.26 kg/m2  General Appearance:    Alert, cooperative, no distress, appears stated age  Head:    Normocephalic, without obvious abnormality, atraumatic  Eyes:    PERRL, conjunctiva/corneas clear, EOM's intact, fundi    benign, both eyes  Ears:    Normal TM's and external ear canals, both ears  Nose:   Nares normal, septum midline, mucosa normal, no drainage    or sinus tenderness  Throat:   Lips, mucosa, and tongue normal; teeth and gums normal  Neck:   Supple, symmetrical, trachea midline, no adenopathy;    thyroid:  no enlargement/tenderness/nodules; no carotid   bruit or JVD  Back:     Symmetric, no curvature, ROM normal, no CVA tenderness  Lungs:     Clear to auscultation bilaterally, respirations unlabored  Chest Wall:     No tenderness or deformity   Heart:    Regular rate and rhythm, S1 and S2 normal, no murmur, rub   or gallop  Breast Exam:    No tenderness, masses, or nipple abnormality  Abdomen:     Soft, non-tender, bowel sounds active all four quadrants,    no masses, no organomegaly  Genitalia:    Normal female without lesion, discharge or tenderness, somewhat red vulva, absolutely no discharge in vault, NSSA, NT, mobile, normal adnexal exam     Extremities:   Extremities normal, atraumatic, no cyanosis or edema  Pulses:   2+ and symmetric all extremities  Skin:   Skin color, texture, turgor normal, no rashes or lesions  Lymph nodes:   Cervical, supraclavicular, and axillary nodes normal  Neurologic:   CNII-XII intact, normal strength, sensation and reflexes    throughout  .    Assessment:    Healthy female exam.    Plan:     Breast self exam technique reviewed and patient encouraged to perform self-exam monthly. Thin prep Pap smear. Wet prep.   Refill of meds

## 2015-08-10 LAB — WET PREP, GENITAL
CLUE CELLS WET PREP: NONE SEEN
TRICH WET PREP: NONE SEEN
YEAST WET PREP: NONE SEEN

## 2015-08-10 LAB — CYTOLOGY - PAP

## 2015-09-04 ENCOUNTER — Encounter: Payer: Self-pay | Admitting: Family Medicine

## 2015-09-04 ENCOUNTER — Ambulatory Visit (INDEPENDENT_AMBULATORY_CARE_PROVIDER_SITE_OTHER): Payer: BLUE CROSS/BLUE SHIELD | Admitting: Family Medicine

## 2015-09-04 VITALS — BP 103/55 | HR 67 | Ht 64.0 in | Wt 222.0 lb

## 2015-09-04 DIAGNOSIS — F418 Other specified anxiety disorders: Secondary | ICD-10-CM

## 2015-09-04 DIAGNOSIS — Z6838 Body mass index (BMI) 38.0-38.9, adult: Secondary | ICD-10-CM | POA: Diagnosis not present

## 2015-09-04 MED ORDER — FLUOXETINE HCL 40 MG PO CAPS: 40.0000 mg | ORAL_CAPSULE | Freq: Every day | ORAL | Status: DC

## 2015-09-04 NOTE — Progress Notes (Addendum)
   Subjective:    Patient ID: Sara Sanford, female    DOB: 1956/10/07, 59 y.o.   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 Scapula added stress to her daily routine. We decided to start her on medication several months ago and she is doing fantastic on it. She does still complain of overeating but denies any depression and does complain of feeling nervous several days of the week and trouble relaxing. But otherwise doing well. She rates her symptoms as not difficult at all. No S.E on the medication.  Sleeping well overall. Doesn't make her drowsy.   She has been working out daily for about an hour and a half. She has lost one more pounds since I last saw her. Before she had a significantly decreased appetite but does feel like that's coming back to normal.  Review of Systems     Objective:   Physical Exam  Constitutional: She is oriented to person, place, and time. She appears well-developed and well-nourished.  HENT:  Head: Normocephalic and atraumatic.  Cardiovascular: Normal rate, regular rhythm and normal heart sounds.   Pulmonary/Chest: Effort normal and breath sounds normal.  Neurological: She is alert and oriented to person, place, and time.  Skin: Skin is warm and dry.  Psychiatric: She has a normal mood and affect. Her behavior is normal.          Assessment & Plan:  Depression/anxiety-gad 7 score of 2 and PHQ 9 score of 1 today. This is improved from previous score of 2 and 5. Overall she's doing fantastic and is at a therapeutic level. We had a discussion about keeping her on this regimen for at least 6 months and at that point we can see how well she is doing and decide to discontinue or wean the medication if she would like.  BMI of 38-she has done fantastic with weight loss and has been able to get her BMI back under 40. Continue with diet and exercise.  Discussed need for  shingles vaccine. She is very interested but will check with her insurance first.  She declined her flu vaccine today but says she will make an appointment later to come in with her husband.

## 2015-09-10 ENCOUNTER — Emergency Department (INDEPENDENT_AMBULATORY_CARE_PROVIDER_SITE_OTHER)
Admission: EM | Admit: 2015-09-10 | Discharge: 2015-09-10 | Disposition: A | Discharge status: Home / Self Care | Payer: BLUE CROSS/BLUE SHIELD | Source: Home / Self Care | Attending: Family Medicine | Admitting: Family Medicine

## 2015-09-10 ENCOUNTER — Emergency Department (INDEPENDENT_AMBULATORY_CARE_PROVIDER_SITE_OTHER): Payer: BLUE CROSS/BLUE SHIELD

## 2015-09-10 DIAGNOSIS — S93602A Unspecified sprain of left foot, initial encounter: Secondary | ICD-10-CM | POA: Diagnosis not present

## 2015-09-10 DIAGNOSIS — M7732 Calcaneal spur, left foot: Secondary | ICD-10-CM | POA: Diagnosis not present

## 2015-09-10 NOTE — ED Notes (Signed)
At 530, back from xray, ice applied

## 2015-09-10 NOTE — ED Provider Notes (Signed)
CSN: [id number]     Arrival date & time 09/10/15  1612 History   First MD Initiated Contact with Patient 09/10/15 1654     Chief Complaint  Patient presents with  . Foot Pain     HPI Comments: [place] and twisted her left foot.  Patient is a 59 y.o. female presenting with foot injury. The history is provided by the patient.  Foot Injury Location:  Foot Time since incident:  3 hours Injury: yes   Mechanism of injury: fall   Fall:    Fall occurred: caught foot on bleachers. Foot location:  L foot Pain details:    Quality:  Aching   Radiates to:  Does not radiate   Severity:  Moderate   Onset quality:  Sudden   Duration:  3 hours   Timing:  Constant   Progression:  Unchanged Chronicity:  New Prior injury to area:  No Relieved by:  None tried Worsened by:  Bearing weight Ineffective treatments:  None tried Associated symptoms: decreased ROM, stiffness and swelling   Associated symptoms: no back pain, no muscle weakness, no numbness and no tingling   Risk factors: obesity     Past Medical History  Diagnosis Date  . Heart burn     peptic ulcer  . Obesity   . Post-menopausal   . Lichen sclerosus    Past Surgical History  Procedure Laterality Date  . Dilation and curettage of uterus    . Hysteroscopy w/d&c  2008  . Cardiac catheterization  2008  . Kidney stone surgery  1977   Family History  Problem Relation Age of Onset  . Breast cancer Paternal Aunt     pm breast CA  . Breast cancer Paternal Aunt     PM breast CA  . Heart attack Father   . Hypertension Mother   . Hypertension Father    Social History  Substance Use Topics  . Smoking status: Never Smoker   . Smokeless tobacco: Never Used  . Alcohol Use: No   OB History    Gravida Para Term Preterm AB TAB SAB Ectopic Multiple Living   4 3   1  1         Review of Systems  Musculoskeletal: Positive for stiffness. Negative for back pain.  All other systems reviewed and are  negative.   Allergies  Darvon  Home Medications   Prior to Admission medications   Medication Sig Start Date End Date Taking? Authorizing Provider  aspirin 81 MG tablet Take 81 mg by mouth daily.    Historical Provider, MD  clobetasol cream (TEMOVATE) 0.05 % Use vaginally every other night as directed  Disp a 60gm tube Apply 2-3 times per week 08/09/15   Emily Filbert, MD  FLUoxetine (PROZAC) 40 MG capsule Take 1 capsule (40 mg total) by mouth daily. One a day x 5 days then 2 a day PO 09/04/15   Hali Marry, MD  omeprazole (PRILOSEC) 20 MG capsule Take one cap each morning 30 minutes before food.  Take one cap (before eating) each evening for one week. 04/10/15   Kandra Nicolas, MD   Meds Ordered and Administered this Visit  Medications - No data to display  BP 107/73 mmHg  Pulse 88  Temp(Src) 98.3 F (36.8 C) (Oral)  Ht 5\' 4"  (1.626 m)  Wt 224 lb 4 oz (101.719 kg)  BMI 38.47 kg/m2  SpO2 96% No data found.  Physical Exam  Constitutional: She is oriented to person, place, and time. She appears well-developed and well-nourished. No distress.  Patient is obese (BMI 38.5)  HENT:  Head: Atraumatic.  Eyes: Pupils are equal, round, and reactive to light.  Pulmonary/Chest: No respiratory distress.  Musculoskeletal:       Left foot: There is tenderness, bony tenderness and swelling. There is normal range of motion, normal capillary refill, no crepitus, no deformity and no laceration.       Feet:  Left foot has swelling and tenderness to palpation dorsally as noted on diagram.    Neurological: She is alert and oriented to person, place, and time.  Skin: Skin is warm and dry.  Nursing note and vitals reviewed.   ED Course  Procedures  None   Imaging Review Dg Foot Complete Left  09/10/2015   CLINICAL DATA:  Initial encounter for fall today. Pain in medial side. Numbness in the toes.  EXAM: LEFT FOOT - COMPLETE 3+ VIEW  COMPARISON:  None.  FINDINGS: Small calcaneal  spur.  No acute fracture or dislocation.  IMPRESSION: No acute osseous abnormality.   Electronically Signed   By: Abigail Miyamoto M.D.   On: 09/10/2015 17:49     MDM   1. Foot sprain, left, initial encounter    Ace wrap applied. Apply ice pack for 30 minutes every 1 to 2 hours today and tomorrow.  Elevate.  Use crutches for 3 to 5 days.  Wear Ace wrap until swelling decreases.  Begin range of motion and stretching exercises in about 5 days as per instruction sheet.  May take Ibuprofen 200mg , 4 tabs every 8 hours with food Followup with Dr. Aundria Mems or Dr. Lynne Leader ([facility]) if not improving about two weeks.     Kandra Nicolas, MD 09/13/15 [phone]

## 2015-09-10 NOTE — ED Notes (Signed)
At [place] [employer], caught left foot on bleachers and fell.  Pain on medial side of foot, toes numb and unable to move, swelling noted above left ankle

## 2015-09-10 NOTE — Discharge Instructions (Signed)
Apply ice pack for 30 minutes every 1 to 2 hours today and tomorrow.  Elevate.  Use crutches for 3 to 5 days.  Wear Ace wrap until swelling decreases.  Begin range of motion and stretching exercises in about 5 days as per instruction sheet.  May take Ibuprofen 200mg , 4 tabs every 8 hours with food.

## 2015-09-25 ENCOUNTER — Telehealth: Payer: Self-pay

## 2015-09-25 DIAGNOSIS — Z23 Encounter for immunization: Secondary | ICD-10-CM

## 2015-09-25 MED ORDER — ZOSTER VACCINE LIVE 19400 UNT/0.65ML ~~LOC~~ SOLR: 0.6500 mL | Freq: Once | SUBCUTANEOUS | Status: DC

## 2015-09-25 NOTE — Telephone Encounter (Signed)
[facility] called states approval for shingles vaccine was given per insurance. Wanted his and wifes rx sent to [facility].

## 2015-10-24 ENCOUNTER — Encounter: Payer: Self-pay | Admitting: Osteopathic Medicine

## 2015-10-24 ENCOUNTER — Ambulatory Visit (INDEPENDENT_AMBULATORY_CARE_PROVIDER_SITE_OTHER): Payer: BLUE CROSS/BLUE SHIELD | Admitting: Osteopathic Medicine

## 2015-10-24 VITALS — BP 109/45 | HR 65 | Temp 98.2°F | Ht 64.0 in | Wt 219.0 lb

## 2015-10-24 DIAGNOSIS — R350 Frequency of micturition: Secondary | ICD-10-CM

## 2015-10-24 DIAGNOSIS — N390 Urinary tract infection, site not specified: Secondary | ICD-10-CM | POA: Diagnosis not present

## 2015-10-24 DIAGNOSIS — N3001 Acute cystitis with hematuria: Secondary | ICD-10-CM | POA: Diagnosis not present

## 2015-10-24 LAB — POCT URINALYSIS DIPSTICK
BILIRUBIN UA: NEGATIVE
Glucose, UA: NEGATIVE
NITRITE UA: POSITIVE
Protein, UA: 100
Spec Grav, UA: 1.01
Urobilinogen, UA: 4
pH, UA: 5

## 2015-10-24 MED ORDER — SULFAMETHOXAZOLE-TRIMETHOPRIM 800-160 MG PO TABS: 1.0000 | ORAL_TABLET | Freq: Two times a day (BID) | ORAL | Status: DC

## 2015-10-24 MED ORDER — PHENAZOPYRIDINE HCL 100 MG PO TABS: 100.0000 mg | ORAL_TABLET | Freq: Three times a day (TID) | ORAL | Status: DC | PRN

## 2015-10-24 NOTE — Progress Notes (Signed)
Chief Complaint: Possible UTI  History of Present Illness: Sara Sanford is a 59 y.o. female who presents to [facility]  today with concerns for acute urinary tract infection  Onset: this morning  Quality: Burning/Urgency Exacerbating factors:   Frequency: yes  Hematuria: no  Odor: no  Fever/chills: no  Incontinence: no  Flank Pain: mild Previous UTI: has had UTI in the past year, 07/2015 Recurrent UTI (3 times/more annually): not sure Abx in past 3 months: no  Complicated (any of the following): ?Diabetes ?Pregnancy ?Symptoms for seven or more days before seeking care ?Hospital acquired infection ?Renal failure ?Urinary tract obstruction ?Presence of an indwelling urethral catheter, stent, nephrostomy tube or urinary diversion ?Functional or anatomic abnormality of the urinary tract ?Renal transplantation ?Immunosuppression   Past medical, social and family history reviewed: Past Medical History  Diagnosis Date  . Heart burn     peptic ulcer  . Obesity   . Post-menopausal   . Lichen sclerosus    Past Surgical History  Procedure Laterality Date  . Dilation and curettage of uterus    . Hysteroscopy w/d&c  2008  . Cardiac catheterization  2008  . Kidney stone surgery  1977   Social History  Substance Use Topics  . Smoking status: Never Smoker   . Smokeless tobacco: Never Used  . Alcohol Use: No   The patient has a family history of  Current Outpatient Prescriptions  Medication Sig Dispense Refill  . aspirin 81 MG tablet Take 81 mg by mouth daily.    . clobetasol cream (TEMOVATE) 0.05 % Use vaginally every other night as directed  Disp a 60gm tube Apply 2-3 times per week 30 g 10  . FLUoxetine (PROZAC) 40 MG capsule Take 1 capsule (40 mg total) by mouth daily. One a day x 5 days then 2 a day PO 90 capsule 1  . omeprazole (PRILOSEC) 20 MG capsule Take one cap each morning 30 minutes before food.  Take one cap (before  eating) each evening for one week. 37 capsule 1  . zoster vaccine live, PF, (ZOSTAVAX) 09811 UNT/0.65ML injection Inject 19,400 Units into the skin once. 1 each 0   No current facility-administered medications for this visit.   Allergies  Allergen Reactions  . Darvon [Propoxyphene] Other (See Comments)    Hallucinations     Review of Systems: CONSTITUTIONAL: Negative fever/chills CARDIAC: No chest pain/pressure/palpitations, no orthopnea RESPIRATORY: No cough/shortness of breath/wheeze GASTROINTESTINAL: No nausea/vomiting/abdominal pain/blood in stool/diarrhea/constipation MUSCULOSKELETAL: No myalgia/arthralgia/back pain GENITOURINARY: Negative incontinence, Negative abnormal genital bleeding/discharge. (+)Dysuria/UTI symptoms as per HPI   Exam:  Filed Vitals:   10/24/15 1447  Height: 5\' 4"  (1.626 m)  Weight: 219 lb (99.338 kg)   Constitutional: VSS, see above. General Appearance: alert, well-developed, well-nourished, NAD Respiratory: Normal respiratory effort. Breath sounds normal, no wheeze/rhonchi/rales Cardiovascular: S1/S2 normal, no murmur/rub/gallop auscultated. RRR Gastrointestinal: Nontender, no masses. No hepatomegaly, no splenomegaly. No hernia appreciated. Rectal exam deferred.  Musculoskeletal: Gait normal. No clubbing/cyanosis of digits. Lloyd sign Negative bilateral  Results for orders placed or performed in visit on 10/24/15 (from the past 72 hour(s))  POCT Urinalysis Dipstick     Status: Abnormal   Collection Time: 10/24/15  2:54 PM  Result Value Ref Range   Color, UA     Clarity, UA     Glucose, UA NEG    Bilirubin, UA NEG    Ketones, UA TRACE    Spec Grav, UA 1.010    Blood, UA  TRACE    pH, UA 5.0    Protein, UA 100 MG    Urobilinogen, UA 4.0    Nitrite, UA POSITIVE    Leukocytes, UA large (3+) (A) Negative     ASSESSMENT/PLAN:  Frequent urination - Plan: POCT Urinalysis Dipstick  Urinary tract infection, site not specified - Plan: Urine  Culture  Acute cystitis with hematuria - Plan: sulfamethoxazole-trimethoprim (BACTRIM DS) 800-160 MG tablet, phenazopyridine (PYRIDIUM) 100 MG tablet  Would advise abx prophylaxis if UTI recur 3+/year, d/w Dr Madilyn Fireman if this is an issue.   Return sooner if symptoms worsen or fail to improve, for as directed for routine care with Dr Madilyn Fireman.

## 2015-10-26 ENCOUNTER — Telehealth: Payer: Self-pay | Admitting: *Deleted

## 2015-10-26 NOTE — Telephone Encounter (Signed)
Pt called and lvm indicating that the Bactrim that she was given for UTI is making her feel worse and she would like for Dr. Madilyn Fireman to send something else in for her to take. Will fwd to pcp for advice.Sara Sanford

## 2015-10-27 LAB — URINE CULTURE: Colony Count: >=100000

## 2015-10-27 MED ORDER — NITROFURANTOIN MONOHYD MACRO 100 MG PO CAPS: 100.0000 mg | ORAL_CAPSULE | Freq: Two times a day (BID) | ORAL | Status: DC

## 2015-10-27 NOTE — Addendum Note (Signed)
Addended by: Maryla Morrow on: 10/27/2015 11:26 AM   Modules accepted: Orders

## 2015-10-27 NOTE — Telephone Encounter (Signed)
Taken care of by Dr. Sheppard Coil.

## 2016-01-24 ENCOUNTER — Other Ambulatory Visit (HOSPITAL_COMMUNITY): Payer: Self-pay | Admitting: Obstetrics & Gynecology

## 2016-01-24 DIAGNOSIS — Z9289 Personal history of other medical treatment: Secondary | ICD-10-CM

## 2016-01-31 ENCOUNTER — Ambulatory Visit (INDEPENDENT_AMBULATORY_CARE_PROVIDER_SITE_OTHER): Payer: BLUE CROSS/BLUE SHIELD

## 2016-01-31 DIAGNOSIS — Z1231 Encounter for screening mammogram for malignant neoplasm of breast: Secondary | ICD-10-CM | POA: Diagnosis not present

## 2016-01-31 DIAGNOSIS — Z9289 Personal history of other medical treatment: Secondary | ICD-10-CM

## 2016-02-06 ENCOUNTER — Ambulatory Visit (INDEPENDENT_AMBULATORY_CARE_PROVIDER_SITE_OTHER): Payer: BLUE CROSS/BLUE SHIELD | Admitting: Osteopathic Medicine

## 2016-02-06 ENCOUNTER — Encounter: Payer: Self-pay | Admitting: Osteopathic Medicine

## 2016-02-06 VITALS — BP 102/54 | HR 66 | Temp 98.1°F | Ht 64.0 in | Wt 213.0 lb

## 2016-02-06 DIAGNOSIS — B964 Proteus (mirabilis) (morganii) as the cause of diseases classified elsewhere: Secondary | ICD-10-CM | POA: Diagnosis not present

## 2016-02-06 DIAGNOSIS — N3001 Acute cystitis with hematuria: Secondary | ICD-10-CM

## 2016-02-06 DIAGNOSIS — R350 Frequency of micturition: Secondary | ICD-10-CM

## 2016-02-06 DIAGNOSIS — N390 Urinary tract infection, site not specified: Secondary | ICD-10-CM

## 2016-02-06 LAB — POCT URINALYSIS DIPSTICK
Bilirubin, UA: NEGATIVE
GLUCOSE UA: NEGATIVE
KETONES UA: NEGATIVE
Nitrite, UA: POSITIVE
Protein, UA: NEGATIVE
Spec Grav, UA: 1.02
UROBILINOGEN UA: 0.2
pH, UA: 7

## 2016-02-06 MED ORDER — NITROFURANTOIN MONOHYD MACRO 100 MG PO CAPS: 100.0000 mg | ORAL_CAPSULE | Freq: Two times a day (BID) | ORAL | Status: DC

## 2016-02-06 NOTE — Progress Notes (Signed)
Chief Complaint: Possible UTI  History of Present Illness: Sara Sanford is a 59 y.o. female who presents to [place]  today with concerns for acute urinary tract infection  Onset: 1  Quality: Burning/Urgency Associated Symptoms:   Frequency: yes  Hematuria: no  Odor: no  Fever/chills: no  Incontinence: no  Flank Pain: yes  Vaginal bleeding/discharge: no Modifying Factors:  Previous UTI:   Recurrent UTI (3 times/more annually): yes   Past medical, social and family history reviewed: Past Medical History  Diagnosis Date  . Heart burn     peptic ulcer  . Obesity   . Post-menopausal   . Lichen sclerosus    Past Surgical History  Procedure Laterality Date  . Dilation and curettage of uterus    . Hysteroscopy w/d&c  2008  . Cardiac catheterization  2008  . Kidney stone surgery  1977   Social History  Substance Use Topics  . Smoking status: Never Smoker   . Smokeless tobacco: Never Used  . Alcohol Use: No   The patient has a family history of  Current Outpatient Prescriptions  Medication Sig Dispense Refill  . aspirin 81 MG tablet Take 81 mg by mouth daily.    . clobetasol cream (TEMOVATE) 0.05 % Use vaginally every other night as directed  Disp a 60gm tube Apply 2-3 times per week 30 g 10  . FLUoxetine (PROZAC) 40 MG capsule Take 1 capsule (40 mg total) by mouth daily. One a day x 5 days then 2 a day PO 90 capsule 1  . omeprazole (PRILOSEC) 20 MG capsule Take one cap each morning 30 minutes before food.  Take one cap (before eating) each evening for one week. 37 capsule 1  . zoster vaccine live, PF, (ZOSTAVAX) 29562 UNT/0.65ML injection Inject 19,400 Units into the skin once. 1 each 0   No current facility-administered medications for this visit.   Allergies  Allergen Reactions  . Darvon [Propoxyphene] Other (See Comments)    Hallucinations     Review of Systems: CONSTITUTIONAL: Negative fever/chills CARDIAC: No  chest pain/pressure/palpitations, no orthopnea RESPIRATORY: No cough/shortness of breath/wheeze GASTROINTESTINAL: No nausea/vomiting/abdominal pain/blood in stool/diarrhea/constipation MUSCULOSKELETAL: No myalgia/arthralgia/back pain GENITOURINARY: Negative incontinence, Negative abnormal genital bleeding/discharge. (+)Dysuria/UTI symptoms as per HPI   Exam:  Filed Vitals:   02/06/16 1100  Height: 5\' 4"  (1.626 m)  Weight: 213 lb (96.616 kg)   Constitutional: VSS, see above. General Appearance: alert, well-developed, well-nourished, NAD Respiratory: Normal respiratory effort. Breath sounds normal, no wheeze/rhonchi/rales Cardiovascular: S1/S2 normal, no murmur/rub/gallop auscultated. RRR Gastrointestinal: Nontender, no masses. No hepatomegaly, no splenomegaly. No hernia appreciated. Rectal exam deferred.  Musculoskeletal: Gait normal. No clubbing/cyanosis of digits. Lloyd sign Negative bilateral  Results for orders placed or performed in visit on 02/06/16 (from the past 24 hour(s))  POCT Urinalysis Dipstick     Status: Abnormal   Collection Time: 02/06/16 11:13 AM  Result Value Ref Range   Color, UA YELLOW    Clarity, UA CLOUDY    Glucose, UA NEG    Bilirubin, UA NEG    Ketones, UA NEG    Spec Grav, UA 1.020    Blood, UA TRACE    pH, UA 7.0    Protein, UA NEG    Urobilinogen, UA 0.2    Nitrite, UA POSITIVE    Leukocytes, UA small (1+) (A) Negative    Previous Culture Results: (+)Ecoli 10/24/15 pansensitive, (+)Ecoli 07/23/15 pansensitive   ASSESSMENT/PLAN: Patient states that last time when she  was given Bactrim it didn't improve her symptoms, urine cultures states that Bactrim should've worked against that Escherichia coli, will try Macrobid this time. Patient also advised that postmenopausal woman with vaginal atrophy/dryness can be predisposed to getting UTIs, I would follow-up with Dr. Charise Carwin about this issue if that is something that she is concerned about. This will make  her third UTI in the skin of the year, will defer to PCP whether patient is a good candidate for prophylactic antibiotics for other treatment.  Acute cystitis with hematuria - Plan: nitrofurantoin, macrocrystal-monohydrate, (MACROBID) 100 MG capsule  Urine frequency - Plan: POCT Urinalysis Dipstick, Urine Culture  Return if symptoms worsen or fail to improve, and as directed with Dr Madilyn Fireman.

## 2016-02-08 DIAGNOSIS — B964 Proteus (mirabilis) (morganii) as the cause of diseases classified elsewhere: Secondary | ICD-10-CM | POA: Insufficient documentation

## 2016-02-08 DIAGNOSIS — N390 Urinary tract infection, site not specified: Secondary | ICD-10-CM

## 2016-02-08 MED ORDER — CIPROFLOXACIN HCL 500 MG PO TABS: 500.0000 mg | ORAL_TABLET | Freq: Two times a day (BID) | ORAL | Status: DC

## 2016-02-08 NOTE — Addendum Note (Signed)
Addended by: Maryla Morrow on: 02/08/2016 08:36 AM   Modules accepted: Orders, Medications

## 2016-02-09 LAB — URINE CULTURE: Colony Count: >=100000

## 2016-07-24 ENCOUNTER — Encounter: Payer: Self-pay | Admitting: Osteopathic Medicine

## 2016-07-24 ENCOUNTER — Ambulatory Visit (INDEPENDENT_AMBULATORY_CARE_PROVIDER_SITE_OTHER): Payer: BLUE CROSS/BLUE SHIELD | Admitting: Osteopathic Medicine

## 2016-07-24 VITALS — BP 105/70 | HR 77 | Ht 63.0 in | Wt 212.0 lb

## 2016-07-24 DIAGNOSIS — N390 Urinary tract infection, site not specified: Secondary | ICD-10-CM

## 2016-07-24 DIAGNOSIS — R350 Frequency of micturition: Secondary | ICD-10-CM

## 2016-07-24 DIAGNOSIS — N3000 Acute cystitis without hematuria: Secondary | ICD-10-CM | POA: Diagnosis not present

## 2016-07-24 DIAGNOSIS — B964 Proteus (mirabilis) (morganii) as the cause of diseases classified elsewhere: Secondary | ICD-10-CM

## 2016-07-24 DIAGNOSIS — H9313 Tinnitus, bilateral: Secondary | ICD-10-CM

## 2016-07-24 DIAGNOSIS — L609 Nail disorder, unspecified: Secondary | ICD-10-CM

## 2016-07-24 LAB — POCT URINALYSIS DIPSTICK
Bilirubin, UA: NEGATIVE
Blood, UA: NEGATIVE
Glucose, UA: NEGATIVE
Ketones, UA: NEGATIVE
Leukocytes, UA: NEGATIVE
NITRITE UA: NEGATIVE
PH UA: 7
PROTEIN UA: NEGATIVE
Spec Grav, UA: 1.015
UROBILINOGEN UA: 0.2

## 2016-07-24 MED ORDER — CIPROFLOXACIN HCL 500 MG PO TABS: 500.0000 mg | ORAL_TABLET | Freq: Two times a day (BID) | ORAL | 0 refills | Status: DC

## 2016-07-24 NOTE — Patient Instructions (Signed)
Need to see PCP for annual physical - follow up on hearing concerns and nail issue  If UTI continues to be a problem, please follow up with PCP to address further workup or treatment. Let us know if you're not doing better on the medicines, or if you're getting worse.

## 2016-07-24 NOTE — Progress Notes (Signed)
Chief Complaint: Possible UTI  History of Present Illness: Sara Sanford is a 60 y.o. female who presents to [facility]  today with concerns for Chief Complaint  Patient presents with  . Urinary Frequency   Concern for UTI: Onset: 3 days ago  Started Macrobid 3 days ago  - has gotten a little bt better, but last night was bad, this morning not as much Quality: Burning/Urgency Associated Symptoms: see ROS below Context:  Previous UTI: 01/2016 (+)Proteus, 10/2015 (+)Ecoli, 07/2015 (+)Ecoli  Recurrent UTI (3 times/more annually): see Cx(+) above  Abx in past 3 months: yes  [place]ott on nail: Small dark spot under her left thumbnail, no streaking of the nail, no injury, has been there many months with no change  Hearing concern: Constant ringing in ears, but worse in the morning, no significant hearing change but she does note that her hearing isn't what it used to be.   Past medical, social and family history reviewed: Past Medical History:  Diagnosis Date  . Heart burn    peptic ulcer  . Lichen sclerosus   . Obesity   . Post-menopausal    Past Surgical History:  Procedure Laterality Date  . CARDIAC CATHETERIZATION  2008  . DILATION AND CURETTAGE OF UTERUS    . HYSTEROSCOPY W/D&C  2008  . [place]   Social History  Substance Use Topics  . Smoking status: Never Smoker  . Smokeless tobacco: Never Used  . Alcohol use No   The patient has a family history of  Current Outpatient Prescriptions  Medication Sig Dispense Refill  . aspirin 81 MG tablet Take 81 mg by mouth daily.    . clobetasol cream (TEMOVATE) 0.05 % Use vaginally every other night as directed  Disp a 60gm tube Apply 2-3 times per week 30 g 10  . FLUoxetine (PROZAC) 40 MG capsule Take 1 capsule (40 mg total) by mouth daily. One a day x 5 days then 2 a day PO 90 capsule 1  . omeprazole (PRILOSEC) 20 MG capsule Take one cap each morning 30 minutes before  food.  Take one cap (before eating) each evening for one week. 37 capsule 1  . zoster vaccine live, PF, (ZOSTAVAX) 16109 UNT/0.65ML injection Inject 19,400 Units into the skin once. 1 each 0   No current facility-administered medications for this visit.    Allergies  Allergen Reactions  . Darvon [Propoxyphene] Other (See Comments)    Hallucinations     Review of Systems: CONSTITUTIONAL: Negative fever/chills CARDIAC: No chest pain/pressure/palpitations, no orthopnea RESPIRATORY: No cough/shortness of breath/wheeze GASTROINTESTINAL: No nausea/vomiting/abdominal pain/blood in stool/diarrhea/constipation MUSCULOSKELETAL: see below re: flank pain GENITOURINARY:    Frequency: yes  Hematuria: no  Odor: no  Incontinence: no  Flank Pain: no  Vaginal bleeding/discharge: no   Exam:  BP 105/70   Pulse 77   Ht 5\' 3"  (1.6 m)   Wt 212 lb (96.2 kg)   BMI 37.55 kg/m  Constitutional: VSS, see above. General Appearance: alert, well-developed, well-nourished, NAD Respiratory: Normal respiratory effort.  Musculoskeletal: Gait normal. No clubbing/cyanosis of digits. Lloyd sign Negative bilateral Nail: small hyperpigmented round lesion under R thumbnail, no streaking, no pain    Results for orders placed or performed in visit on 07/24/16 (from the past 24 hour(s))  POCT Urinalysis Dipstick     Status: None   Collection Time: 07/24/16  1:11 PM  Result Value Ref Range   Color, UA YELLOW  Clarity, UA CLEAR    Glucose, UA NEGATIVE    Bilirubin, UA NEGATIVE    Ketones, UA NEGATIVE    Spec Grav, UA 1.015    Blood, UA NEGATIVE    pH, UA 7.0    Protein, UA NEGATIVE    Urobilinogen, UA 0.2    Nitrite, UA NEGATIVE    Leukocytes, UA Negative Negative    Previous Culture Results: see above HPI   ASSESSMENT/PLAN:   Patient had nitrofurantoin on hand at home, this was what we initially treated with, prior to getting urine culture results back and knowing that she had resistant  infection. She started taking the nitrofurantoin but feeling not much better, this may or may not affect the urine culture results, we'll escalate therapy to Cipro today, consider test of cure.  Skin under nail appears benign to me, not too concerning for melanoma, offered patient referral to dermatology for confirmation of diagnosis but she declines at this time, educated on risks versus benefits. Patient opts to keep an ionic/watchful waiting advised that she follow-up with her PCP about this issue  Hearing complaint sound like tinnitus, advised probably not much we can do for this, advised that she follow-up with her PCP about this issue  Acute cystitis without hematuria - Plan: Urine Culture, Urine Microscopic  Urinary frequency - Plan: POCT Urinalysis Dipstick  Urinary tract infection due to Proteus - Cx(+) 02/2016, previously (+)Ecoli - Plan: ciprofloxacin (CIPRO) 500 MG tablet  Nail complaint  Tinnitus, bilateral  Patient advised we will call with urine culture results once available, depending on results may need to change therapy. Return for Mal[place]. Madilyn Fireman.

## 2016-07-25 LAB — URINALYSIS, MICROSCOPIC ONLY
Bacteria, UA: NONE SEEN [HPF]
CASTS: NONE SEEN [LPF]
Crystals: NONE SEEN [HPF]
RBC / HPF: NONE SEEN RBC/HPF (ref <=2)
Squamous Epithelial / LPF: NONE SEEN [HPF] (ref <=5)
WBC, UA: NONE SEEN WBC/HPF (ref <=5)
YEAST: NONE SEEN [HPF]

## 2016-07-26 LAB — URINE CULTURE: Organism ID, Bacteria: NO GROWTH

## 2016-08-17 ENCOUNTER — Other Ambulatory Visit: Payer: Self-pay | Admitting: Obstetrics & Gynecology

## 2016-08-20 ENCOUNTER — Telehealth: Payer: Self-pay | Admitting: Family Medicine

## 2016-08-20 ENCOUNTER — Ambulatory Visit (INDEPENDENT_AMBULATORY_CARE_PROVIDER_SITE_OTHER): Payer: BLUE CROSS/BLUE SHIELD | Admitting: Family Medicine

## 2016-08-20 ENCOUNTER — Encounter: Payer: Self-pay | Admitting: Family Medicine

## 2016-08-20 VITALS — BP 115/60 | HR 67 | Ht 63.0 in | Wt 215.0 lb

## 2016-08-20 DIAGNOSIS — F411 Generalized anxiety disorder: Secondary | ICD-10-CM

## 2016-08-20 DIAGNOSIS — Z1159 Encounter for screening for other viral diseases: Secondary | ICD-10-CM

## 2016-08-20 DIAGNOSIS — Z114 Encounter for screening for human immunodeficiency virus [HIV]: Secondary | ICD-10-CM

## 2016-08-20 DIAGNOSIS — Z01419 Encounter for gynecological examination (general) (routine) without abnormal findings: Secondary | ICD-10-CM

## 2016-08-20 DIAGNOSIS — Z0000 Encounter for general adult medical examination without abnormal findings: Secondary | ICD-10-CM | POA: Diagnosis not present

## 2016-08-20 DIAGNOSIS — H9313 Tinnitus, bilateral: Secondary | ICD-10-CM

## 2016-08-20 NOTE — Patient Instructions (Addendum)

## 2016-08-20 NOTE — Telephone Encounter (Signed)
Called Pt, she is having ringing in bilateral ears. Pt prefers referral to [facility] location, [facility] would be her second option. Will place referral.

## 2016-08-20 NOTE — Progress Notes (Signed)
Subjective:     Sara Sanford is a 60 y.o. female and is here for a comprehensive physical exam. The patient reports no problems.  Follow-up generalized anxiety disorder-she's currently on fluoxetine 40 mg daily and doing really well without any side effects or problems. She would like to continue her current regimen.  She's also had some cold symptoms with nasal congestion and cough with no fever or chills. She had a mild sore throat initially. Symptoms started about 4 days ago.  Social History   Social History  . Marital status: Married    Spouse name: N/A  . Number of children: 2  . Years of education: N/A   Occupational History  . Waitress [employer]   Social History Main Topics  . Smoking status: Never Smoker  . Smokeless tobacco: Never Used  . Alcohol use No  . Drug use: No  . Sexual activity: Yes    Partners: Male   Other Topics Concern  . Not on file   Social History Narrative   2 mile walk 4 times a week.     Health Maintenance  Topic Date Due  . Hepatitis C Screening  [birth year]  . HIV Screening  08/18/1969  . INFLUENZA VACCINE  03/08/2017 (Originally 07/09/2016)  . MAMMOGRAM  01/30/2018  . PAP SMEAR  08/08/2018  . COLONOSCOPY  12/09/2020  . TETANUS/TDAP  08/03/2022  . ZOSTAVAX  Completed    The following portions of the patient's history were reviewed and updated as appropriate: allergies, current medications, past family history, past medical history, past social history, past surgical history and problem list.  Review of Systems A comprehensive review of systems was negative.   Objective:    BP 115/60 (BP Location: Left Arm, Patient Position: Sitting, Cuff Size: Normal)   Pulse 67   Ht 5\' 3"  (1.6 m)   Wt 215 lb (97.5 kg)   BMI 38.09 kg/m  General appearance: alert, cooperative and appears stated age Head: Normocephalic, without obvious abnormality, atraumatic Eyes: conj clear, EOMI, PEERLA Ears: normal TM's and external ear canals both  ears Nose: Nares normal. Septum midline. Mucosa normal. No drainage or sinus tenderness. Throat: lips, mucosa, and tongue normal; teeth and gums normal Neck: no adenopathy, no carotid bruit, no JVD, supple, symmetrical, trachea midline and thyroid not enlarged, symmetric, no tenderness/mass/nodules Back: symmetric, no curvature. ROM normal. No CVA tenderness. Lungs: clear to auscultation bilaterally Breasts: normal appearance, no masses or tenderness Heart: regular rate and rhythm, S1, S2 normal, no murmur, click, rub or gallop Abdomen: soft, non-tender; bowel sounds normal; no masses,  no organomegaly Extremities: extremities normal, atraumatic, no cyanosis or edema Pulses: 2+ and symmetric Skin: Skin color, texture, turgor normal. No rashes or lesions Lymph nodes: Cervical, supraclavicular, and axillary nodes normal. Neurologic: Alert and oriented X 3, normal strength and tone. Normal symmetric reflexes. Normal coordination and gait    Assessment:    Healthy female exam.      Plan:     See After Visit Summary for Counseling Recommendations   Keep up a regular exercise program and make sure you are eating a healthy diet Try to eat 4 servings of dairy a day, or if you are lactose intolerant take a calcium with vitamin D daily.  Your vaccines are up to date.  Due for CMP and lipid panel.  URI - Gave reassurance to call if not better in one week.   GAD 7 score of 2 today. Happy with current regimen on fluoxetine. PHQ  9 is negative. Continue current regimen. Follow-up in 6 months.

## 2016-08-20 NOTE — Telephone Encounter (Signed)
Call pt: see if she had ear ringing. I was supposed to refer to ENT but wanted to make sure it was her.

## 2016-08-31 LAB — COMPLETE METABOLIC PANEL WITH GFR
ALT: 17 U/L (ref 6–29)
AST: 22 U/L (ref 10–35)
Albumin: 4 g/dL (ref 3.6–5.1)
Alkaline Phosphatase: 72 U/L (ref 33–130)
BILIRUBIN TOTAL: 0.5 mg/dL (ref 0.2–1.2)
BUN: 14 mg/dL (ref 7–25)
CO2: 26 mmol/L (ref 20–31)
Calcium: 8.6 mg/dL (ref 8.6–10.4)
Chloride: 107 mmol/L (ref 98–110)
Creat: 0.7 mg/dL (ref 0.50–0.99)
Glucose, Bld: 86 mg/dL (ref 65–99)
Potassium: 4.1 mmol/L (ref 3.5–5.3)
Sodium: 142 mmol/L (ref 135–146)
TOTAL PROTEIN: 5.9 g/dL — AB (ref 6.1–8.1)

## 2016-08-31 LAB — LIPID PANEL
Cholesterol: 220 mg/dL — ABNORMAL HIGH (ref 125–200)
HDL: 57 mg/dL (ref >=46)
LDL CALC: 152 mg/dL — AB (ref <130)
TRIGLYCERIDES: 53 mg/dL (ref <150)
Total CHOL/HDL Ratio: 3.9 Ratio (ref <=5.0)
VLDL: 11 mg/dL (ref <30)

## 2016-08-31 LAB — HEPATITIS C ANTIBODY: HCV Ab: NEGATIVE

## 2016-08-31 LAB — HIV ANTIBODY (ROUTINE TESTING W REFLEX): HIV 1&2 Ab, 4th Generation: NONREACTIVE

## 2016-09-13 ENCOUNTER — Other Ambulatory Visit: Payer: Self-pay | Admitting: *Deleted

## 2016-09-13 MED ORDER — FLUOXETINE HCL 40 MG PO CAPS: 80.0000 mg | ORAL_CAPSULE | Freq: Every day | ORAL | 1 refills | Status: DC

## 2017-01-03 ENCOUNTER — Other Ambulatory Visit (HOSPITAL_COMMUNITY): Payer: Self-pay | Admitting: Obstetrics & Gynecology

## 2017-01-03 DIAGNOSIS — Z1231 Encounter for screening mammogram for malignant neoplasm of breast: Secondary | ICD-10-CM

## 2017-01-06 ENCOUNTER — Other Ambulatory Visit: Payer: Self-pay | Admitting: *Deleted

## 2017-01-06 DIAGNOSIS — L900 Lichen sclerosus et atrophicus: Secondary | ICD-10-CM

## 2017-01-06 MED ORDER — CLOBETASOL PROPIONATE 0.05 % EX CREA: TOPICAL_CREAM | CUTANEOUS | 10 refills | Status: DC

## 2017-01-31 ENCOUNTER — Ambulatory Visit (INDEPENDENT_AMBULATORY_CARE_PROVIDER_SITE_OTHER): Payer: BLUE CROSS/BLUE SHIELD

## 2017-01-31 DIAGNOSIS — Z1231 Encounter for screening mammogram for malignant neoplasm of breast: Secondary | ICD-10-CM | POA: Diagnosis not present

## 2017-02-17 ENCOUNTER — Ambulatory Visit: Payer: BLUE CROSS/BLUE SHIELD | Admitting: Family Medicine

## 2017-02-17 DIAGNOSIS — F411 Generalized anxiety disorder: Secondary | ICD-10-CM

## 2017-02-17 HISTORY — DX: Generalized anxiety disorder: F41.1

## 2017-02-17 NOTE — Progress Notes (Deleted)
Subjective:    CC: Mood  HPI:  GAD-   Past medical history, Surgical history, Family history not pertinant except as noted below, Social history, Allergies, and medications have been entered into the medical record, reviewed, and corrections made.   Review of Systems: No fevers, chills, night sweats, weight loss, chest pain, or shortness of breath.   Objective:    General: Well Developed, well nourished, and in no acute distress.  Neuro: Alert and oriented x3, extra-ocular muscles intact, sensation grossly intact.  HEENT: Normocephalic, atraumatic  Skin: Warm and dry, no rashes. Cardiac: Regular rate and rhythm, no murmurs rubs or gallops, no lower extremity edema.  Respiratory: Clear to auscultation bilaterally. Not using accessory muscles, speaking in full sentences.   Impression and Recommendations:    GAD-

## 2017-06-09 ENCOUNTER — Ambulatory Visit (INDEPENDENT_AMBULATORY_CARE_PROVIDER_SITE_OTHER): Payer: BLUE CROSS/BLUE SHIELD | Admitting: Osteopathic Medicine

## 2017-06-09 ENCOUNTER — Encounter: Payer: Self-pay | Admitting: Osteopathic Medicine

## 2017-06-09 VITALS — BP 103/63 | HR 64 | Temp 98.0°F | Wt 229.0 lb

## 2017-06-09 DIAGNOSIS — R309 Painful micturition, unspecified: Secondary | ICD-10-CM

## 2017-06-09 DIAGNOSIS — N3090 Cystitis, unspecified without hematuria: Secondary | ICD-10-CM | POA: Diagnosis not present

## 2017-06-09 LAB — POCT URINALYSIS DIPSTICK
Bilirubin, UA: NEGATIVE
Blood, UA: NEGATIVE
GLUCOSE UA: NEGATIVE
Ketones, UA: NEGATIVE
LEUKOCYTES UA: NEGATIVE
NITRITE UA: POSITIVE
Protein, UA: NEGATIVE
Spec Grav, UA: 1.02 (ref 1.010–1.025)
Urobilinogen, UA: 0.2 E.U./dL
pH, UA: 7.5 (ref 5.0–8.0)

## 2017-06-09 MED ORDER — CIPROFLOXACIN HCL 500 MG PO TABS: 500.0000 mg | ORAL_TABLET | Freq: Two times a day (BID) | ORAL | 0 refills | Status: DC

## 2017-06-09 NOTE — Progress Notes (Signed)
HPI: Sara Sanford is a 61 y.o. female  who presents to [facility] today, 06/09/17,  for chief complaint of:  Chief Complaint  Patient presents with  . Dysuria    x 2 days - took 1 Cipro - started AZO     . Context: 3+ UTI in past 12 mos . Quality: burning with urination  . Duration: 2 days  . Modifying factors: taking Azo and took some leftover cipro last night     Past medical history, surgical history, social history and family history reviewed.  Patient Active Problem List   Diagnosis Date Noted  . GAD (generalized anxiety disorder) 02/17/2017  . Urinary tract infection due to Proteus 02/08/2016  . Abdominal aortic aneurysm ([place]) 08/01/2014  . POLYARTHRITIS 06/19/2010  . OBESITY 05/22/2009  . GERD 05/22/2009  . LEG EDEMA, BILATERAL 05/22/2009    Current medication list and allergy/intolerance information reviewed.   Current Outpatient Prescriptions on File Prior to Visit  Medication Sig Dispense Refill  . aspirin 81 MG tablet Take 81 mg by mouth daily.    . clobetasol cream (TEMOVATE) 0.05 % Use vaginally every other night as directed  Disp a 60gm tube Apply 2-3 times per week 30 g 10  . FLUoxetine (PROZAC) 20 MG capsule TAKE ONE CAPSULE BY MOUTH ONCE DAILY FOR 5 DAYS , THEN TAKE TWO  BY MOUTH DAILY 90 capsule 12  . FLUoxetine (PROZAC) 40 MG capsule Take 2 capsules (80 mg total) by mouth daily. 2 a day PO 180 capsule 1  . omeprazole (PRILOSEC) 20 MG capsule Take one cap each morning 30 minutes before food.  Take one cap (before eating) each evening for one week. 37 capsule 1   No current facility-administered medications on file prior to visit.    Allergies  Allergen Reactions  . Darvon [Propoxyphene] Other (See Comments)    Hallucinations      Review of Systems:  Constitutional: No recent illness, no fever, no nausea  Cardiac: No  chest pain  Gastrointestinal: No  abdominal pain, no change on bowel  habits  Musculoskeletal: No new myalgia/arthralgia  Neurologic: No  weakness, No  Dizziness   Exam:  BP 103/63   Pulse 64   Temp 98 F (36.7 C) (Oral)   Wt 229 lb (103.9 kg)   SpO2 98%   BMI 40.57 kg/m   Constitutional: VS see above. General Appearance: alert, well-developed, well-nourished, NAD.  Neck: No masses, trachea midline.   Respiratory: Normal respiratory effort.   Musculoskeletal: Gait normal.   Neurological: Normal balance/coordination. No tremor.  Skin: warm, dry, intact.   Psychiatric: Normal judgment/insight. Normal mood and affect. Oriented x3.    Recent Results (from the past 2160 hour(s))  POCT Urinalysis Dipstick     Status: Abnormal   Collection Time: 06/09/17  1:11 PM  Result Value Ref Range   Color, UA orange     Comment: Patient is taking AZO   Clarity, UA clear    Glucose, UA negative    Bilirubin, UA negative    Ketones, UA negative    Spec Grav, UA 1.020 1.010 - 1.025   Blood, UA negative    pH, UA 7.5 5.0 - 8.0   Protein, UA negative    Urobilinogen, UA 0.2 0.2 or 1.0 E.U./dL   Nitrite, UA positive    Leukocytes, UA Negative Negative       ASSESSMENT/PLAN: Counseled on appropriate abx use and advised discuss UTI ppx with PCP  Cystitis - Plan: ciprofloxacin (CIPRO) 500 MG tablet, Urine Culture  Painful urination - Plan: POCT Urinalysis Dipstick     Follow-up plan: Return for routine care & discuss UTI prevention with PCP as scheduled .  Visit summary with medication list and pertinent instructions was printed for patient to review, alert [place] if any changes needed. All questions at time of visit were answered - patient instructed to contact office with any additional concerns. ER/RTC precautions were reviewed with the patient and understanding verbalized.

## 2017-06-11 LAB — URINE CULTURE: Organism ID, Bacteria: NO GROWTH

## 2017-06-16 ENCOUNTER — Ambulatory Visit (INDEPENDENT_AMBULATORY_CARE_PROVIDER_SITE_OTHER): Payer: BLUE CROSS/BLUE SHIELD | Admitting: Family Medicine

## 2017-06-16 VITALS — BP 92/42 | HR 70 | Ht 63.0 in | Wt 230.0 lb

## 2017-06-16 DIAGNOSIS — K2100 Gastro-esophageal reflux disease with esophagitis, without bleeding: Secondary | ICD-10-CM

## 2017-06-16 DIAGNOSIS — F411 Generalized anxiety disorder: Secondary | ICD-10-CM | POA: Diagnosis not present

## 2017-06-16 DIAGNOSIS — L820 Inflamed seborrheic keratosis: Secondary | ICD-10-CM

## 2017-06-16 MED ORDER — FLUOXETINE HCL 40 MG PO CAPS: 40.0000 mg | ORAL_CAPSULE | Freq: Every day | ORAL | 1 refills | Status: DC

## 2017-06-16 NOTE — Progress Notes (Signed)
Subjective:    CC: Anxiety  HPI:  6 mo f/u for anxiety - she is on 40mg  of prozac. She is doing very well. She is happy with her regimen and does not want to discontinue it at this point.  GERD-she does take her omeprazole daily. She heard about some potential side effects and wants to discuss that today.  Skin lesion on theleft chest has been present for year but feels liek the center is elevating. His not painful itchy or burning. It just has changed shape.  Past medical history, Surgical history, Family history not pertinant except as noted below, Social history, Allergies, and medications have been entered into the medical record, reviewed, and corrections made.   Review of Systems: No fevers, chills, night sweats, weight loss, chest pain, or shortness of breath.   Objective:    General: Well Developed, well nourished, and in no acute distress.  Neuro: Alert and oriented x3, extra-ocular muscles intact, sensation grossly intact.  HEENT: Normocephalic, atraumatic  Skin: Warm and dry, no rashes.She has a brown circular lesion on left upper chest and in the middle is a more raised dry appearing area that is more like color. No rough textured indicate a wart and no pearly appearance. Cardiac: Regular rate and rhythm, no murmurs rubs or gallops, no lower extremity edema.  Respiratory: Clear to auscultation bilaterally. Not using accessory muscles, speaking in full sentences.   Impression and Recommendations:   Generalized anxiety disorder-PHQ 9 score of 2. Overall she is doing well and very happy with her regimen. Refills sent for the next 6 months and will follow-up then.  GERD-discussed long-term effects of PPIs including risk for fracture and possibly dementia. Encouraged her to consider weaning the medication.  Inflamed seborrheic keratosis-discussed options for treatment.  cryotherapy performed. Patient tolerated well. If lesion recurs then recommend shave biopsy for further  evaluation.  Cryotherapy Procedure Note  Pre-operative Diagnosis: Seborrheic keratosis  Post-operative Diagnosis: same  Locations: left upper chest Indications: Getting larger and becoming more raised.  Anesthesia: not required    Procedure Details  Patient informed of risks (permanent scarring, infection, light or dark discoloration, bleeding, infection, weakness, numbness and recurrence of the lesion) and benefits of the procedure and verbal informed consent obtained.  The areas are treated with liquid nitrogen therapy, frozen until ice ball extended 2-3 mm beyond lesion, allowed to thaw, and treated again. The patient tolerated procedure well.  The patient was instructed on post-op care, warned that there may be blister formation, redness and pain. Recommend OTC analgesia as needed for pain.  Condition: Stable  Complications: none.  Plan: 1. Instructed to keep the area dry and covered for 24-48h and clean thereafter. 2. Warning signs of infection were reviewed.   3. Recommended that the patient use OTC acetaminophen as needed for pain.  4. Return if lesion recurs, will need shave biopsy.

## 2017-12-23 ENCOUNTER — Other Ambulatory Visit: Payer: Self-pay | Admitting: Family Medicine

## 2017-12-23 DIAGNOSIS — Z1231 Encounter for screening mammogram for malignant neoplasm of breast: Secondary | ICD-10-CM

## 2018-01-05 ENCOUNTER — Ambulatory Visit: Payer: BLUE CROSS/BLUE SHIELD | Admitting: Family Medicine

## 2018-02-04 ENCOUNTER — Ambulatory Visit (INDEPENDENT_AMBULATORY_CARE_PROVIDER_SITE_OTHER): Payer: BLUE CROSS/BLUE SHIELD

## 2018-02-04 DIAGNOSIS — Z1231 Encounter for screening mammogram for malignant neoplasm of breast: Secondary | ICD-10-CM | POA: Diagnosis not present

## 2018-02-09 ENCOUNTER — Other Ambulatory Visit: Payer: Self-pay | Admitting: *Deleted

## 2018-02-09 MED ORDER — FLUOXETINE HCL 40 MG PO CAPS: 40.0000 mg | ORAL_CAPSULE | Freq: Every day | ORAL | 0 refills | Status: DC

## 2018-02-11 ENCOUNTER — Ambulatory Visit (INDEPENDENT_AMBULATORY_CARE_PROVIDER_SITE_OTHER): Payer: BLUE CROSS/BLUE SHIELD | Admitting: Family Medicine

## 2018-02-11 ENCOUNTER — Encounter: Payer: Self-pay | Admitting: Family Medicine

## 2018-02-11 VITALS — BP 116/55 | HR 69

## 2018-02-11 DIAGNOSIS — R1031 Right lower quadrant pain: Secondary | ICD-10-CM | POA: Diagnosis not present

## 2018-02-11 DIAGNOSIS — R319 Hematuria, unspecified: Secondary | ICD-10-CM

## 2018-02-11 DIAGNOSIS — I77811 Abdominal aortic ectasia: Secondary | ICD-10-CM

## 2018-02-11 DIAGNOSIS — R300 Dysuria: Secondary | ICD-10-CM

## 2018-02-11 DIAGNOSIS — N3001 Acute cystitis with hematuria: Secondary | ICD-10-CM | POA: Diagnosis not present

## 2018-02-11 LAB — POCT URINALYSIS DIPSTICK
BILIRUBIN UA: NEGATIVE
GLUCOSE UA: NEGATIVE
Ketones, UA: NEGATIVE
Nitrite, UA: POSITIVE
Protein, UA: NEGATIVE
Spec Grav, UA: 1.02 (ref 1.010–1.025)
Urobilinogen, UA: 0.2 E.U./dL
pH, UA: 6 (ref 5.0–8.0)

## 2018-02-11 MED ORDER — SULFAMETHOXAZOLE-TRIMETHOPRIM 800-160 MG PO TABS: 1.0000 | ORAL_TABLET | Freq: Two times a day (BID) | ORAL | 0 refills | Status: DC

## 2018-02-11 MED ORDER — FLUOXETINE HCL 40 MG PO CAPS: 40.0000 mg | ORAL_CAPSULE | Freq: Every day | ORAL | 0 refills | Status: DC

## 2018-02-11 NOTE — Patient Instructions (Addendum)

## 2018-02-11 NOTE — Progress Notes (Signed)
Subjective:    Patient ID: Sara Sanford, female    DOB: 09/24/1956, 62 y.o.   MRN: [id number]  HPI   62 year old female comes in today complaining of 1 day of urinary frequency and burning.  She has had some low back pain with it.  And some chills.  No fever but she has been taking some Azo-Standard.  She says about 3 or 4 weeks ago she was having some intermittent right lower quadrant pain that was happening very frequently.  She denies any known pattern to it.  It was not aggravated by eating.  She felt like her bowels move well she did not feel constipated.  She was not having any blood in her stool.  No nausea with it.  She said she was going to come in at one point.  But then it gradually started to become less frequent and less bothersome though she still has occasional episodes.  Also says that she had an imaging study done several years ago and she was supposed to have a follow-up ultrasound in August 2018.  She had it marked on her calendar but just cannot remember the specifics.  Review of Systems   BP (!) 116/55   Pulse 69   SpO2 99%     Allergies  Allergen Reactions  . Darvon [Propoxyphene] Other (See Comments)    Hallucinations    Past Medical History:  Diagnosis Date  . Heart burn    peptic ulcer  . Lichen sclerosus   . Obesity   . Post-menopausal     Past Surgical History:  Procedure Laterality Date  . CARDIAC CATHETERIZATION  2008  . DILATION AND CURETTAGE OF UTERUS    . HYSTEROSCOPY W/D&C  2008  . KIDNEY STONE SURGERY  1977    Social History   Socioeconomic History  . Marital status: Married    Spouse name: Not on file  . Number of children: 2  . Years of education: Not on file  . Highest education level: Not on file  Social Needs  . Financial resource strain: Not on file  . Food insecurity - worry: Not on file  . Food insecurity - inability: Not on file  . Transportation needs - medical: Not on file  . Transportation needs - non-medical:  Not on file  Occupational History  . Occupation: Cytogeneticist: [employer]  Tobacco Use  . Smoking status: Never Smoker  . Smokeless tobacco: Never Used  Substance and Sexual Activity  . Alcohol use: No  . Drug use: No  . Sexual activity: Yes    Partners: Male  Other Topics Concern  . Not on file  Social History Narrative   2 mile walk 4 times a week.      Family History  Problem Relation Age of Onset  . Breast cancer Paternal Aunt        pm breast CA  . Breast cancer Paternal Aunt        PM breast CA  . Heart attack Father   . Hypertension Mother   . Hypertension Father     Outpatient Encounter Medications as of 02/11/2018  Medication Sig  . aspirin 81 MG tablet Take 81 mg by mouth daily.  . clobetasol cream (TEMOVATE) 0.05 % Use vaginally every other night as directed  Disp a 60gm tube Apply 2-3 times per week  . FLUoxetine (PROZAC) 40 MG capsule Take 1 capsule (40 mg total) by mouth daily. PLEASE CALL OFFICE TO  SCHEDULE AN APPOINTMENT.  Marland Kitchen omeprazole (PRILOSEC) 20 MG capsule Take one cap each morning 30 minutes before food.  Take one cap (before eating) each evening for one week.  . [DISCONTINUED] FLUoxetine (PROZAC) 40 MG capsule Take 1 capsule (40 mg total) by mouth daily. LAST REFILL. PLEASE CALL OFFICE TO SCHEDULE AN APPOINTMENT.15 DAY SUPPLY GIVEN  . sulfamethoxazole-trimethoprim (BACTRIM DS,SEPTRA DS) 800-160 MG tablet Take 1 tablet by mouth 2 (two) times daily.   No facility-administered encounter medications on file as of 02/11/2018.           Objective:   Physical Exam  Constitutional: She is oriented to person, place, and time. She appears well-developed and well-nourished.  HENT:  Head: Normocephalic and atraumatic.  Cardiovascular: Normal rate, regular rhythm and normal heart sounds.  Pulmonary/Chest: Effort normal and breath sounds normal.  Abdominal: Soft. Bowel sounds are normal. She exhibits no distension and no mass. There is no tenderness.  There is no rebound and no guarding.  Musculoskeletal:  No CVA tenderness  Neurological: She is alert and oriented to person, place, and time.  Skin: Skin is warm and dry.  Psychiatric: She has a normal mood and affect. Her behavior is normal.       Assessment & Plan:  UTI - will tx with Bactrim DS.  Call if nto better in 5 days. Recheck urine for blood in 2 weeks.    Hematuria - likely from infection but recheck in 2 weeks.    RLQ pain -unclear etiology.  Her bowels seem to be moving normally and the pain is much less frequent than it was several weeks ago. Exam is completley benign.   We will keep an eye on things.  If it continues or suddenly gets worse again then we can do additional workup.  Went back and reviewed her imaging.  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  They recommended repeat ultrasound in 3 years to see if the area was growing in diameter.  Refilled her prozac today but really needs to schedule a follow up appt before next refill.

## 2018-02-12 LAB — URINALYSIS, ROUTINE W REFLEX MICROSCOPIC
Bilirubin Urine: NEGATIVE
Glucose, UA: NEGATIVE
Hyaline Cast: NONE SEEN /LPF
Ketones, ur: NEGATIVE
Nitrite: POSITIVE — AB
PROTEIN: NEGATIVE
Specific Gravity, Urine: 1.018 (ref 1.001–1.03)
Squamous Epithelial / LPF: NONE SEEN /HPF (ref <=5)
pH: 6 (ref 5.0–8.0)

## 2018-03-05 ENCOUNTER — Ambulatory Visit (HOSPITAL_BASED_OUTPATIENT_CLINIC_OR_DEPARTMENT_OTHER)
Admission: RE | Admit: 2018-03-05 | Discharge: 2018-03-05 | Disposition: A | Discharge status: Home / Self Care | Payer: BLUE CROSS/BLUE SHIELD | Source: Ambulatory Visit | Attending: Family Medicine | Admitting: Family Medicine

## 2018-03-05 DIAGNOSIS — I714 Abdominal aortic aneurysm, without rupture: Secondary | ICD-10-CM | POA: Diagnosis not present

## 2018-03-05 DIAGNOSIS — I77811 Abdominal aortic ectasia: Secondary | ICD-10-CM | POA: Insufficient documentation

## 2018-03-05 NOTE — Progress Notes (Signed)
   Abdomenal aorta duplex performed ReelAlyson Locket 03/05/2018, 10:14 AM

## 2018-05-18 ENCOUNTER — Other Ambulatory Visit: Payer: Self-pay | Admitting: Family Medicine

## 2018-05-18 ENCOUNTER — Other Ambulatory Visit: Payer: Self-pay | Admitting: *Deleted

## 2018-06-25 ENCOUNTER — Ambulatory Visit (INDEPENDENT_AMBULATORY_CARE_PROVIDER_SITE_OTHER): Payer: BLUE CROSS/BLUE SHIELD | Admitting: Family Medicine

## 2018-06-25 ENCOUNTER — Encounter: Payer: Self-pay | Admitting: Family Medicine

## 2018-06-25 VITALS — BP 102/56 | HR 68 | Ht 63.0 in | Wt 243.0 lb

## 2018-06-25 DIAGNOSIS — R300 Dysuria: Secondary | ICD-10-CM

## 2018-06-25 DIAGNOSIS — R10A1 Flank pain, right side: Secondary | ICD-10-CM

## 2018-06-25 DIAGNOSIS — R109 Unspecified abdominal pain: Secondary | ICD-10-CM

## 2018-06-25 DIAGNOSIS — N3000 Acute cystitis without hematuria: Secondary | ICD-10-CM

## 2018-06-25 LAB — POCT URINALYSIS DIPSTICK
BILIRUBIN UA: NEGATIVE
GLUCOSE UA: NEGATIVE
Ketones, UA: NEGATIVE
LEUKOCYTES UA: NEGATIVE
Nitrite, UA: NEGATIVE
Protein, UA: NEGATIVE
Spec Grav, UA: 1.01 (ref 1.010–1.025)
Urobilinogen, UA: 0.2 E.U./dL
pH, UA: 6.5 (ref 5.0–8.0)

## 2018-06-25 MED ORDER — NITROFURANTOIN MONOHYD MACRO 100 MG PO CAPS: 100.0000 mg | ORAL_CAPSULE | Freq: Two times a day (BID) | ORAL | 0 refills | Status: AC

## 2018-06-25 NOTE — Progress Notes (Signed)
Subjective:    Patient ID: Sara Sanford, female    DOB: 06-May-1956, 62 y.o.   MRN: [id number]  HPI 62 year old female comes in today complaining of dysuria for 1 day.  She is had some urgency as well as some chills and back pain but no significant abdominal pain.  Taking AZO.     She also c/o of right flank pain that stars near her back and radiates around to her right hip.  She says this is actually been going on for about a month.  She says the pain is more mild and annoying but is not severe and has not kept her from doing anything.  She has been doing a little bit more lifting since her mom was on hospice and has been helping her to the potty chair but says it actually started bothering her before that.  No blood in the urine.  No change in bowels.  She does not feel constipated.   Review of Systems  BP (!) 102/56   Pulse 68   Ht 5\' 3"  (1.6 m)   Wt 243 lb (110.2 kg)   SpO2 99%   BMI 43.05 kg/m     Allergies  Allergen Reactions  . Darvon [Propoxyphene] Other (See Comments)    Hallucinations    Past Medical History:  Diagnosis Date  . Heart burn    peptic ulcer  . Lichen sclerosus   . Obesity   . Post-menopausal     Past Surgical History:  Procedure Laterality Date  . CARDIAC CATHETERIZATION  2008  . DILATION AND CURETTAGE OF UTERUS    . HYSTEROSCOPY W/D&C  2008  . KIDNEY STONE SURGERY  1977    Social History   Socioeconomic History  . Marital status: Married    Spouse name: Not on file  . Number of children: 2  . Years of education: Not on file  . Highest education level: Not on file  Occupational History  . Occupation: Cytogeneticist: [employer]  Social Needs  . Financial resource strain: Not on file  . Food insecurity:    Worry: Not on file    Inability: Not on file  . Transportation needs:    Medical: Not on file    Non-medical: Not on file  Tobacco Use  . Smoking status: Never Smoker  . Smokeless tobacco: Never Used  Substance and  Sexual Activity  . Alcohol use: No  . Drug use: No  . Sexual activity: Yes    Partners: Male  Lifestyle  . Physical activity:    Days per week: Not on file    Minutes per session: Not on file  . Stress: Not on file  Relationships  . Social connections:    Talks on phone: Not on file    Gets together: Not on file    Attends religious service: Not on file    Active member of club or organization: Not on file    Attends meetings of clubs or organizations: Not on file    Relationship status: Not on file  . Intimate partner violence:    Fear of current or ex partner: Not on file    Emotionally abused: Not on file    Physically abused: Not on file    Forced sexual activity: Not on file  Other Topics Concern  . Not on file  Social History Narrative   2 mile walk 4 times a week.      Family History  Problem Relation Age of Onset  . Breast cancer Paternal Aunt        pm breast CA  . Breast cancer Paternal Aunt        PM breast CA  . Heart attack Father   . Hypertension Mother   . Hypertension Father     Outpatient Encounter Medications as of 06/25/2018  Medication Sig  . aspirin 81 MG tablet Take 81 mg by mouth daily.  . clobetasol cream (TEMOVATE) 0.05 % Use vaginally every other night as directed  Disp a 60gm tube Apply 2-3 times per week  . FLUoxetine (PROZAC) 40 MG capsule Take 1 capsule (40 mg total) by mouth daily. [place]. YOU MUST MAKE AND KEEP APPOINTMENT FOR REFILLS  . omeprazole (PRILOSEC) 20 MG capsule Take one cap each morning 30 minutes before food.  Take one cap (before eating) each evening for one week.  . nitrofurantoin, macrocrystal-monohydrate, (MACROBID) 100 MG capsule Take 1 capsule (100 mg total) by mouth 2 (two) times daily for 5 days.  . [DISCONTINUED] sulfamethoxazole-trimethoprim (BACTRIM DS,SEPTRA DS) 800-160 MG tablet Take 1 tablet by mouth 2 (two) times daily.   No facility-administered encounter medications on file as of 06/25/2018.           Objective:   Physical Exam  Constitutional: She is oriented to person, place, and time. She appears well-developed and well-nourished.  HENT:  Head: Normocephalic and atraumatic.  Eyes: Conjunctivae and EOM are normal.  Cardiovascular: Normal rate.  Pulmonary/Chest: Effort normal.  Abdominal: Soft. Bowel sounds are normal. She exhibits no distension and no mass. There is no tenderness. There is no rebound and no guarding. No hernia.  Musculoskeletal:  nontender over her spine paraspinous muscles.  Nontender over the flank area.  She is tender just right over the right anterior hip.  Neurological: She is alert and oriented to person, place, and time.  Skin: Skin is dry. No pallor.  Psychiatric: She has a normal mood and affect. Her behavior is normal.  Vitals reviewed.       Assessment & Plan:  Dysuria-possible urinary tract infection the urinalysis was negative for leukocytes or nitrites but she is had classic symptoms so I am to go ahead and treat her with nitrofurantoin over the weekend while we send a culture.  There was some trace lysed blood so we will send for micro review to evaluate for whole red blood cells.  Right flank pain-unclear etiology.  It does not sound intense enough to be kidney stones and she is had this before and says this feels different.  Diverticulitis is less likely as it small on the right.  She has not had any change in bowel.  If she is not improving then consider further work-up.  Could be more muscular skeletal coming from her back especially since she is been doing more lifting.  Recommend heat and gentle stretches and anti-inflammatory as needed.

## 2018-06-25 NOTE — Addendum Note (Signed)
Addended by: Teddy Spike on: 06/25/2018 03:48 PM   Modules accepted: Orders

## 2018-06-27 LAB — URINE CULTURE
MICRO NUMBER:: 90852795
SPECIMEN QUALITY: ADEQUATE

## 2018-07-13 ENCOUNTER — Ambulatory Visit (INDEPENDENT_AMBULATORY_CARE_PROVIDER_SITE_OTHER): Payer: BLUE CROSS/BLUE SHIELD | Admitting: Family Medicine

## 2018-07-13 ENCOUNTER — Encounter: Payer: Self-pay | Admitting: Family Medicine

## 2018-07-13 VITALS — BP 109/52 | HR 74 | Ht 63.0 in | Wt 245.0 lb

## 2018-07-13 DIAGNOSIS — F411 Generalized anxiety disorder: Secondary | ICD-10-CM | POA: Diagnosis not present

## 2018-07-13 DIAGNOSIS — I714 Abdominal aortic aneurysm, without rupture, unspecified: Secondary | ICD-10-CM

## 2018-07-13 DIAGNOSIS — Z79899 Other long term (current) drug therapy: Secondary | ICD-10-CM | POA: Diagnosis not present

## 2018-07-13 DIAGNOSIS — L989 Disorder of the skin and subcutaneous tissue, unspecified: Secondary | ICD-10-CM

## 2018-07-13 MED ORDER — FLUOXETINE HCL 20 MG PO TABS: 20.0000 mg | ORAL_TABLET | Freq: Every day | ORAL | 3 refills | Status: DC

## 2018-07-13 NOTE — Progress Notes (Signed)
Subjective:    Patient ID: Sara Sanford, female    DOB: 08-12-1956, 62 y.o.   MRN: [id number]  HPI 62 year old female is here today for follow-up for generalized anxiety disorder.  She is currently on fluoxetine daily.  She denies feeling nervous on her edge.  She denies feeling like she excessively worries.  She rates her symptoms as not difficult.  Has a skin lesion on her left upper outer arm she would like me to look at today.  It is not really bothersome.  Really her daughter noticed it.  Abdominal aortic aneurysm-this was noted on imaging in August 2015.  She repeated her scan in March and it was still measuring 3.2 cm so no significant change after 3 years.  They recommended repeat exam in 3 years again.  Review of Systems   BP (!) 109/52   Pulse 74   Ht 5\' 3"  (1.6 m)   Wt 245 lb (111.1 kg)   SpO2 99%   BMI 43.40 kg/m     Allergies  Allergen Reactions  . Darvon [Propoxyphene] Other (See Comments)    Hallucinations    Past Medical History:  Diagnosis Date  . Heart burn    peptic ulcer  . Lichen sclerosus   . Obesity   . Post-menopausal     Past Surgical History:  Procedure Laterality Date  . CARDIAC CATHETERIZATION  2008  . DILATION AND CURETTAGE OF UTERUS    . HYSTEROSCOPY W/D&C  2008  . KIDNEY STONE SURGERY  1977    Social History   Socioeconomic History  . Marital status: Married    Spouse name: Not on file  . Number of children: 2  . Years of education: Not on file  . Highest education level: Not on file  Occupational History  . Occupation: Cytogeneticist: [employer]  Social Needs  . Financial resource strain: Not on file  . Food insecurity:    Worry: Not on file    Inability: Not on file  . Transportation needs:    Medical: Not on file    Non-medical: Not on file  Tobacco Use  . Smoking status: Never Smoker  . Smokeless tobacco: Never Used  Substance and Sexual Activity  . Alcohol use: No  . Drug use: No  . Sexual activity:  Yes    Partners: Male  Lifestyle  . Physical activity:    Days per week: Not on file    Minutes per session: Not on file  . Stress: Not on file  Relationships  . Social connections:    Talks on phone: Not on file    Gets together: Not on file    Attends religious service: Not on file    Active member of club or organization: Not on file    Attends meetings of clubs or organizations: Not on file    Relationship status: Not on file  . Intimate partner violence:    Fear of current or ex partner: Not on file    Emotionally abused: Not on file    Physically abused: Not on file    Forced sexual activity: Not on file  Other Topics Concern  . Not on file  Social History Narrative   2 mile walk 4 times a week.      Family History  Problem Relation Age of Onset  . Breast cancer Paternal Aunt        pm breast CA  . Breast cancer Paternal Aunt  PM breast CA  . Heart attack Father   . Hypertension Mother   . Hypertension Father     Outpatient Encounter Medications as of 07/13/2018  Medication Sig  . clobetasol cream (TEMOVATE) 0.05 % Use vaginally every other night as directed  Disp a 60gm tube Apply 2-3 times per week  . omeprazole (PRILOSEC) 20 MG capsule Take one cap each morning 30 minutes before food.  Take one cap (before eating) each evening for one week.  . [DISCONTINUED] aspirin 81 MG tablet Take 81 mg by mouth daily.  . [DISCONTINUED] FLUoxetine (PROZAC) 40 MG capsule Take 1 capsule (40 mg total) by mouth daily. [place]. YOU MUST MAKE AND KEEP APPOINTMENT FOR REFILLS  . FLUoxetine (PROZAC) 20 MG tablet Take 1 tablet (20 mg total) by mouth daily.   No facility-administered encounter medications on file as of 07/13/2018.          Objective:   Physical Exam  Constitutional: She is oriented to person, place, and time. She appears well-developed and well-nourished.  HENT:  Head: Normocephalic and atraumatic.  Cardiovascular: Normal rate, regular rhythm  and normal heart sounds.  Pulmonary/Chest: Effort normal and breath sounds normal.  Neurological: She is alert and oriented to person, place, and time.  Skin: Skin is warm and dry.  Psychiatric: She has a normal mood and affect. Her behavior is normal.        Assessment & Plan:  GAD - GAD -7 score of 0.  Overall she is doing fantastic.  Negative depression screen.  In fact she is ready to try decreasing her dose down to 20 mg.  New prescription sent to pharmacy.  Follow-up in 6 to 12 months.  Skin lesion-most consistent with a seborrheic keratosis.  Likely benign.  Definitive treatment would be cryotherapy and or shave biopsy.  Patient will continue to watch it.  Discussed  shingles vaccine.  Additional information provided.  Patient will check on insurance coverage.  Abdominal aortic aneurysm-3.2 cm seen on ultrasound in August 2015 and again in 02/2018. Repeat in 3 years.

## 2018-07-14 ENCOUNTER — Other Ambulatory Visit: Payer: Self-pay

## 2018-07-14 ENCOUNTER — Emergency Department (INDEPENDENT_AMBULATORY_CARE_PROVIDER_SITE_OTHER)
Admission: EM | Admit: 2018-07-14 | Discharge: 2018-07-14 | Disposition: A | Discharge status: Home / Self Care | Payer: BLUE CROSS/BLUE SHIELD | Source: Home / Self Care | Attending: Family Medicine | Admitting: Family Medicine

## 2018-07-14 ENCOUNTER — Encounter: Payer: Self-pay | Admitting: Emergency Medicine

## 2018-07-14 ENCOUNTER — Emergency Department (INDEPENDENT_AMBULATORY_CARE_PROVIDER_SITE_OTHER): Payer: BLUE CROSS/BLUE SHIELD

## 2018-07-14 DIAGNOSIS — W010XXA Fall on same level from slipping, tripping and stumbling without subsequent striking against object, initial encounter: Secondary | ICD-10-CM

## 2018-07-14 DIAGNOSIS — S6391XA Sprain of unspecified part of right wrist and hand, initial encounter: Secondary | ICD-10-CM

## 2018-07-14 DIAGNOSIS — M25531 Pain in right wrist: Secondary | ICD-10-CM | POA: Diagnosis not present

## 2018-07-14 DIAGNOSIS — S5012XA Contusion of left forearm, initial encounter: Secondary | ICD-10-CM

## 2018-07-14 DIAGNOSIS — M79641 Pain in right hand: Secondary | ICD-10-CM

## 2018-07-14 DIAGNOSIS — S50812A Abrasion of left forearm, initial encounter: Secondary | ICD-10-CM

## 2018-07-14 DIAGNOSIS — S63501A Unspecified sprain of right wrist, initial encounter: Secondary | ICD-10-CM

## 2018-07-14 NOTE — ED Triage Notes (Signed)
Patient fell yesterday on concrete and her right hand and wrist remain sore; abrasion on left lower arm. Took tylenol one hour ago.

## 2018-07-14 NOTE — Discharge Instructions (Signed)
°  You may remove the wrist splint to wash your hands and to apply a cool compress 2-3 times a day the next few days to help with swelling but you should wear the splint at all other times including while sleeping in case there is an occult (small hidden fracture).  If you still have pain and swelling in 1-2 weeks, please follow up with family medicine or sports medicine. They may repeat and x-ray to make sure there is no hidden fracture.   Keep wound on Left arm clean with warm water and mild soap. You may apply an over the counter antibiotic ointment for the next 3-5 days and cover with a bandage to help keep protected as it heals.

## 2018-07-14 NOTE — ED Provider Notes (Signed)
[name]    CSN: [id number] Arrival date & time: 07/14/18  1302     History   Chief Complaint Chief Complaint  Patient presents with  . Hand Pain  . Wrist Pain    HPI Sara Sanford is a 62 y.o. female.   HPI Sara Sanford is a 62 y.o. female presenting to UC with c/o Right wrist and hand pain with swelling along with an abrasion to her Left forearm that happened yesterday after tripping over a large rock and falling. Denies hitting her head or LOC. No other injuries. Wrist pain is most bothersome. It was 9/10 last night. Improved to 6/10 after taking Tylenol Arthritis about 1 hour PTA.    Past Medical History:  Diagnosis Date  . Heart burn    peptic ulcer  . Lichen sclerosus   . Obesity   . Post-menopausal     Patient Active Problem List   Diagnosis Date Noted  . GAD (generalized anxiety disorder) 02/17/2017  . Abdominal aortic aneurysm ([place]) 08/01/2014  . POLYARTHRITIS 06/19/2010  . OBESITY 05/22/2009  . GERD 05/22/2009  . LEG EDEMA, BILATERAL 05/22/2009    Past Surgical History:  Procedure Laterality Date  . CARDIAC CATHETERIZATION  2008  . DILATION AND CURETTAGE OF UTERUS    . HYSTEROSCOPY W/D&C  2008  . KIDNEY STONE SURGERY  1977    OB History    Gravida  4   Para  3   Term      Preterm      AB  1   Living        SAB  1   TAB      Ectopic      Multiple      Live Births               Home Medications    Prior to Admission medications   Medication Sig Start Date End Date Taking? Authorizing Provider  clobetasol cream (TEMOVATE) 0.05 % Use vaginally every other night as directed  Disp a 60gm tube Apply 2-3 times per week 01/06/17   Clovia Cuff C, MD  FLUoxetine (PROZAC) 20 MG tablet Take 1 tablet (20 mg total) by mouth daily. 07/13/18   Hali Marry, MD  omeprazole (PRILOSEC) 20 MG capsule Take one cap each morning 30 minutes before food.  Take one cap (before eating) each evening for one week.  04/10/15   Kandra Nicolas, MD    Family History Family History  Problem Relation Age of Onset  . Breast cancer Paternal Aunt        pm breast CA  . Breast cancer Paternal Aunt        PM breast CA  . Heart attack Father   . Hypertension Father   . Hypertension Mother     Social History Social History   Tobacco Use  . Smoking status: Never Smoker  . Smokeless tobacco: Never Used  Substance Use Topics  . Alcohol use: No  . Drug use: No     Allergies   Darvon [propoxyphene]   Review of Systems Review of Systems  Musculoskeletal: Positive for arthralgias, joint swelling and myalgias. Negative for back pain, neck pain and neck stiffness.  Skin: Positive for wound. Negative for color change.     Physical Exam Triage Vital Signs ED Triage Vitals  Enc Vitals Group     BP 07/14/18 1320 104/68     Pulse Rate 07/14/18 1320 76  Resp 07/14/18 1320 18     Temp 07/14/18 1320 98 F (36.7 C)     Temp Source 07/14/18 1320 Oral     SpO2 07/14/18 1320 96 %     Weight --      Height --      Head Circumference --      Peak Flow --      Pain Score 07/14/18 1321 6     Pain Loc --      Pain Edu? --      Excl. in [place]? --    No data found.  Updated Vital Signs BP 104/68 (BP Location: Left Arm)   Pulse 76   Temp 98 F (36.7 C) (Oral)   Resp 18   SpO2 96%   Visual Acuity Right Eye Distance:   Left Eye Distance:   Bilateral Distance:    Right Eye Near:   Left Eye Near:    Bilateral Near:     Physical Exam  Constitutional: She is oriented to person, place, and time. She appears well-developed and well-nourished.  HENT:  Head: Normocephalic and atraumatic.  Eyes: EOM are normal.  Neck: Normal range of motion.  Cardiovascular: Normal rate.  Pulses:      Radial pulses are 2+ on the right side, and 2+ on the left side.  Pulmonary/Chest: Effort normal.  Musculoskeletal: She exhibits edema and tenderness.  Right hand and wrist: mild to moderate edema. Tenderness  along radial aspect. Decreased flexion and extension of wrist due to pain. 4/5 grip strength.  Right elbow: non-tender. Full ROM  Neurological: She is alert and oriented to person, place, and time.  Skin: Skin is warm and dry. Capillary refill takes less than 2 seconds.  Left forearm: 2cm superficial abrasion. No foreign bodies. No active bleeding. Right hand and wrist: skin in tact. No ecchymosis or erythema  Psychiatric: She has a normal mood and affect. Her behavior is normal.  Nursing note and vitals reviewed.    UC Treatments / Results  Labs (all labs ordered are listed, but only abnormal results are displayed) Labs Reviewed - No data to display  EKG None  Radiology Dg Wrist Complete Right  Result Date: 07/14/2018 CLINICAL DATA:  RT wrist pain after fall x 1 day ago EXAM: RIGHT WRIST - COMPLETE 3+ VIEW COMPARISON:  None. FINDINGS: There is degenerative change in the first carpometacarpal joint. No acute fracture or subluxation. No radiopaque foreign body or soft tissue gas. IMPRESSION: No evidence for acute abnormality. Degenerative changes in the first carpometacarpal joint. Electronically Signed   By: Nolon Nations M.D.   On: 07/14/2018 13:54   Dg Hand Complete Right  Result Date: 07/14/2018 CLINICAL DATA:  C/o RT hand pain over metacarpals after fall x 1 day ago. EXAM: RIGHT HAND - COMPLETE 3+ VIEW COMPARISON:  None. FINDINGS: There are degenerative changes at the first carpometacarpal joint. Degenerative changes also in the distal interphalangeal joints, primarily second and third digits. There is no acute fracture or subluxation. No radiopaque foreign body or soft tissue gas. IMPRESSION: Degenerative changes.  No evidence for acute  abnormality. Electronically Signed   By: Nolon Nations M.D.   On: 07/14/2018 13:48    Procedures Procedures (including critical care time)  Medications Ordered in UC Medications - No data to display  Initial Impression / Assessment and  Plan / UC Course  I have reviewed the triage vital signs and the nursing notes.  Pertinent labs & imaging results that were available  during my care of the patient were reviewed by me and considered in my medical decision making (see chart for details).     Discussed imaging with pt.  Will treat hand and wrist pain as a sprain Wrist splint provided Home care instructions provided below and given pt info packet.   Final Clinical Impressions(s) / UC Diagnoses   Final diagnoses:  Right wrist sprain, initial encounter  Hand sprain, right, initial encounter  Fall from slip, trip, or stumble, initial encounter  Abrasion of left forearm, initial encounter  Contusion of left forearm, initial encounter     Discharge Instructions      You may remove the wrist splint to wash your hands and to apply a cool compress 2-3 times a day the next few days to help with swelling but you should wear the splint at all other times including while sleeping in case there is an occult (small hidden fracture).  If you still have pain and swelling in 1-2 weeks, please follow up with family medicine or sports medicine. They may repeat and x-ray to make sure there is no hidden fracture.   Keep wound on Left arm clean with warm water and mild soap. You may apply an over the counter antibiotic ointment for the next 3-5 days and cover with a bandage to help keep protected as it heals.     ED Prescriptions    None     Controlled Substance Prescriptions [place] Controlled Substance Registry consulted? Not Applicable   Tyrell Antonio 07/14/18 1504

## 2018-07-15 ENCOUNTER — Other Ambulatory Visit: Payer: Self-pay

## 2018-07-15 MED ORDER — FLUOXETINE HCL 20 MG PO CAPS: 20.0000 mg | ORAL_CAPSULE | Freq: Every day | ORAL | 1 refills | Status: DC

## 2018-07-15 NOTE — Telephone Encounter (Signed)
Patient would like to switch from tablets to capsules on the fluoxetine. There is about a $50 difference in price. Please advise.

## 2018-07-24 ENCOUNTER — Telehealth: Payer: Self-pay | Admitting: Emergency Medicine

## 2018-08-12 LAB — COMPLETE METABOLIC PANEL WITH GFR
AG RATIO: 2.4 (calc) (ref 1.0–2.5)
ALBUMIN MSPROF: 4.3 g/dL (ref 3.6–5.1)
ALT: 16 U/L (ref 6–29)
AST: 19 U/L (ref 10–35)
Alkaline phosphatase (APISO): 94 U/L (ref 33–130)
BILIRUBIN TOTAL: 0.5 mg/dL (ref 0.2–1.2)
BUN: 14 mg/dL (ref 7–25)
CALCIUM: 9.2 mg/dL (ref 8.6–10.4)
CHLORIDE: 106 mmol/L (ref 98–110)
CO2: 29 mmol/L (ref 20–32)
Creat: 0.8 mg/dL (ref 0.50–0.99)
GFR, EST AFRICAN AMERICAN: 91 mL/min/{1.73_m2} (ref >=60)
GFR, EST NON AFRICAN AMERICAN: 78 mL/min/{1.73_m2} (ref >=60)
Globulin: 1.8 g/dL (calc) — ABNORMAL LOW (ref 1.9–3.7)
Glucose, Bld: 87 mg/dL (ref 65–99)
POTASSIUM: 4.3 mmol/L (ref 3.5–5.3)
Sodium: 141 mmol/L (ref 135–146)
TOTAL PROTEIN: 6.1 g/dL (ref 6.1–8.1)

## 2018-08-12 LAB — LIPID PANEL
Cholesterol: 218 mg/dL — ABNORMAL HIGH (ref <200)
HDL: 47 mg/dL — ABNORMAL LOW (ref >50)
LDL CHOLESTEROL (CALC): 151 mg/dL — AB
NON-HDL CHOLESTEROL (CALC): 171 mg/dL — AB (ref <130)
TRIGLYCERIDES: 93 mg/dL (ref <150)
Total CHOL/HDL Ratio: 4.6 (calc) (ref <5.0)

## 2018-09-08 ENCOUNTER — Encounter: Payer: Self-pay | Admitting: Family Medicine

## 2018-09-08 ENCOUNTER — Ambulatory Visit (INDEPENDENT_AMBULATORY_CARE_PROVIDER_SITE_OTHER): Payer: BLUE CROSS/BLUE SHIELD | Admitting: Family Medicine

## 2018-09-08 VITALS — BP 119/60 | HR 76 | Ht 63.0 in | Wt 248.0 lb

## 2018-09-08 DIAGNOSIS — R300 Dysuria: Secondary | ICD-10-CM | POA: Diagnosis not present

## 2018-09-08 DIAGNOSIS — H9193 Unspecified hearing loss, bilateral: Secondary | ICD-10-CM

## 2018-09-08 DIAGNOSIS — R350 Frequency of micturition: Secondary | ICD-10-CM

## 2018-09-08 LAB — POCT URINALYSIS DIPSTICK
Bilirubin, UA: NEGATIVE
Glucose, UA: NEGATIVE
Ketones, UA: NEGATIVE
NITRITE UA: POSITIVE
PH UA: 7 (ref 5.0–8.0)
PROTEIN UA: NEGATIVE
SPEC GRAV UA: 1.015 (ref 1.010–1.025)
Urobilinogen, UA: 0.2 E.U./dL

## 2018-09-08 MED ORDER — SULFAMETHOXAZOLE-TRIMETHOPRIM 800-160 MG PO TABS: 1.0000 | ORAL_TABLET | Freq: Two times a day (BID) | ORAL | 0 refills | Status: DC

## 2018-09-08 MED ORDER — CETIRIZINE HCL 10 MG PO TABS: 10.0000 mg | ORAL_TABLET | Freq: Every day | ORAL | 0 refills | Status: DC

## 2018-09-08 MED ORDER — PREDNISONE 20 MG PO TABS: 40.0000 mg | ORAL_TABLET | Freq: Every day | ORAL | 0 refills | Status: DC

## 2018-09-08 NOTE — Patient Instructions (Signed)
Not noticing improvement in your hearing by the end of the week and please give [place] a call and we will refer you to ENT.  If we do refer you and you have not heard back within 3 business days then please let us know.

## 2018-09-08 NOTE — Progress Notes (Signed)
Subjective:    Patient ID: Sara Sanford, female    DOB: 1956/04/06, 62 y.o.   MRN: [id number]  HPI 62 year old female comes in today complaining of urinary symptoms that started last night.  Having some urgency and frequency and burning with urination.  No hematuria.  She has had some chills and she reports a low-grade temperature around 100.  She is had some mild back pain.  She also complains of worsening hearing loss and crackling noise in her ears.  This is been going on for a little over a week.  She is having a little discomfort radiating from her ears down to her neck.  She denies any trauma or upper respiratory symptoms.  She is feels like there is cotton stuff in her ears.  We actually had referred her to ENT about a year ago for ear ringing which is bilateral which she says is chronic.  Patient says she never heard back from that particular referral.   Review of Systems  BP 119/60   Pulse 76   Ht 5\' 3"  (1.6 m)   Wt 248 lb (112.5 kg)   SpO2 97%   BMI 43.93 kg/m     Allergies  Allergen Reactions  . Propoxyphene Other (See Comments) and Shortness Of Breath    Hallucinations Hallucinations    Past Medical History:  Diagnosis Date  . Heart burn    peptic ulcer  . Lichen sclerosus   . Obesity   . Post-menopausal     Past Surgical History:  Procedure Laterality Date  . CARDIAC CATHETERIZATION  2008  . DILATION AND CURETTAGE OF UTERUS    . HYSTEROSCOPY W/D&C  2008  . KIDNEY STONE SURGERY  1977    Social History   Socioeconomic History  . Marital status: Married    Spouse name: Not on file  . Number of children: 2  . Years of education: Not on file  . Highest education level: Not on file  Occupational History  . Occupation: Cytogeneticist: [employer]  Social Needs  . Financial resource strain: Not on file  . Food insecurity:    Worry: Not on file    Inability: Not on file  . Transportation needs:    Medical: Not on file    Non-medical: Not  on file  Tobacco Use  . Smoking status: Never Smoker  . Smokeless tobacco: Never Used  Substance and Sexual Activity  . Alcohol use: No  . Drug use: No  . Sexual activity: Yes    Partners: Male  Lifestyle  . Physical activity:    Days per week: Not on file    Minutes per session: Not on file  . Stress: Not on file  Relationships  . Social connections:    Talks on phone: Not on file    Gets together: Not on file    Attends religious service: Not on file    Active member of club or organization: Not on file    Attends meetings of clubs or organizations: Not on file    Relationship status: Not on file  . Intimate partner violence:    Fear of current or ex partner: Not on file    Emotionally abused: Not on file    Physically abused: Not on file    Forced sexual activity: Not on file  Other Topics Concern  . Not on file  Social History Narrative   2 mile walk 4 times a week.  Family History  Problem Relation Age of Onset  . Breast cancer Paternal Aunt        pm breast CA  . Breast cancer Paternal Aunt        PM breast CA  . Heart attack Father   . Hypertension Father   . Hypertension Mother     Outpatient Encounter Medications as of 09/08/2018  Medication Sig  . clobetasol cream (TEMOVATE) 0.05 % Use vaginally every other night as directed  Disp a 60gm tube Apply 2-3 times per week  . FLUoxetine (PROZAC) 20 MG capsule Take 1 capsule (20 mg total) by mouth daily.  Marland Kitchen omeprazole (PRILOSEC) 20 MG capsule Take one cap each morning 30 minutes before food.  Take one cap (before eating) each evening for one week.  . cetirizine (ZYRTEC) 10 MG tablet Take 1 tablet (10 mg total) by mouth daily.  . predniSONE (DELTASONE) 20 MG tablet Take 2 tablets (40 mg total) by mouth daily with breakfast.  . sulfamethoxazole-trimethoprim (BACTRIM DS,SEPTRA DS) 800-160 MG tablet Take 1 tablet by mouth 2 (two) times daily.   No facility-administered encounter medications on file as of  09/08/2018.        Objective:   Physical Exam  Constitutional: She is oriented to person, place, and time. She appears well-developed and well-nourished.  HENT:  Head: Normocephalic and atraumatic.  Right Ear: External ear normal.  Left Ear: External ear normal.  Nose: Nose normal.  Mouth/Throat: Oropharynx is clear and moist.  TMs and canals are clear.   Eyes: Pupils are equal, round, and reactive to light. Conjunctivae and EOM are normal.  Neck: Neck supple. No thyromegaly present.  Cardiovascular: Normal rate, regular rhythm and normal heart sounds.  Pulmonary/Chest: Effort normal and breath sounds normal. She has no wheezes.  Abdominal: Soft. Bowel sounds are normal. She exhibits no distension and no mass. There is no tenderness. There is no rebound and no guarding. No hernia.  Musculoskeletal:  No CVA tenderness  Lymphadenopathy:    She has no cervical adenopathy.  Neurological: She is alert and oriented to person, place, and time.  Skin: Skin is warm and dry.  Psychiatric: She has a normal mood and affect.        Assessment & Plan:  Urinary tract infection-urinalysis positive for nitrites and leukocytes.  We will go ahead and treat with Bactrim.  Make sure hydrating well and call if any recurrence of fever.  Hearing loss-unable to visualize her left TM and it is nice and clear.  No erythema no sign of fluid.  The right TM is partially obstructed by cerumen I am able to visualize the upper end of the TM which is clear.  We will treat with oral prednisone and antihistamine for 5 days it sounds like this could be eustachian tube dysfunction and may be even some pressure from allergies.  If not resolving after 1 week then will refer her to ENT.

## 2018-09-13 ENCOUNTER — Emergency Department (INDEPENDENT_AMBULATORY_CARE_PROVIDER_SITE_OTHER)
Admission: EM | Admit: 2018-09-13 | Discharge: 2018-09-13 | Disposition: A | Discharge status: Home / Self Care | Payer: BLUE CROSS/BLUE SHIELD | Source: Home / Self Care | Attending: Family Medicine | Admitting: Family Medicine

## 2018-09-13 ENCOUNTER — Other Ambulatory Visit: Payer: Self-pay

## 2018-09-13 DIAGNOSIS — N390 Urinary tract infection, site not specified: Secondary | ICD-10-CM | POA: Diagnosis not present

## 2018-09-13 DIAGNOSIS — R300 Dysuria: Secondary | ICD-10-CM

## 2018-09-13 LAB — POCT URINALYSIS DIP (MANUAL ENTRY)
BILIRUBIN UA: NEGATIVE
BILIRUBIN UA: NEGATIVE mg/dL
Glucose, UA: NEGATIVE mg/dL
Nitrite, UA: POSITIVE — AB
PH UA: 7.5 (ref 5.0–8.0)
PROTEIN UA: NEGATIVE mg/dL
SPEC GRAV UA: 1.015 (ref 1.010–1.025)
Urobilinogen, UA: 0.2 E.U./dL

## 2018-09-13 MED ORDER — CEPHALEXIN 500 MG PO CAPS: 500.0000 mg | ORAL_CAPSULE | Freq: Two times a day (BID) | ORAL | 0 refills | Status: DC

## 2018-09-13 NOTE — ED Triage Notes (Signed)
Here with re-occurent UTI. Was seen last week by PCP and prescribed Bactrim. Completed course. Sx's restarted- burning, freq, urge and low back pain. Afebrile today. Taking Azo OTC

## 2018-09-13 NOTE — Discharge Instructions (Signed)
°  Please take your antibiotic as prescribed. A urine culture has been sent to check the severity of your urinary infection and to determine if you are on the most appropriate antibiotic. The results should come back within 2-3 days and you will be notified even if no medication change is needed.  Please stay well hydrated and follow up with your family doctor in 1 week if not improving, sooner if worsening.  

## 2018-09-13 NOTE — ED Provider Notes (Signed)
[name]    CSN: [id number] Arrival date & time: 09/13/18  1418     History   Chief Complaint Chief Complaint  Patient presents with  . Urinary Tract Infection    HPI Sara Sanford is a 62 y.o. female.   HPI  Sara Sanford is a 62 y.o. female presenting to UC with hx of recurrent UTIs c/o sudden onset burning with urination, frequency, urgency and low back pain 2 days ago, worse last night, despite completing a course of Bactrim this past week, prescribed by her PCP for a UTI. Symptoms were improving but then suddenly worsened. They never completely resolved.  Denies fever, chills, n/v/d. She did take Azo last night with temporary relief. Last UTI was about 1 month ago.    Past Medical History:  Diagnosis Date  . Heart burn    peptic ulcer  . Lichen sclerosus   . Obesity   . Post-menopausal     Patient Active Problem List   Diagnosis Date Noted  . GAD (generalized anxiety disorder) 02/17/2017  . Abdominal aortic aneurysm ([place]) 08/01/2014  . POLYARTHRITIS 06/19/2010  . OBESITY 05/22/2009  . GERD 05/22/2009  . LEG EDEMA, BILATERAL 05/22/2009    Past Surgical History:  Procedure Laterality Date  . CARDIAC CATHETERIZATION  2008  . DILATION AND CURETTAGE OF UTERUS    . HYSTEROSCOPY W/D&C  2008  . KIDNEY STONE SURGERY  1977    OB History    Gravida  4   Para  3   Term      Preterm      AB  1   Living        SAB  1   TAB      Ectopic      Multiple      Live Births               Home Medications    Prior to Admission medications   Medication Sig Start Date End Date Taking? Authorizing Provider  cephALEXin (KEFLEX) 500 MG capsule Take 1 capsule (500 mg total) by mouth 2 (two) times daily. 09/13/18   Noe Gens, PA-C  cetirizine (ZYRTEC) 10 MG tablet Take 1 tablet (10 mg total) by mouth daily. 09/08/18   Hali Marry, MD  clobetasol cream (TEMOVATE) 0.05 % Use vaginally every other night as directed  Disp  a 60gm tube Apply 2-3 times per week 01/06/17   Emily Filbert, MD  FLUoxetine (PROZAC) 20 MG capsule Take 1 capsule (20 mg total) by mouth daily. 07/15/18   Hali Marry, MD  omeprazole (PRILOSEC) 20 MG capsule Take one cap each morning 30 minutes before food.  Take one cap (before eating) each evening for one week. 04/10/15   Kandra Nicolas, MD  predniSONE (DELTASONE) 20 MG tablet Take 2 tablets (40 mg total) by mouth daily with breakfast. 09/08/18   Hali Marry, MD  sulfamethoxazole-trimethoprim (BACTRIM DS,SEPTRA DS) 800-160 MG tablet Take 1 tablet by mouth 2 (two) times daily. 09/08/18   Hali Marry, MD    Family History Family History  Problem Relation Age of Onset  . Breast cancer Paternal Aunt        pm breast CA  . Breast cancer Paternal Aunt        PM breast CA  . Heart attack Father   . Hypertension Father   . Hypertension Mother     Social History Social History   Tobacco Use  .  Smoking status: Never Smoker  . Smokeless tobacco: Never Used  Substance Use Topics  . Alcohol use: No  . Drug use: No     Allergies   Propoxyphene   Review of Systems Review of Systems  Constitutional: Negative for chills and fever.  Gastrointestinal: Negative for abdominal pain, nausea and vomiting.  Genitourinary: Positive for dysuria, frequency and urgency. Negative for hematuria.  Musculoskeletal: Positive for back pain. Negative for myalgias.  Neurological: Negative for dizziness, light-headedness and headaches.     Physical Exam Triage Vital Signs ED Triage Vitals  Enc Vitals Group     BP 09/13/18 1439 123/78     Pulse Rate 09/13/18 1439 78     Resp --      Temp 09/13/18 1439 97.6 F (36.4 C)     Temp Source 09/13/18 1439 Oral     SpO2 09/13/18 1439 98 %     Weight 09/13/18 1440 248 lb 12.8 oz (112.9 kg)     Height --      Head Circumference --      Peak Flow --      Pain Score 09/13/18 1440 8     Pain Loc --      Pain Edu? --      Excl.  in [place]? --    No data found.  Updated Vital Signs BP 123/78 (BP Location: Left Arm)   Pulse 78   Temp 97.6 F (36.4 C) (Oral)   Wt 248 lb 12.8 oz (112.9 kg)   SpO2 98%   BMI 44.07 kg/m   Visual Acuity Right Eye Distance:   Left Eye Distance:   Bilateral Distance:    Right Eye Near:   Left Eye Near:    Bilateral Near:     Physical Exam  Constitutional: She is oriented to person, place, and time. She appears well-developed and well-nourished.  HENT:  Head: Normocephalic and atraumatic.  Mouth/Throat: Oropharynx is clear and moist.  Eyes: EOM are normal.  Neck: Normal range of motion.  Cardiovascular: Normal rate and regular rhythm.  Pulmonary/Chest: Effort normal and breath sounds normal. No respiratory distress.  Abdominal: Soft. She exhibits no distension. There is no tenderness. There is no CVA tenderness.  Musculoskeletal: Normal range of motion.  Neurological: She is alert and oriented to person, place, and time.  Skin: Skin is warm and dry.  Psychiatric: She has a normal mood and affect. Her behavior is normal.  Nursing note and vitals reviewed.    UC Treatments / Results  Labs (all labs ordered are listed, but only abnormal results are displayed) Labs Reviewed  POCT URINALYSIS DIP (MANUAL ENTRY) - Abnormal; Notable for the following components:      Result Value   Color, UA orange (*)    Blood, UA small (*)    Nitrite, UA Positive (*)    Leukocytes, UA Large (3+) (*)    All other components within normal limits    EKG None  Radiology No results found.  Procedures Procedures (including critical care time)  Medications Ordered in UC Medications - No data to display  Initial Impression / Assessment and Plan / UC Course  I have reviewed the triage vital signs and the nursing notes.  Pertinent labs & imaging results that were available during my care of the patient were reviewed by me and considered in my medical decision making (see chart for  details).     UA  And symptoms c/w UTI Culture sent Will start on  keflex Pt has done well with keflex in the past.  Final Clinical Impressions(s) / UC Diagnoses   Final diagnoses:  Recurrent UTI     Discharge Instructions      Please take your antibiotic as prescribed. A urine culture has been sent to check the severity of your urinary infection and to determine if you are on the most appropriate antibiotic. The results should come back within 2-3 days and you will be notified even if no medication change is needed.  Please stay well hydrated and follow up with your family doctor in 1 week if not improving, sooner if worsening.      ED Prescriptions    Medication Sig Dispense Auth. Provider   cephALEXin (KEFLEX) 500 MG capsule Take 1 capsule (500 mg total) by mouth 2 (two) times daily. 14 capsule Noe Gens, PA-C     Controlled Substance Prescriptions [place] Controlled Substance Registry consulted? Not Applicable   Tyrell Antonio 09/13/18 1548

## 2018-09-15 ENCOUNTER — Telehealth: Payer: Self-pay | Admitting: Emergency Medicine

## 2018-09-15 LAB — URINE CULTURE
MICRO NUMBER:: 91201234
SPECIMEN QUALITY:: ADEQUATE

## 2018-10-28 ENCOUNTER — Encounter: Payer: Self-pay | Admitting: Family Medicine

## 2018-10-28 ENCOUNTER — Ambulatory Visit (INDEPENDENT_AMBULATORY_CARE_PROVIDER_SITE_OTHER): Payer: BLUE CROSS/BLUE SHIELD | Admitting: Family Medicine

## 2018-10-28 VITALS — BP 122/45 | HR 99 | Ht 61.81 in | Wt 247.0 lb

## 2018-10-28 DIAGNOSIS — R300 Dysuria: Secondary | ICD-10-CM | POA: Diagnosis not present

## 2018-10-28 DIAGNOSIS — H9193 Unspecified hearing loss, bilateral: Secondary | ICD-10-CM

## 2018-10-28 LAB — POCT URINALYSIS DIPSTICK
Bilirubin, UA: NEGATIVE
Glucose, UA: NEGATIVE
Ketones, UA: NEGATIVE
NITRITE UA: NEGATIVE
Protein, UA: NEGATIVE
Urobilinogen, UA: 0.2 E.U./dL
pH, UA: 5.5 (ref 5.0–8.0)

## 2018-10-28 MED ORDER — NITROFURANTOIN MONOHYD MACRO 100 MG PO CAPS: 100.0000 mg | ORAL_CAPSULE | Freq: Two times a day (BID) | ORAL | 0 refills | Status: AC

## 2018-10-28 MED ORDER — ESTRADIOL 0.1 MG/GM VA CREA: 1.0000 | TOPICAL_CREAM | Freq: Every day | VAGINAL | 12 refills | Status: DC

## 2018-10-28 NOTE — Progress Notes (Signed)
Subjective:    Patient ID: Sara Sanford, female    DOB: 06-Nov-1956, 62 y.o.   MRN: [id number]  HPI  UTI - sxs x 2 days, burning,frequency,urgency, nausea, low back pain, denies fever. she wanted to discuss why she keeps getting these infections.  Unfortunately per our records this is her fourth UTI this year on average about 3 to 4 months apart that her last one was only about 5 weeks ago.  She says in the past when she is been treated with Bactrim but never seems to really be quite effective.  This last round she had been given a second course of antibiotics which was Keflex.  She has not had any fever, chills or sweats.  She also had previously complained of bilateral hearing loss and we had discussed possibly referring her to ENT for full audiologic evaluation and work-up.  She says at this time she thinks she is interested and would like to go ahead and have [place] place the referral.  Review of Systems  BP (!) 122/45   Pulse 99   Ht 5' 1.81" (1.57 m)   Wt 247 lb (112 kg)   SpO2 99%   BMI 45.45 kg/m     Allergies  Allergen Reactions  . Propoxyphene Other (See Comments) and Shortness Of Breath    Hallucinations Hallucinations    Past Medical History:  Diagnosis Date  . Heart burn    peptic ulcer  . Lichen sclerosus   . Obesity   . Post-menopausal     Past Surgical History:  Procedure Laterality Date  . CARDIAC CATHETERIZATION  2008  . DILATION AND CURETTAGE OF UTERUS    . HYSTEROSCOPY W/D&C  2008  . KIDNEY STONE SURGERY  1977    Social History   Socioeconomic History  . Marital status: Married    Spouse name: Not on file  . Number of children: 2  . Years of education: Not on file  . Highest education level: Not on file  Occupational History  . Occupation: Cytogeneticist: [employer]  Social Needs  . Financial resource strain: Not on file  . Food insecurity:    Worry: Not on file    Inability: Not on file  . Transportation needs:    Medical: Not  on file    Non-medical: Not on file  Tobacco Use  . Smoking status: Never Smoker  . Smokeless tobacco: Never Used  Substance and Sexual Activity  . Alcohol use: No  . Drug use: No  . Sexual activity: Yes    Partners: Male  Lifestyle  . Physical activity:    Days per week: Not on file    Minutes per session: Not on file  . Stress: Not on file  Relationships  . Social connections:    Talks on phone: Not on file    Gets together: Not on file    Attends religious service: Not on file    Active member of club or organization: Not on file    Attends meetings of clubs or organizations: Not on file    Relationship status: Not on file  . Intimate partner violence:    Fear of current or ex partner: Not on file    Emotionally abused: Not on file    Physically abused: Not on file    Forced sexual activity: Not on file  Other Topics Concern  . Not on file  Social History Narrative   2 mile walk 4 times  a week.      Family History  Problem Relation Age of Onset  . Breast cancer Paternal Aunt        pm breast CA  . Breast cancer Paternal Aunt        PM breast CA  . Heart attack Father   . Hypertension Father   . Hypertension Mother     Outpatient Encounter Medications as of 10/28/2018  Medication Sig  . cetirizine (ZYRTEC) 10 MG tablet Take 1 tablet (10 mg total) by mouth daily.  . clobetasol cream (TEMOVATE) 0.05 % Use vaginally every other night as directed  Disp a 60gm tube Apply 2-3 times per week  . FLUoxetine (PROZAC) 20 MG capsule Take 1 capsule (20 mg total) by mouth daily.  Marland Kitchen omeprazole (PRILOSEC) 20 MG capsule Take one cap each morning 30 minutes before food.  Take one cap (before eating) each evening for one week.  . estradiol (ESTRACE) 0.1 MG/GM vaginal cream Place 1 Applicatorful vaginally at bedtime. X 2 weeks, then 3 x per week.  . nitrofurantoin, macrocrystal-monohydrate, (MACROBID) 100 MG capsule Take 1 capsule (100 mg total) by mouth 2 (two) times daily for 14  days.  . [DISCONTINUED] cephALEXin (KEFLEX) 500 MG capsule Take 1 capsule (500 mg total) by mouth 2 (two) times daily.  . [DISCONTINUED] predniSONE (DELTASONE) 20 MG tablet Take 2 tablets (40 mg total) by mouth daily with breakfast.  . [DISCONTINUED] sulfamethoxazole-trimethoprim (BACTRIM DS,SEPTRA DS) 800-160 MG tablet Take 1 tablet by mouth 2 (two) times daily.   No facility-administered encounter medications on file as of 10/28/2018.          Objective:   Physical Exam  Constitutional: She is oriented to person, place, and time. She appears well-developed and well-nourished.  HENT:  Head: Normocephalic and atraumatic.  Cardiovascular: Normal rate, regular rhythm and normal heart sounds.  Pulmonary/Chest: Effort normal and breath sounds normal.  Neurological: She is alert and oriented to person, place, and time.  Skin: Skin is warm and dry.  Psychiatric: She has a normal mood and affect. Her behavior is normal.        Assessment & Plan:  UTI -   We discussed options.  I am to go ahead and place her on nitrofurantoin which she says she believes she is actually never really tried before to clear up the current UTI.  And sent for culture as the last one did grow E. coli and did have some resistance pattern to it.  Recurrent UTIs - . In regards to the recurrent redness of her UTIs we discussed daily prophylaxis with low-dose antibiotics versus topical estrogen cream.  She says she like to start with a topical estrogen cream.  And then we can monitor over the next 6 to 12 months to see how she is doing.  She will apply the cream over the urethral and external vaginal area daily for 2 weeks and then go down to 3 days/week such as Monday Wednesday and Friday for maintenance.  Hopefully this will make a big difference.  She says she has had frequent UTIs most of her adult life even before menopause.  Bilateral hearing loss-we will go ahead and place referral to ENT.

## 2018-10-30 LAB — URINE CULTURE
MICRO NUMBER: 91400633
SPECIMEN QUALITY:: ADEQUATE

## 2018-12-30 ENCOUNTER — Other Ambulatory Visit: Payer: Self-pay | Admitting: Family Medicine

## 2019-01-12 ENCOUNTER — Ambulatory Visit (INDEPENDENT_AMBULATORY_CARE_PROVIDER_SITE_OTHER): Payer: BLUE CROSS/BLUE SHIELD | Admitting: Family Medicine

## 2019-01-12 ENCOUNTER — Encounter: Payer: Self-pay | Admitting: Family Medicine

## 2019-01-12 VITALS — BP 104/53 | HR 68 | Ht 62.0 in | Wt 250.0 lb

## 2019-01-12 DIAGNOSIS — H9193 Unspecified hearing loss, bilateral: Secondary | ICD-10-CM | POA: Diagnosis not present

## 2019-01-12 DIAGNOSIS — F411 Generalized anxiety disorder: Secondary | ICD-10-CM

## 2019-01-12 DIAGNOSIS — F4321 Adjustment disorder with depressed mood: Secondary | ICD-10-CM | POA: Diagnosis not present

## 2019-01-12 DIAGNOSIS — N390 Urinary tract infection, site not specified: Secondary | ICD-10-CM

## 2019-01-12 NOTE — Progress Notes (Signed)
   Subjective:    Patient ID: Sara Sanford, female    DOB: 01-23-1956, 63 y.o.   MRN: [id number]  HPI 63 year old female is here today to follow-up for anxiety.  She is currently on fluoxetine.  When I saw her 6 months ago we actually went down on her dose to 20 mg.  She reports feeling down several days of the week.  No thoughts of harming herself.  She also reports feeling nervous and anxious several days of the week and feeling occasionally restless.  She is happy with her current regimen and does not want to go back up on her medication though she admits she has been under a little bit more stress recently as her mother passed away the day after Christmas so she has been grieving.  She did follow-up for her hearing loss and was evaluated at [facility] ear nose and throat at the end of November.  They recommended hearing amplification and avoiding loud noise exposure especially for her tinnitus.  Right now she is not interested in getting hearing aids.  Recurrent UTIs-she is continuing to use estradiol cream and that seems to be helpful.  She has not had any recent urinary tract infections.  Hyperlipidemia-she did do blood work in September revealing an LDL of 151 with normal triglycerides.  She recently started some fish oil.   Review of Systems     Objective:   Physical Exam Constitutional:      Appearance: She is well-developed.  HENT:     Head: Normocephalic and atraumatic.  Cardiovascular:     Rate and Rhythm: Normal rate and regular rhythm.     Heart sounds: Normal heart sounds.  Pulmonary:     Effort: Pulmonary effort is normal.     Breath sounds: Normal breath sounds.  Skin:    General: Skin is warm and dry.  Neurological:     Mental Status: She is alert and oriented to person, place, and time.  Psychiatric:        Behavior: Behavior normal.       Assessment & Plan:  GAD-we will.  Continue current regimen with fluoxetine 20 mg if at any point she feels like we  need to increase her dose just reach out.  PHQ 9 score of 3 today and gad 7 score of 3.  Hyperlipidemia-reviewed her recent lipids with her encouraged healthy diet and regular exercise and weight loss.  Explained that fish oil typically helps triglycerides but it really does not help lower her LDL.  Grief-overall she is doing well.  Encouraged her to reach out if she feels at any point she is struggling.  UTI-continue with Estrace vaginal cream.

## 2019-03-10 ENCOUNTER — Other Ambulatory Visit: Payer: Self-pay | Admitting: Family Medicine

## 2019-03-10 ENCOUNTER — Other Ambulatory Visit: Payer: Self-pay | Admitting: Obstetrics & Gynecology

## 2019-03-10 DIAGNOSIS — L900 Lichen sclerosus et atrophicus: Secondary | ICD-10-CM

## 2019-04-10 ENCOUNTER — Emergency Department (INDEPENDENT_AMBULATORY_CARE_PROVIDER_SITE_OTHER)
Admission: EM | Admit: 2019-04-10 | Discharge: 2019-04-10 | Disposition: A | Discharge status: Home / Self Care | Payer: BLUE CROSS/BLUE SHIELD | Source: Home / Self Care

## 2019-04-10 ENCOUNTER — Other Ambulatory Visit: Payer: Self-pay

## 2019-04-10 DIAGNOSIS — R350 Frequency of micturition: Secondary | ICD-10-CM

## 2019-04-10 DIAGNOSIS — Z87440 Personal history of urinary (tract) infections: Secondary | ICD-10-CM

## 2019-04-10 DIAGNOSIS — R300 Dysuria: Secondary | ICD-10-CM

## 2019-04-10 MED ORDER — NITROFURANTOIN MONOHYD MACRO 100 MG PO CAPS: 100.0000 mg | ORAL_CAPSULE | Freq: Two times a day (BID) | ORAL | 0 refills | Status: AC

## 2019-04-10 NOTE — Discharge Instructions (Signed)
°  Be sure to stay well hydrated.  Your urine has been sent for further testing to grow out the type of bacteria. You have been started on an antibiotic that covers the most common bacteria.  In some occasions the antibiotic will need to be changed.  If this occurs, or if the culture does not grow out any bacteria, you will be notified.   Please follow up with your family doctor next week as needed.

## 2019-04-10 NOTE — ED Provider Notes (Signed)
[name]    CSN: [id number] Arrival date & time: 04/10/19  1136     History   Chief Complaint Chief Complaint  Patient presents with  . Dysuria    HPI Sara Sanford is a 63 y.o. female.   HPI  Sara Sanford is a 63 y.o. female presenting to UC with c/o dysuria and urinary frequency that started yesterday.  She took Azo around 4AM to help her get back to sleep.  She did not notice any hematuria prior to taking the Azo.  Associated mild lower abdominal pain, back pain and chills. Hx of recurrent UTIs. Last UTI was in January, which pt states is good for her.  She was on Macrobid the last time for 14 days and did well. She notes she has not had success with Cipro or Bactrim in the past.  She was advised by her PCP she may need to start taking a daily antibiotic if her UTIs keep recurring frequently.  Denies fever, n/v/d. Denies vaginal symptoms.   Past Medical History:  Diagnosis Date  . Heart burn    peptic ulcer  . Lichen sclerosus   . Obesity   . Post-menopausal     Patient Active Problem List   Diagnosis Date Noted  . GAD (generalized anxiety disorder) 02/17/2017  . Abdominal aortic aneurysm ([place]) 08/01/2014  . POLYARTHRITIS 06/19/2010  . OBESITY 05/22/2009  . GERD 05/22/2009  . LEG EDEMA, BILATERAL 05/22/2009    Past Surgical History:  Procedure Laterality Date  . CARDIAC CATHETERIZATION  2008  . DILATION AND CURETTAGE OF UTERUS    . HYSTEROSCOPY W/D&C  2008  . KIDNEY STONE SURGERY  1977    OB History    Gravida  4   Para  3   Term      Preterm      AB  1   Living        SAB  1   TAB      Ectopic      Multiple      Live Births               Home Medications    Prior to Admission medications   Medication Sig Start Date End Date Taking? Authorizing Provider  cetirizine (ZYRTEC) 10 MG tablet Take 1 tablet (10 mg total) by mouth daily. 09/08/18   Hali Marry, MD  clobetasol cream (TEMOVATE) 0.05 %  APPLY  CREAM TWICE TO THREE TIMES A WEEK AT  NIGHT  AS  DIRECTED 03/11/19   Emily Filbert, MD  estradiol (ESTRACE) 0.1 MG/GM vaginal cream Place 1 Applicatorful vaginally at bedtime. X 2 weeks, then 3 x per week. 10/28/18   Hali Marry, MD  FLUoxetine (PROZAC) 20 MG capsule Take 1 capsule by mouth once daily 03/10/19   Hali Marry, MD  nitrofurantoin, macrocrystal-monohydrate, (MACROBID) 100 MG capsule Take 1 capsule (100 mg total) by mouth 2 (two) times daily for 14 days. 04/10/19 04/24/19  Noe Gens, PA-C  omeprazole (PRILOSEC) 20 MG capsule Take one cap each morning 30 minutes before food.  Take one cap (before eating) each evening for one week. 04/10/15   Kandra Nicolas, MD    Family History Family History  Problem Relation Age of Onset  . Breast cancer Paternal Aunt        pm breast CA  . Breast cancer Paternal Aunt        PM breast CA  . Heart attack  Father   . Hypertension Father   . Hypertension Mother     Social History Social History   Tobacco Use  . Smoking status: Never Smoker  . Smokeless tobacco: Never Used  Substance Use Topics  . Alcohol use: No  . Drug use: No     Allergies   Propoxyphene   Review of Systems Review of Systems  Constitutional: Positive for chills. Negative for fever.  Gastrointestinal: Positive for abdominal pain. Negative for diarrhea, nausea and vomiting.  Genitourinary: Positive for dysuria, frequency and urgency. Negative for flank pain, hematuria, pelvic pain and vaginal pain.  Musculoskeletal: Positive for back pain.  Neurological: Negative for dizziness and headaches.     Physical Exam Triage Vital Signs ED Triage Vitals  Enc Vitals Group     BP 04/10/19 1200 110/76     Pulse Rate 04/10/19 1200 75     Resp 04/10/19 1200 20     Temp 04/10/19 1200 98.4 F (36.9 C)     Temp Source 04/10/19 1200 Oral     SpO2 04/10/19 1200 97 %     Weight 04/10/19 1201 235 lb (106.6 kg)     Height 04/10/19 1201 5\' 2"  (1.575  m)     Head Circumference --      Peak Flow --      Pain Score 04/10/19 1200 8     Pain Loc --      Pain Edu? --      Excl. in [place]? --    No data found.  Updated Vital Signs BP 110/76 (BP Location: Right Arm)   Pulse 75   Temp 98.4 F (36.9 C) (Oral)   Resp 20   Ht 5\' 2"  (1.575 m)   Wt 235 lb (106.6 kg)   SpO2 97%   BMI 42.98 kg/m   Visual Acuity Right Eye Distance:   Left Eye Distance:   Bilateral Distance:    Right Eye Near:   Left Eye Near:    Bilateral Near:     Physical Exam Vitals signs and nursing note reviewed.  Constitutional:      Appearance: Normal appearance. She is well-developed.  HENT:     Head: Normocephalic and atraumatic.     Mouth/Throat:     Mouth: Mucous membranes are moist.  Neck:     Musculoskeletal: Normal range of motion.  Cardiovascular:     Rate and Rhythm: Normal rate and regular rhythm.  Pulmonary:     Effort: Pulmonary effort is normal.     Breath sounds: Normal breath sounds.  Abdominal:     Palpations: Abdomen is soft.     Tenderness: There is no abdominal tenderness. There is no right CVA tenderness or left CVA tenderness.  Musculoskeletal: Normal range of motion.  Skin:    General: Skin is warm and dry.  Neurological:     Mental Status: She is alert and oriented to person, place, and time.  Psychiatric:        Behavior: Behavior normal.      UC Treatments / Results  Labs (all labs ordered are listed, but only abnormal results are displayed) Labs Reviewed  URINE CULTURE    EKG None  Radiology No results found.  Procedures Procedures (including critical care time)  Medications Ordered in UC Medications - No data to display  Initial Impression / Assessment and Plan / UC Course  I have reviewed the triage vital signs and the nursing notes.  Pertinent labs & imaging results that  were available during my care of the patient were reviewed by me and considered in my medical decision making (see chart for  details).     Unable to run POCT for UA due to pt taking Azo earlier this morning. Will tx empirically based on PMH Urine culture sent Encouraged f/u with PCP later this week if not improving.   Final Clinical Impressions(s) / UC Diagnoses   Final diagnoses:  Dysuria  History of recurrent UTIs  Urinary frequency     Discharge Instructions      Be sure to stay well hydrated.  Your urine has been sent for further testing to grow out the type of bacteria. You have been started on an antibiotic that covers the most common bacteria.  In some occasions the antibiotic will need to be changed.  If this occurs, or if the culture does not grow out any bacteria, you will be notified.   Please follow up with your family doctor next week as needed.    ED Prescriptions    Medication Sig Dispense Auth. Provider   nitrofurantoin, macrocrystal-monohydrate, (MACROBID) 100 MG capsule Take 1 capsule (100 mg total) by mouth 2 (two) times daily for 14 days. 28 capsule Noe Gens, PA-C     Controlled Substance Prescriptions [place] Controlled Substance Registry consulted? Not Applicable   Tyrell Antonio 04/10/19 1258

## 2019-04-10 NOTE — ED Triage Notes (Signed)
Started last night with frequency and dysuria.  Took AZO this am

## 2019-04-12 ENCOUNTER — Telehealth: Payer: Self-pay

## 2019-04-12 LAB — URINE CULTURE
MICRO NUMBER:: 441549
SPECIMEN QUALITY:: ADEQUATE

## 2019-04-12 NOTE — Telephone Encounter (Signed)
Spoke with patient, still having sx but getting better.  Given UCX results.  Will follow up as needed.

## 2019-04-13 MED ORDER — CEPHALEXIN 500 MG PO CAPS: 500.0000 mg | ORAL_CAPSULE | Freq: Two times a day (BID) | ORAL | 0 refills | Status: AC

## 2019-04-13 NOTE — Telephone Encounter (Signed)
Macrobid was causing nausea.  Dr Assunta Found changed script to Keflex.  Sent to pharmacy.

## 2019-05-25 ENCOUNTER — Other Ambulatory Visit: Payer: Self-pay | Admitting: Family Medicine

## 2019-05-25 DIAGNOSIS — Z1231 Encounter for screening mammogram for malignant neoplasm of breast: Secondary | ICD-10-CM

## 2019-05-31 ENCOUNTER — Other Ambulatory Visit: Payer: Self-pay

## 2019-05-31 ENCOUNTER — Other Ambulatory Visit: Payer: Self-pay | Admitting: Family Medicine

## 2019-05-31 ENCOUNTER — Telehealth: Payer: Self-pay | Admitting: Family Medicine

## 2019-05-31 ENCOUNTER — Encounter: Payer: Self-pay | Admitting: Family Medicine

## 2019-05-31 ENCOUNTER — Ambulatory Visit (INDEPENDENT_AMBULATORY_CARE_PROVIDER_SITE_OTHER): Payer: BC Managed Care – PPO

## 2019-05-31 ENCOUNTER — Ambulatory Visit (INDEPENDENT_AMBULATORY_CARE_PROVIDER_SITE_OTHER): Payer: BC Managed Care – PPO | Admitting: Family Medicine

## 2019-05-31 VITALS — BP 107/50 | HR 69 | Temp 98.3°F | Wt 252.0 lb

## 2019-05-31 DIAGNOSIS — M25562 Pain in left knee: Secondary | ICD-10-CM

## 2019-05-31 MED ORDER — DICLOFENAC SODIUM 1 % TD GEL: 4.0000 g | Freq: Four times a day (QID) | TRANSDERMAL | 11 refills | Status: DC

## 2019-05-31 NOTE — Patient Instructions (Signed)
Thank you for coming in today. Call or go to the ER if you develop a large red swollen joint with extreme pain or oozing puss.  Do the straight leg raises. Ok to add the other exercises.   Use diclofenac gel up to 4 x daily for pain.  Keep me updated.  If not improved we can do more evaluation.

## 2019-05-31 NOTE — Progress Notes (Signed)
Sara Sanford is a 63 y.o. female who presents to [facility] today for left knee pain.   Left knee pain present for a few months. Worse last week. Now very severe. Feels like on fire.  She notes that 6 months ago going down some stairs she felt a crunching sensation in her knees.  That hurt for a few days then resolved.  Her pain only returned a few months ago.  She notes the pain is quite severe now.  She is having trouble with standing from a seated position and mobility due to pain.  She feels as though her knee is swollen and feels tight.  She has tried some over-the-counter medications for pain which is helped a little.  Her pain can be severe at times and is somewhat disabling and she is having trouble getting around at times.  ROS:  As above  Exam:  BP (!) 107/50   Pulse 69   Temp 98.3 F (36.8 C) (Oral)   Wt 252 lb (114.3 kg)   BMI 46.09 kg/m  Wt Readings from Last 5 Encounters:  05/31/19 252 lb (114.3 kg)  04/10/19 235 lb (106.6 kg)  01/12/19 250 lb (113.4 kg)  10/28/18 247 lb (112 kg)  09/13/18 248 lb 12.8 oz (112.9 kg)   General: Well Developed, well nourished, and in no acute distress.  Neuro/Psych: Alert and oriented x3, extra-ocular muscles intact, able to move all 4 extremities, sensation grossly intact. Skin: Warm and dry, no rashes noted.  Respiratory: Not using accessory muscles, speaking in full sentences, trachea midline.  Cardiovascular: Pulses palpable, no extremity edema. Abdomen: Does not appear distended. MSK: Left knee: Moderate effusion no deformities. Range of motion 5-100 degrees with crepitations. Tender palpation medial joint line. Stable ligamentous exam however exam limited by guarding. Unable to perform McMurray's test due to guarding. Intact flexion and extension strength.  Contralateral right knee no effusion. Full range of motion with crepitation. Nontender. Stable ligamentous exam.     Lab and Radiology Results Procedure: Real-time Ultrasound Guided Injection of left knee Device: GE Logiq E   Images permanently stored and available for review in the ultrasound unit. Verbal informed consent obtained.  Discussed risks and benefits of procedure. Warned about infection bleeding damage to structures skin hypopigmentation and fat atrophy among others. Patient expresses understanding and agreement Time-out conducted.   Noted no overlying erythema, induration, or other signs of local infection.   Skin prepped in a sterile fashion.   Local anesthesia: Topical Ethyl chloride.   With sterile technique and under real time ultrasound guidance:  4 mL of Marcaine and 80 mg of Kenalog injected easily.   Completed without difficulty   Pain immediately resolved suggesting accurate placement of the medication.   Advised to call if fevers/chills, erythema, induration, drainage, or persistent bleeding.   Images permanently stored and available for review in the ultrasound unit.  Impression: Technically successful ultrasound guided injection.   X-ray left knee images personally independently reviewed. Severe medial compartment degenerative changes.  Mild Tello femoral degenerative changes.  No fractures visible. Await formal radiology over read.      Assessment and Plan: 63 y.o. female with knee pain.  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  Pain worsening over the last few months.  Patient may have a meniscus injury or focal DJD.  No fractures visible on x-ray today.  Plan for injection diclofenac gel eccentric exercises and weight loss.  Recheck if not improving.  PDMP not reviewed this encounter. Orders Placed This Encounter  Procedures  . DG Knee 1-2 Views Right    Standing Status:   Future    Number of Occurrences:   1    Standing Expiration Date:   07/31/2020    Order Specific Question:   Reason for Exam (SYMPTOM  OR DIAGNOSIS REQUIRED)    Answer:   For use with  the left knee x-ray bilateral AP and Rosenberg standing.    Order Specific Question:   Preferred imaging location?    Answer:   [facility]   Meds ordered this encounter  Medications  . diclofenac sodium (VOLTAREN) 1 % GEL    Sig: Apply 4 g topically 4 (four) times daily. To affected joint.    Dispense:  100 g    Refill:  11    Historical information moved to improve visibility of documentation.  Past Medical History:  Diagnosis Date  . Heart burn    peptic ulcer  . Lichen sclerosus   . Obesity   . Post-menopausal    Past Surgical History:  Procedure Laterality Date  . CARDIAC CATHETERIZATION  2008  . DILATION AND CURETTAGE OF UTERUS    . HYSTEROSCOPY W/D&C  2008  . KIDNEY STONE SURGERY  1977   Social History   Tobacco Use  . Smoking status: Never Smoker  . Smokeless tobacco: Never Used  Substance Use Topics  . Alcohol use: No   family history includes Breast cancer in her paternal aunt and paternal aunt; Heart attack in her father; Hypertension in her father and mother.  Medications: Current Outpatient Medications  Medication Sig Dispense Refill  . cetirizine (ZYRTEC) 10 MG tablet Take 1 tablet (10 mg total) by mouth daily. 30 tablet 0  . clobetasol cream (TEMOVATE) 0.05 % APPLY  CREAM TWICE TO THREE TIMES A WEEK AT  NIGHT  AS  DIRECTED 60 g 0  . diclofenac sodium (VOLTAREN) 1 % GEL Apply 4 g topically 4 (four) times daily. To affected joint. 100 g 11  . estradiol (ESTRACE) 0.1 MG/GM vaginal cream Place 1 Applicatorful vaginally at bedtime. X 2 weeks, then 3 x per week. 42.5 g 12  . FLUoxetine (PROZAC) 20 MG capsule Take 1 capsule by mouth once daily 90 capsule 1  . omeprazole (PRILOSEC) 20 MG capsule Take one cap each morning 30 minutes before food.  Take one cap (before eating) each evening for one week. 37 capsule 1   No current facility-administered medications for this visit.    Allergies  Allergen Reactions  . Propoxyphene Other (See Comments) and  Shortness Of Breath    Hallucinations Hallucinations      Discussed warning signs or symptoms. Please see discharge instructions. Patient expresses understanding.

## 2019-05-31 NOTE — Telephone Encounter (Signed)
Pt was unable to pick up rx for her knee. Called pharmacy and they stated she needed prior authorization. Will forward to [place].Marland Kitchen

## 2019-06-01 NOTE — Telephone Encounter (Signed)
We will work on prior authorization.  However in the meantime patient can use good Rx coupon to get the medication much cheaper.  Prior authorization sometimes can take up to 2 weeks for the insurance to respond to our request so this can sometimes take a little while.  However you should be able to get the medication tomorrow with a good Rx coupon and about $20 at [facility].

## 2019-06-01 NOTE — Telephone Encounter (Signed)
I called the pharmacy and per Santiago Glad she could apply the Good Rx coupon for the patient that would make the Diclofenac Gel cheaper.   I then called the patient and she was able to get the prescription for cheaper and will call the pharmacy to get this filled. She did not have any questions.

## 2019-06-17 ENCOUNTER — Ambulatory Visit (INDEPENDENT_AMBULATORY_CARE_PROVIDER_SITE_OTHER): Payer: BC Managed Care – PPO

## 2019-06-17 ENCOUNTER — Other Ambulatory Visit: Payer: Self-pay

## 2019-06-17 DIAGNOSIS — Z1231 Encounter for screening mammogram for malignant neoplasm of breast: Secondary | ICD-10-CM

## 2019-06-30 ENCOUNTER — Ambulatory Visit (INDEPENDENT_AMBULATORY_CARE_PROVIDER_SITE_OTHER): Payer: BC Managed Care – PPO | Admitting: Family Medicine

## 2019-06-30 ENCOUNTER — Encounter: Payer: Self-pay | Admitting: Family Medicine

## 2019-06-30 ENCOUNTER — Other Ambulatory Visit: Payer: Self-pay

## 2019-06-30 VITALS — BP 106/27 | HR 79 | Ht 62.0 in | Wt 249.1 lb

## 2019-06-30 DIAGNOSIS — R81 Glycosuria: Secondary | ICD-10-CM | POA: Diagnosis not present

## 2019-06-30 DIAGNOSIS — N390 Urinary tract infection, site not specified: Secondary | ICD-10-CM

## 2019-06-30 DIAGNOSIS — N3000 Acute cystitis without hematuria: Secondary | ICD-10-CM | POA: Diagnosis not present

## 2019-06-30 HISTORY — DX: Urinary tract infection, site not specified: N39.0

## 2019-06-30 LAB — POCT URINALYSIS DIPSTICK
Bilirubin, UA: NEGATIVE
Glucose, UA: POSITIVE — AB
Nitrite, UA: POSITIVE
Protein, UA: POSITIVE — AB
Spec Grav, UA: 1.025 (ref 1.010–1.025)
Urobilinogen, UA: 2 E.U./dL — AB
pH, UA: 5 (ref 5.0–8.0)

## 2019-06-30 LAB — POCT GLYCOSYLATED HEMOGLOBIN (HGB A1C): Hemoglobin A1C: 5.4 % (ref 4.0–5.6)

## 2019-06-30 MED ORDER — NITROFURANTOIN MONOHYD MACRO 100 MG PO CAPS: 100.0000 mg | ORAL_CAPSULE | Freq: Two times a day (BID) | ORAL | 0 refills | Status: DC

## 2019-06-30 MED ORDER — SULFAMETHOXAZOLE-TRIMETHOPRIM 400-80 MG PO TABS: 1.0000 | ORAL_TABLET | Freq: Every day | ORAL | 1 refills | Status: DC

## 2019-06-30 NOTE — Progress Notes (Signed)
poc  Acute Office Visit  Subjective:    Patient ID: Sara Sanford, female    DOB: 09-Feb-1956, 63 y.o.   MRN: [id number]  Chief Complaint  Patient presents with  . Urinary Tract Infection    HPI Patient is in today for urinary symptoms that started yesterday.  She did take some Azo last night. She has had urgency and frequency. No fever or chills. No back pain.  No hematuria. Had a UTI in May and June.  She has been on estrogen cream for about 10 minutes and is still getting frequent UTIs.    Past Medical History:  Diagnosis Date  . Heart burn    peptic ulcer  . Lichen sclerosus   . Obesity   . Post-menopausal     Past Surgical History:  Procedure Laterality Date  . CARDIAC CATHETERIZATION  2008  . DILATION AND CURETTAGE OF UTERUS    . HYSTEROSCOPY W/D&C  2008  . KIDNEY STONE SURGERY  1977    Family History  Problem Relation Age of Onset  . Breast cancer Paternal Aunt        pm breast CA  . Breast cancer Paternal Aunt        PM breast CA  . Heart attack Father   . Hypertension Father   . Hypertension Mother     Social History   Socioeconomic History  . Marital status: Married    Spouse name: Not on file  . Number of children: 2  . Years of education: Not on file  . Highest education level: Not on file  Occupational History  . Occupation: Cytogeneticist: [employer]  Social Needs  . Financial resource strain: Not on file  . Food insecurity    Worry: Not on file    Inability: Not on file  . Transportation needs    Medical: Not on file    Non-medical: Not on file  Tobacco Use  . Smoking status: Never Smoker  . Smokeless tobacco: Never Used  Substance and Sexual Activity  . Alcohol use: No  . Drug use: No  . Sexual activity: Yes    Partners: Male  Lifestyle  . Physical activity    Days per week: Not on file    Minutes per session: Not on file  . Stress: Not on file  Relationships  . Social Herbalist on phone: Not on file   Gets together: Not on file    Attends religious service: Not on file    Active member of club or organization: Not on file    Attends meetings of clubs or organizations: Not on file    Relationship status: Not on file  . Intimate partner violence    Fear of current or ex partner: Not on file    Emotionally abused: Not on file    Physically abused: Not on file    Forced sexual activity: Not on file  Other Topics Concern  . Not on file  Social History Narrative   2 mile walk 4 times a week.      Outpatient Medications Prior to Visit  Medication Sig Dispense Refill  . cetirizine (ZYRTEC) 10 MG tablet Take 1 tablet (10 mg total) by mouth daily. 30 tablet 0  . clobetasol cream (TEMOVATE) 0.05 % APPLY  CREAM TWICE TO THREE TIMES A WEEK AT  NIGHT  AS  DIRECTED 60 g 0  . diclofenac sodium (VOLTAREN) 1 % GEL Apply 4 g  topically 4 (four) times daily. To affected joint. 100 g 11  . estradiol (ESTRACE) 0.1 MG/GM vaginal cream Place 1 Applicatorful vaginally at bedtime. X 2 weeks, then 3 x per week. 42.5 g 12  . FLUoxetine (PROZAC) 20 MG capsule Take 1 capsule by mouth once daily 90 capsule 1  . omeprazole (PRILOSEC) 20 MG capsule Take one cap each morning 30 minutes before food.  Take one cap (before eating) each evening for one week. 37 capsule 1   No facility-administered medications prior to visit.     Allergies  Allergen Reactions  . Propoxyphene Other (See Comments) and Shortness Of Breath    Hallucinations Hallucinations    ROS     Objective:    Physical Exam  BP (!) 106/27   Pulse 79   Ht 5\' 2"  (1.575 m)   Wt 249 lb 1.9 oz (113 kg)   SpO2 96%   BMI 45.56 kg/m  Wt Readings from Last 3 Encounters:  06/30/19 249 lb 1.9 oz (113 kg)  05/31/19 252 lb (114.3 kg)  04/10/19 235 lb (106.6 kg)    There are no preventive care reminders to display for this patient.  There are no preventive care reminders to display for this patient.   Lab Results  Component Value Date    TSH 2.439 03/29/2014   Lab Results  Component Value Date   WBC 13.5 (H) 07/28/2014   HGB 13.0 07/28/2014   HCT 38.9 07/28/2014   MCV 93.1 07/28/2014   PLT 232 07/28/2014   Lab Results  Component Value Date   NA 141 08/12/2018   K 4.3 08/12/2018   CO2 29 08/12/2018   GLUCOSE 87 08/12/2018   BUN 14 08/12/2018   CREATININE 0.80 08/12/2018   BILITOT 0.5 08/12/2018   ALKPHOS 72 08/29/2016   AST 19 08/12/2018   ALT 16 08/12/2018   PROT 6.1 08/12/2018   ALBUMIN 4.0 08/29/2016   CALCIUM 9.2 08/12/2018   Lab Results  Component Value Date   CHOL 218 (H) 08/12/2018   Lab Results  Component Value Date   HDL 47 (L) 08/12/2018   Lab Results  Component Value Date   LDLCALC 151 (H) 08/12/2018   Lab Results  Component Value Date   TRIG 93 08/12/2018   Lab Results  Component Value Date   CHOLHDL 4.6 08/12/2018   Lab Results  Component Value Date   HGBA1C 5.4 06/30/2019       Assessment & Plan:   Problem List Items Addressed This Visit      Genitourinary   Recurrent UTI - Primary   Relevant Medications   nitrofurantoin, macrocrystal-monohydrate, (MACROBID) 100 MG capsule   sulfamethoxazole-trimethoprim (BACTRIM) 400-80 MG tablet   Other Relevant Orders   POCT urinalysis dipstick (Completed)   Urine Culture    Other Visit Diagnoses    Glucose found in urine on examination       Relevant Orders   POCT HgB A1C (Completed)   Acute cystitis without hematuria         A1C was normal. No sign of diabetes.    UTI -will treat with nitrofurantoin.  If not better in 1 week please give [place] call back.  Culture sent.  For recurrent UTIs we had a long discussion about prevention.  Says the topical estrogen is not helping then discussed low-dose antibiotic for prevention. Will start low dose Bactrim DS   Meds ordered this encounter  Medications  . nitrofurantoin, macrocrystal-monohydrate, (MACROBID) 100 MG capsule  Sig: Take 1 capsule (100 mg total) by mouth 2 (two)  times daily.    Dispense:  10 capsule    Refill:  0  . sulfamethoxazole-trimethoprim (BACTRIM) 400-80 MG tablet    Sig: Take 1 tablet by mouth daily.    Dispense:  90 tablet    Refill:  1     Beatrice Lecher, MD

## 2019-07-02 LAB — URINE CULTURE
MICRO NUMBER:: 692918
SPECIMEN QUALITY:: ADEQUATE

## 2019-07-13 ENCOUNTER — Ambulatory Visit: Payer: BLUE CROSS/BLUE SHIELD | Admitting: Family Medicine

## 2019-07-19 ENCOUNTER — Other Ambulatory Visit: Payer: Self-pay

## 2019-07-19 ENCOUNTER — Ambulatory Visit (INDEPENDENT_AMBULATORY_CARE_PROVIDER_SITE_OTHER): Payer: BC Managed Care – PPO | Admitting: Family Medicine

## 2019-07-19 ENCOUNTER — Encounter: Payer: Self-pay | Admitting: Family Medicine

## 2019-07-19 VITALS — BP 123/53 | HR 73 | Ht 62.0 in | Wt 249.0 lb

## 2019-07-19 DIAGNOSIS — F411 Generalized anxiety disorder: Secondary | ICD-10-CM

## 2019-07-19 DIAGNOSIS — L821 Other seborrheic keratosis: Secondary | ICD-10-CM | POA: Diagnosis not present

## 2019-07-19 DIAGNOSIS — N390 Urinary tract infection, site not specified: Secondary | ICD-10-CM | POA: Diagnosis not present

## 2019-07-19 NOTE — Assessment & Plan Note (Signed)
Doing well on current regimen.  we can always adjust her dose over the holidays if needed.  F/U in 6 months.

## 2019-07-19 NOTE — Assessment & Plan Note (Signed)
He is on low-dose Bactrim but just started it this week she was a little bit nervous about new starting a new medication but so far so good.

## 2019-07-19 NOTE — Progress Notes (Signed)
Established Patient Office Visit  Subjective:  Patient ID: Sara Sanford, female    DOB: [birth year]  Age: 63 y.o. MRN: [id number]  CC:  Chief Complaint  Patient presents with  . mood     HPI Sara Sanford presents for 32-month follow-up for anxiety.  She is currently on fluoxetine daily.  Her mother had passed away right after Christmas. She is doing ok overall.  Not currently in therapy.    She has a skin lesion on her right upper posterior arm that has been there for years but her husband wanted me to look at it.  She says her dad had similar lestiona.    Recurrent UTI - just started the low dose antibitics.  She decided not to use the estrogen cream.  So far has not had any recurrence and has tolerated the medication well.  She is little bit nervous about starting it.  Past Medical History:  Diagnosis Date  . Heart burn    peptic ulcer  . Lichen sclerosus   . Obesity   . Post-menopausal     Past Surgical History:  Procedure Laterality Date  . CARDIAC CATHETERIZATION  2008  . DILATION AND CURETTAGE OF UTERUS    . HYSTEROSCOPY W/D&C  2008  . KIDNEY STONE SURGERY  1977    Family History  Problem Relation Age of Onset  . Breast cancer Paternal Aunt        pm breast CA  . Breast cancer Paternal Aunt        PM breast CA  . Heart attack Father   . Hypertension Father   . Hypertension Mother     Social History   Socioeconomic History  . Marital status: Married    Spouse name: Not on file  . Number of children: 2  . Years of education: Not on file  . Highest education level: Not on file  Occupational History  . Occupation: Cytogeneticist: [employer]  Social Needs  . Financial resource strain: Not on file  . Food insecurity    Worry: Not on file    Inability: Not on file  . Transportation needs    Medical: Not on file    Non-medical: Not on file  Tobacco Use  . Smoking status: Never Smoker  . Smokeless tobacco: Never Used  Substance and  Sexual Activity  . Alcohol use: No  . Drug use: No  . Sexual activity: Yes    Partners: Male  Lifestyle  . Physical activity    Days per week: Not on file    Minutes per session: Not on file  . Stress: Not on file  Relationships  . Social Herbalist on phone: Not on file    Gets together: Not on file    Attends religious service: Not on file    Active member of club or organization: Not on file    Attends meetings of clubs or organizations: Not on file    Relationship status: Not on file  . Intimate partner violence    Fear of current or ex partner: Not on file    Emotionally abused: Not on file    Physically abused: Not on file    Forced sexual activity: Not on file  Other Topics Concern  . Not on file  Social History Narrative   2 mile walk 4 times a week.      Outpatient Medications Prior to Visit  Medication Sig Dispense Refill  .  cetirizine (ZYRTEC) 10 MG tablet Take 1 tablet (10 mg total) by mouth daily. 30 tablet 0  . clobetasol cream (TEMOVATE) 0.05 % APPLY  CREAM TWICE TO THREE TIMES A WEEK AT  NIGHT  AS  DIRECTED 60 g 0  . diclofenac sodium (VOLTAREN) 1 % GEL Apply 4 g topically 4 (four) times daily. To affected joint. 100 g 11  . FLUoxetine (PROZAC) 20 MG capsule Take 1 capsule by mouth once daily 90 capsule 1  . omeprazole (PRILOSEC) 20 MG capsule Take one cap each morning 30 minutes before food.  Take one cap (before eating) each evening for one week. 37 capsule 1  . sulfamethoxazole-trimethoprim (BACTRIM) 400-80 MG tablet Take 1 tablet by mouth daily.    Marland Kitchen estradiol (ESTRACE) 0.1 MG/GM vaginal cream Place 1 Applicatorful vaginally at bedtime. X 2 weeks, then 3 x per week. 42.5 g 12  . nitrofurantoin, macrocrystal-monohydrate, (MACROBID) 100 MG capsule Take 1 capsule (100 mg total) by mouth 2 (two) times daily. 10 capsule 0  . sulfamethoxazole-trimethoprim (BACTRIM) 400-80 MG tablet Take 1 tablet by mouth daily. 90 tablet 1   No facility-administered  medications prior to visit.     Allergies  Allergen Reactions  . Propoxyphene Other (See Comments) and Shortness Of Breath    Hallucinations Hallucinations    ROS Review of Systems    Objective:    Physical Exam  Constitutional: She is oriented to person, place, and time. She appears well-developed and well-nourished.  HENT:  Head: Normocephalic and atraumatic.  Cardiovascular: Normal rate, regular rhythm and normal heart sounds.  Pulmonary/Chest: Effort normal and breath sounds normal.  Neurological: She is alert and oriented to person, place, and time.  Skin: Skin is warm and dry.  Skin lesion on her right upper posterior arm is raised with a rough texture most consistent with a seborrheic keratosis.  Psychiatric: She has a normal mood and affect. Her behavior is normal.    BP (!) 123/53   Pulse 73   Ht 5\' 2"  (1.575 m)   Wt 249 lb (112.9 kg)   SpO2 96%   BMI 45.54 kg/m  Wt Readings from Last 3 Encounters:  07/19/19 249 lb (112.9 kg)  06/30/19 249 lb 1.9 oz (113 kg)  05/31/19 252 lb (114.3 kg)     There are no preventive care reminders to display for this patient.  There are no preventive care reminders to display for this patient.  Lab Results  Component Value Date   TSH 2.439 03/29/2014   Lab Results  Component Value Date   WBC 13.5 (H) 07/28/2014   HGB 13.0 07/28/2014   HCT 38.9 07/28/2014   MCV 93.1 07/28/2014   PLT 232 07/28/2014   Lab Results  Component Value Date   NA 141 08/12/2018   K 4.3 08/12/2018   CO2 29 08/12/2018   GLUCOSE 87 08/12/2018   BUN 14 08/12/2018   CREATININE 0.80 08/12/2018   BILITOT 0.5 08/12/2018   ALKPHOS 72 08/29/2016   AST 19 08/12/2018   ALT 16 08/12/2018   PROT 6.1 08/12/2018   ALBUMIN 4.0 08/29/2016   CALCIUM 9.2 08/12/2018   Lab Results  Component Value Date   CHOL 218 (H) 08/12/2018   Lab Results  Component Value Date   HDL 47 (L) 08/12/2018   Lab Results  Component Value Date   LDLCALC 151 (H)  08/12/2018   Lab Results  Component Value Date   TRIG 93 08/12/2018   Lab Results  Component  Value Date   CHOLHDL 4.6 08/12/2018   Lab Results  Component Value Date   HGBA1C 5.4 06/30/2019      Assessment & Plan:   Problem List Items Addressed This Visit      Genitourinary   Recurrent UTI    He is on low-dose Bactrim but just started it this week she was a little bit nervous about new starting a new medication but so far so good.      Relevant Medications   sulfamethoxazole-trimethoprim (BACTRIM) 400-80 MG tablet     Other   GAD (generalized anxiety disorder) - Primary    Doing well on current regimen.  we can always adjust her dose over the holidays if needed.  F/U in 6 months.         Other Visit Diagnoses    Seborrheic keratoses         Lesion on her arm is most consistent with a seborrheic keratosis.  She has a couple on both upper outer arms.  I am happy to treat those with cryotherapy if she would like.  Certainly keep an eye on them if she feels like they are changing or getting larger then let me know sooner.  No orders of the defined types were placed in this encounter.   Follow-up: Return in about 6 months (around 01/19/2020) for Medication/Anxiety.    Beatrice Lecher, MD

## 2019-08-09 ENCOUNTER — Ambulatory Visit: Payer: BC Managed Care – PPO

## 2019-08-09 ENCOUNTER — Ambulatory Visit (INDEPENDENT_AMBULATORY_CARE_PROVIDER_SITE_OTHER): Payer: BC Managed Care – PPO | Admitting: Family Medicine

## 2019-08-09 ENCOUNTER — Other Ambulatory Visit: Payer: Self-pay

## 2019-08-09 ENCOUNTER — Ambulatory Visit (INDEPENDENT_AMBULATORY_CARE_PROVIDER_SITE_OTHER): Payer: BC Managed Care – PPO

## 2019-08-09 VITALS — BP 108/55 | HR 79 | Temp 98.3°F | Ht 62.0 in | Wt 253.0 lb

## 2019-08-09 DIAGNOSIS — D72829 Elevated white blood cell count, unspecified: Secondary | ICD-10-CM

## 2019-08-09 DIAGNOSIS — R0602 Shortness of breath: Secondary | ICD-10-CM

## 2019-08-09 DIAGNOSIS — M79605 Pain in left leg: Secondary | ICD-10-CM | POA: Diagnosis not present

## 2019-08-09 DIAGNOSIS — M7989 Other specified soft tissue disorders: Secondary | ICD-10-CM

## 2019-08-09 DIAGNOSIS — D72820 Lymphocytosis (symptomatic): Secondary | ICD-10-CM

## 2019-08-09 DIAGNOSIS — R0789 Other chest pain: Secondary | ICD-10-CM | POA: Diagnosis not present

## 2019-08-09 NOTE — Progress Notes (Addendum)
Acute Office Visit  Subjective:    Patient ID: Sara Sanford, female    DOB: 1956-04-11, 63 y.o.   MRN: [id number]  Chief Complaint  Patient presents with  . Leg Swelling    L leg x 3 days,denies f/s/c/n/v/d. she has had some SOB w/exertion and CP on/off she stated that this is not uncommon for her. she has been elevating her legs which helps    HPI Patient is in today for Left leg swelling x 4 days and has been so swollen it is leaking fluid. She says she has pain going up her leg to her thigh.  Denies any known injury or trauma.  She denies any changes to her routine or excess sitting or long car rides.  She complains of pain going from the calf muscle radiating up posteriorly to the posterior upper thigh.  But she is also been having a lot of pain anteriorly over the knee and just distal and proximal to the knee.  No fever or chills or sweats.  She has also been SOB and having some chest pain.  Shortness of breath is mostly with activity.  In the chest pain she says can happen anytime.  It is really just more of a brief discomfort in the mid chest numbness going into her back.  It can just last for a few seconds and then seems to resolve on its own.  It can happen when at rest or with activity.  She has had similar symptoms before.  Her weight is up about 4 pounds since the last time she was here.  Knee hurts worse with activity.  Past Medical History:  Diagnosis Date  . Heart burn    peptic ulcer  . Lichen sclerosus   . Obesity   . Post-menopausal     Past Surgical History:  Procedure Laterality Date  . CARDIAC CATHETERIZATION  2008  . DILATION AND CURETTAGE OF UTERUS    . HYSTEROSCOPY W/D&C  2008  . KIDNEY STONE SURGERY  1977    Family History  Problem Relation Age of Onset  . Breast cancer Paternal Aunt        pm breast CA  . Breast cancer Paternal Aunt        PM breast CA  . Heart attack Father   . Hypertension Father   . Hypertension Mother     Social  History   Socioeconomic History  . Marital status: Married    Spouse name: Not on file  . Number of children: 2  . Years of education: Not on file  . Highest education level: Not on file  Occupational History  . Occupation: Cytogeneticist: [employer]  Social Needs  . Financial resource strain: Not on file  . Food insecurity    Worry: Not on file    Inability: Not on file  . Transportation needs    Medical: Not on file    Non-medical: Not on file  Tobacco Use  . Smoking status: Never Smoker  . Smokeless tobacco: Never Used  Substance and Sexual Activity  . Alcohol use: No  . Drug use: No  . Sexual activity: Yes    Partners: Male  Lifestyle  . Physical activity    Days per week: Not on file    Minutes per session: Not on file  . Stress: Not on file  Relationships  . Social Herbalist on phone: Not on file    Gets  together: Not on file    Attends religious service: Not on file    Active member of club or organization: Not on file    Attends meetings of clubs or organizations: Not on file    Relationship status: Not on file  . Intimate partner violence    Fear of current or ex partner: Not on file    Emotionally abused: Not on file    Physically abused: Not on file    Forced sexual activity: Not on file  Other Topics Concern  . Not on file  Social History Narrative   2 mile walk 4 times a week.      Outpatient Medications Prior to Visit  Medication Sig Dispense Refill  . clobetasol cream (TEMOVATE) 0.05 % APPLY  CREAM TWICE TO THREE TIMES A WEEK AT  NIGHT  AS  DIRECTED 60 g 0  . FLUoxetine (PROZAC) 20 MG capsule Take 1 capsule by mouth once daily 90 capsule 1  . omeprazole (PRILOSEC) 20 MG capsule Take one cap each morning 30 minutes before food.  Take one cap (before eating) each evening for one week. 37 capsule 1  . cetirizine (ZYRTEC) 10 MG tablet Take 1 tablet (10 mg total) by mouth daily. (Patient not taking: Reported on 08/09/2019) 30 tablet 0   . diclofenac sodium (VOLTAREN) 1 % GEL Apply 4 g topically 4 (four) times daily. To affected joint. (Patient not taking: Reported on 08/09/2019) 100 g 11  . sulfamethoxazole-trimethoprim (BACTRIM) 400-80 MG tablet Take 1 tablet by mouth daily.     No facility-administered medications prior to visit.     Allergies  Allergen Reactions  . Propoxyphene Other (See Comments) and Shortness Of Breath    Hallucinations Hallucinations    ROS     Objective:    Physical Exam  Constitutional: She is oriented to person, place, and time. She appears well-developed and well-nourished.  HENT:  Head: Normocephalic and atraumatic.  Cardiovascular: Normal rate, regular rhythm and normal heart sounds.  Pulmonary/Chest: Effort normal and breath sounds normal.  Musculoskeletal:     Comments: Trace edema of the left lower leg.  She is tender with calf squeeze.  No significant erythema or rash.  Neurological: She is alert and oriented to person, place, and time.  Skin: Skin is warm and dry.  Psychiatric: She has a normal mood and affect. Her behavior is normal.    BP (!) 108/55   Pulse 79   Temp 98.3 F (36.8 C)   Ht 5\' 2"  (1.575 m)   Wt 253 lb (114.8 kg)   SpO2 99%   BMI 46.27 kg/m  Wt Readings from Last 3 Encounters:  08/09/19 253 lb (114.8 kg)  07/19/19 249 lb (112.9 kg)  06/30/19 249 lb 1.9 oz (113 kg)    There are no preventive care reminders to display for this patient.  There are no preventive care reminders to display for this patient.   Lab Results  Component Value Date   TSH 2.439 03/29/2014   Lab Results  Component Value Date   WBC 13.5 (H) 07/28/2014   HGB 13.0 07/28/2014   HCT 38.9 07/28/2014   MCV 93.1 07/28/2014   PLT 232 07/28/2014   Lab Results  Component Value Date   NA 141 08/12/2018   K 4.3 08/12/2018   CO2 29 08/12/2018   GLUCOSE 87 08/12/2018   BUN 14 08/12/2018   CREATININE 0.80 08/12/2018   BILITOT 0.5 08/12/2018   ALKPHOS 72 08/29/2016  AST  19 08/12/2018   ALT 16 08/12/2018   PROT 6.1 08/12/2018   ALBUMIN 4.0 08/29/2016   CALCIUM 9.2 08/12/2018   Lab Results  Component Value Date   CHOL 218 (H) 08/12/2018   Lab Results  Component Value Date   HDL 47 (L) 08/12/2018   Lab Results  Component Value Date   LDLCALC 151 (H) 08/12/2018   Lab Results  Component Value Date   TRIG 93 08/12/2018   Lab Results  Component Value Date   CHOLHDL 4.6 08/12/2018   Lab Results  Component Value Date   HGBA1C 5.4 06/30/2019       Assessment & Plan:   Problem List Items Addressed This Visit    None    Visit Diagnoses    Left leg swelling    -  Primary   Relevant Orders   US Venous Img Lower Unilateral Left   Left leg pain       Relevant Orders   US Venous Img Lower Unilateral Left   SOB (shortness of breath)       Relevant Orders   US Venous Img Lower Unilateral Left   CBC   COMPLETE METABOLIC PANEL WITH GFR   TSH   B Nat Peptide   Urinalysis, Routine w reflex microscopic   DG Chest 2 View   Atypical chest pain       Relevant Orders   US Venous Img Lower Unilateral Left   CBC   COMPLETE METABOLIC PANEL WITH GFR   TSH   B Nat Peptide   Urinalysis, Routine w reflex microscopic   DG Chest 2 View     Atypical chest pain-unclear etiology we will do some labs and additional work-up I do not think it is cardiac related but could be a sign or symptom of heart failure so we will check a BNP and do further work-up if the BNP is elevated.  Shortness of breath-may be secondary to increased fluid retention.  Once we rule out a DVT then consider addition of a diuretic for the next couple of days.  No cough or abnormal weight loss.  No fever.  Left leg pain and swelling-unclear why she had an abrupt onset of swelling on Friday.  Consider differential including a DVT which I am somewhat suspicious of even though she does not have any significant risk factors but based on her distribution of pain and the sudden onset I am  concerned that I do think we need to rule it out.  Consider adding a diuretic if everything comes back negative.  We will do additional labs as well to rule out change in renal function, thyroid problems etc.  No orders of the defined types were placed in this encounter.    Beatrice Lecher, MD

## 2019-08-10 LAB — CBC
HCT: 34.1 % — ABNORMAL LOW (ref 35.0–45.0)
Hemoglobin: 11 g/dL — ABNORMAL LOW (ref 11.7–15.5)
MCH: 32 pg (ref 27.0–33.0)
MCHC: 32.3 g/dL (ref 32.0–36.0)
MCV: 99.1 fL (ref 80.0–100.0)
MPV: 9.9 fL (ref 7.5–12.5)
Platelets: 200 10*3/uL (ref 140–400)
RBC: 3.44 10*6/uL — ABNORMAL LOW (ref 3.80–5.10)
RDW: 13.6 % (ref 11.0–15.0)
WBC: 71 10*3/uL — ABNORMAL HIGH (ref 3.8–10.8)

## 2019-08-10 LAB — BRAIN NATRIURETIC PEPTIDE: Brain Natriuretic Peptide: 41 pg/mL (ref <100)

## 2019-08-10 LAB — COMPLETE METABOLIC PANEL WITH GFR
AG Ratio: 2.2 (calc) (ref 1.0–2.5)
ALT: 18 U/L (ref 6–29)
AST: 23 U/L (ref 10–35)
Albumin: 4 g/dL (ref 3.6–5.1)
Alkaline phosphatase (APISO): 86 U/L (ref 37–153)
BUN: 15 mg/dL (ref 7–25)
CO2: 29 mmol/L (ref 20–32)
Calcium: 8.9 mg/dL (ref 8.6–10.4)
Chloride: 108 mmol/L (ref 98–110)
Creat: 0.77 mg/dL (ref 0.50–0.99)
GFR, Est African American: 95 mL/min/{1.73_m2} (ref >=60)
GFR, Est Non African American: 82 mL/min/{1.73_m2} (ref >=60)
Globulin: 1.8 g/dL (calc) — ABNORMAL LOW (ref 1.9–3.7)
Glucose, Bld: 85 mg/dL (ref 65–99)
Potassium: 4.4 mmol/L (ref 3.5–5.3)
Sodium: 141 mmol/L (ref 135–146)
Total Bilirubin: 0.5 mg/dL (ref 0.2–1.2)
Total Protein: 5.8 g/dL — ABNORMAL LOW (ref 6.1–8.1)

## 2019-08-10 LAB — URINALYSIS, ROUTINE W REFLEX MICROSCOPIC
Bilirubin Urine: NEGATIVE
Glucose, UA: NEGATIVE
Hgb urine dipstick: NEGATIVE
Ketones, ur: NEGATIVE
Leukocytes,Ua: NEGATIVE
Nitrite: NEGATIVE
Protein, ur: NEGATIVE
Specific Gravity, Urine: 1.022 (ref 1.001–1.03)
pH: 6.5 (ref 5.0–8.0)

## 2019-08-10 LAB — TSH: TSH: 1.98 mIU/L (ref 0.40–4.50)

## 2019-08-11 NOTE — Addendum Note (Signed)
Addended by: Beatrice Lecher D on: 08/11/2019 04:01 PM   Modules accepted: Orders

## 2019-08-12 ENCOUNTER — Encounter: Payer: Self-pay | Admitting: *Deleted

## 2019-08-12 ENCOUNTER — Other Ambulatory Visit: Payer: Self-pay | Admitting: Hematology

## 2019-08-12 DIAGNOSIS — D539 Nutritional anemia, unspecified: Secondary | ICD-10-CM

## 2019-08-12 DIAGNOSIS — C9110 Chronic lymphocytic leukemia of B-cell type not having achieved remission: Secondary | ICD-10-CM | POA: Insufficient documentation

## 2019-08-12 DIAGNOSIS — D72820 Lymphocytosis (symptomatic): Secondary | ICD-10-CM

## 2019-08-12 HISTORY — DX: Chronic lymphocytic leukemia of B-cell type not having achieved remission: C91.10

## 2019-08-12 NOTE — Addendum Note (Signed)
Addended by: Beatrice Lecher D on: 08/12/2019 08:27 AM   Modules accepted: Orders

## 2019-08-12 NOTE — Progress Notes (Signed)
Reached out to [employer] to introduce myself as the office RN Navigator and explain our new patient process. Reviewed the reason for their referral and scheduled their new patient appointment along with labs. Provided address and directions to the office including call back phone number. Reviewed with patient any concerns they may have or any possible barriers to attending their appointment.   Informed patient about my role as a navigator and that I will meet with them prior to their New Patient appointment and more fully discuss what services I can provide. At this time patient has no further questions or needs.

## 2019-08-12 NOTE — Progress Notes (Signed)
[facility] CONSULT NOTE  Patient Care Team: Hali Marry, MD as PCP - General (Family Medicine)  HEME/ONC OVERVIEW: 1. Leukocytosis  -WBC mid-70k's with lymphocytic predominance, Hgb ~11 w/ MCV in low 100's, normal plts   ASSESSMENT & PLAN:   Leukocytosis -I reviewed the patient's records in detail, including PCP clinic notes and lab studies -In summary, patient presented to her PCP in late 07/2019 for evaluation of persistent left lower extremity swelling, associated with vague chest discomfort and shortness of breath.  Her CBC showed incidental WBC 71k.  Repeat CBC confirmed WBC of 78k with lymphocytic predominance, mild macrocytic anemia, and normal platelet count.  Prior to her recent clinic visit, her last CBC was done in 2015, which showed WBC between 10 and 13k with lymphocytic predominance.  Patient was referred to hematology for further evaluation. -I reviewed the lab results in detail with the patient -WBC 75k today, stable  -I personally reviewed the patient's peripheral smear, which showed increased number of mature lymphocytes with scattered smudge cells, c/w CLL.  WBC morphology was otherwise relatively normal and there was no circulating blast.  RBC was borderline enlarged but there was no significant schistocytosis.  There was no platelet clumping.  -Given significant leukocytosis with lymphocytic predominance and the presence of smudge cells on the peripheral smear, this is consistent with CLL -I have ordered peripheral blood flow cytometry to confirm CLL, as well as CLL FISH panel and molecular studies to assess for tp53 and IgHV mutation status  -In addition, I have ordered CT neck and CAP to assess for any lymphadenopathy and bulky disease  -If CT does not demonstrate any bulky disease, and her Hgb/plts remain stable, there is no urgent indication to initiate treatment  -See the management of macrocytic anemia below   Macrocytic anemia -Possibly  related to CLL vs. nutritional deficiency -Hgb 11.3 today; B12 level borderline low, folate level normal -I have prescribed B12 supplement 1073mg daily  -In addition, I have ordered [place] study to rule out hemolytic anemia, which can occur due to autoimmune process associated with CLL -Finally, as the patient has not had any colonoscopy in over 10 years, I have referred her to GI for colon cancer screening and to rule out any potential GI bleeding   Orders Placed This Encounter  Procedures  . CT SOFT TISSUE NECK W CONTRAST    Standing Status:   Future    Standing Expiration Date:   08/18/2020    Order Specific Question:   ** REASON FOR EXAM (FREE TEXT)    Answer:   Lymphocytosis, c/w CLL; assess lymphadenopathy    Order Specific Question:   If indicated for the ordered procedure, I authorize the administration of contrast media per Radiology protocol    Answer:   Yes    Order Specific Question:   Preferred imaging location?    Answer:   [facility]   Radiology Contrast Protocol - do NOT remove file path    Answer:   \\[email or web address].[email or web address]  . CT CHEST W CONTRAST    Standing Status:   Future    Standing Expiration Date:   08/18/2020    Order Specific Question:   ** REASON FOR EXAM (FREE TEXT)    Answer:   Lymphocytosis c/w CLL; assess for lymphadenopathy    Order Specific Question:   If indicated for the ordered procedure, I authorize the administration of contrast media per Radiology protocol  Answer:   Yes    Order Specific Question:   Preferred imaging location?    Answer:   [facility] Specific Question:   Radiology Contrast Protocol - do NOT remove file path    Answer:   \\[email or web address].[email or web address]  . CT ABDOMEN PELVIS W CONTRAST    Standing Status:   Future    Standing Expiration Date:   08/18/2020    Order Specific Question:   ** REASON FOR EXAM (FREE TEXT)    Answer:   Lymphocytosis,  c/w CLL; assess lymphadenopathy    Order Specific Question:   If indicated for the ordered procedure, I authorize the administration of contrast media per Radiology protocol    Answer:   Yes    Order Specific Question:   Preferred imaging location?    Answer:   [facility] Specific Question:   Is Oral Contrast requested for this exam?    Answer:   Yes, Per Radiology protocol    Order Specific Question:   Radiology Contrast Protocol - do NOT remove file path    Answer:   \\[email or web address]  . CBC with Differential ([facility] Only)    Standing Status:   Future    Standing Expiration Date:   09/22/2020  . CMP ([facility] only)    Standing Status:   Future    Standing Expiration Date:   09/22/2020  . Save Smear (SS[facility])    Standing Status:   Future    Standing Expiration Date:   08/18/2020  . Lactate dehydrogenase    Standing Status:   Future    Standing Expiration Date:   09/22/2020  . Vitamin B12    Standing Status:   Future    Standing Expiration Date:   09/22/2020  . Methylmalonic acid, serum    Standing Status:   Future    Standing Expiration Date:   09/22/2020  . Ambulatory referral to Gastroenterology    Referral Priority:   Routine    Referral Type:   Consultation    Referral Reason:   Specialty Services Required    Referred to Provider:   Lavena Bullion, DO    Number of Visits Requested:   1   All questions were answered. The patient knows to call the clinic with any problems, questions or concerns.  Return in ~2 weeks for labs, imaging results and clinic appt.   Tish Men, MD 08/19/2019 3:02 PM   CHIEF COMPLAINTS/PURPOSE OF CONSULTATION:  "I am just a little anxious"  HISTORY OF PRESENTING ILLNESS:  Sara Sanford 63 y.o. female is here because of new onset significant leukocytosis.  Patient presented to her PCP in late 07/2019 for routine follow-up, and ber CBC showed incidental WBC 71k.  Repeat CBC confirmed  WBC of 78k with lymphocytic predominance, mild macrocytic anemia, and normal platelet count.  Prior to her recent clinic visit, her last CBC was done in 2015, which showed WBC between 10 and 13k with lymphocytic predominance.  Patient was referred to hematology for further evaluation.  Patient reports that she has had chronic, intermittent, right-sided neck pain, achy, occasionally sharp, associated with periodic numbness sensation in the right hand, and exacerbated by certain positions, such as holding a steering wheel for 10-20 minutes.  She discussed these symptoms with her PCP, who told her that she had "arthritis."  She had some night sweats a few years ago, which she attributed to menopause, but she has not had  any recurrent night sweats since then.  She denies any constitutional symptoms or enlarged LN's.  She has hx of recurrent UTI's, for which she was prescribed a suppressive abx, but she is not taking it due to concern about taking too many medications.  She otherwise feels well and denies any other complaint.   I have reviewed her chart and materials related to her cancer extensively and collaborated history with the patient. Summary of oncologic history is as follows: Oncology History   No history exists.    MEDICAL HISTORY:  Past Medical History:  Diagnosis Date  . Heart burn    peptic ulcer  . Lichen sclerosus   . Obesity   . Post-menopausal     SURGICAL HISTORY: Past Surgical History:  Procedure Laterality Date  . CARDIAC CATHETERIZATION  2008  . DILATION AND CURETTAGE OF UTERUS    . HYSTEROSCOPY W/D&C  2008  . KIDNEY STONE SURGERY  1977    SOCIAL HISTORY: Social History   Socioeconomic History  . Marital status: Married    Spouse name: Not on file  . Number of children: 2  . Years of education: Not on file  . Highest education level: Not on file  Occupational History  . Occupation: Cytogeneticist: [employer]  Social Needs  . Financial resource strain:  Not on file  . Food insecurity    Worry: Not on file    Inability: Not on file  . Transportation needs    Medical: Not on file    Non-medical: Not on file  Tobacco Use  . Smoking status: Never Smoker  . Smokeless tobacco: Never Used  Substance and Sexual Activity  . Alcohol use: No  . Drug use: No  . Sexual activity: Yes    Partners: Male  Lifestyle  . Physical activity    Days per week: Not on file    Minutes per session: Not on file  . Stress: Not on file  Relationships  . Social Herbalist on phone: Not on file    Gets together: Not on file    Attends religious service: Not on file    Active member of club or organization: Not on file    Attends meetings of clubs or organizations: Not on file    Relationship status: Not on file  . Intimate partner violence    Fear of current or ex partner: Not on file    Emotionally abused: Not on file    Physically abused: Not on file    Forced sexual activity: Not on file  Other Topics Concern  . Not on file  Social History Narrative   2 mile walk 4 times a week.      FAMILY HISTORY: Family History  Problem Relation Age of Onset  . Breast cancer Paternal Aunt        pm breast CA  . Breast cancer Paternal Aunt        PM breast CA  . Heart attack Father   . Hypertension Father   . Hypertension Mother     ALLERGIES:  is allergic to propoxyphene.  MEDICATIONS:  Current Outpatient Medications  Medication Sig Dispense Refill  . clobetasol cream (TEMOVATE) 0.05 % APPLY  CREAM TWICE TO THREE TIMES A WEEK AT  NIGHT  AS  DIRECTED 60 g 0  . FLUoxetine (PROZAC) 20 MG capsule Take 1 capsule by mouth once daily 90 capsule 1  . omeprazole (PRILOSEC) 20 MG capsule  Take one cap each morning 30 minutes before food.  Take one cap (before eating) each evening for one week. 37 capsule 1  . vitamin B-12 (CYANOCOBALAMIN) 500 MCG tablet Take 2 tablets (1,000 mcg total) by mouth daily. 180 tablet 3   No current facility-administered  medications for this visit.     REVIEW OF SYSTEMS:   Constitutional: ( - ) fevers, ( - )  chills , ( - ) night sweats Eyes: ( - ) blurriness of vision, ( - ) double vision, ( - ) watery eyes Ears, nose, mouth, throat, and face: ( - ) mucositis, ( - ) sore throat Respiratory: ( - ) cough, ( - ) dyspnea, ( - ) wheezes Cardiovascular: ( - ) palpitation, ( - ) chest discomfort, ( - ) lower extremity swelling Gastrointestinal:  ( - ) nausea, ( - ) heartburn, ( - ) change in bowel habits Skin: ( - ) abnormal skin rashes Lymphatics: ( - ) new lymphadenopathy, ( - ) easy bruising Neurological: ( - ) numbness, ( - ) tingling, ( - ) new weaknesses Behavioral/Psych: ( - ) mood change, ( - ) new changes  All other systems were reviewed with the patient and are negative.  PHYSICAL EXAMINATION: ECOG PERFORMANCE STATUS: 1 - Symptomatic but completely ambulatory  Vitals:   08/19/19 1426  BP: 122/62  Pulse: 64  Resp: 18  Temp: 98 F (36.7 C)  SpO2: 100%   Filed Weights   08/19/19 1426  Weight: 249 lb (112.9 kg)    GENERAL: alert, no distress and comfortable SKIN: skin color, texture, turgor are normal, no rashes or significant lesions EYES: conjunctiva are pink and non-injected, sclera clear OROPHARYNX: no exudate, no erythema; lips, buccal mucosa, and tongue normal  NECK: supple, non-tender LYMPH:  no palpable lymphadenopathy in the cervical or axillary bilaterally  LUNGS: clear to auscultation with normal breathing effort HEART: regular rate & rhythm, no murmurs, no lower extremity edema ABDOMEN: soft, non-tender, non-distended, normal bowel sounds Musculoskeletal: no cyanosis of digits and no clubbing  PSYCH: alert & oriented x 3, fluent speech NEURO: no focal motor/sensory deficits  LABORATORY DATA:  I have reviewed the data as listed Lab Results  Component Value Date   WBC 75.5 (HH) 08/19/2019   HGB 11.3 (L) 08/19/2019   HCT 35.3 (L) 08/19/2019   MCV 99.4 08/19/2019   PLT  196 08/19/2019   Lab Results  Component Value Date   NA 141 08/19/2019   K 4.8 08/19/2019   CL 106 08/19/2019   CO2 29 08/19/2019    RADIOGRAPHIC STUDIES: I have personally reviewed the radiological images as listed and agreed with the findings in the report. Dg Chest 2 View  Result Date: 08/09/2019 CLINICAL DATA:  Shortness of breath and atypical chest pain. Bilateral lower extremity swelling. EXAM: CHEST - 2 VIEW COMPARISON:  None. FINDINGS: The heart size and mediastinal contours are within normal limits. Both lungs are clear. The visualized skeletal structures are unremarkable. IMPRESSION: No active cardiopulmonary disease. Electronically Signed   By: Marlaine Hind M.D.   On: 08/09/2019 11:50   US Venous Img Lower Unilateral Left  Result Date: 08/09/2019 CLINICAL DATA:  Left lower extremity pain and edema. Shortness of breath for the past 2 weeks. Evaluate for DVT. EXAM: LEFT LOWER EXTREMITY VENOUS DOPPLER ULTRASOUND TECHNIQUE: Gray-scale sonography with graded compression, as well as color Doppler and duplex ultrasound were performed to evaluate the lower extremity deep venous systems from the level of the common  femoral vein and including the common femoral, femoral, profunda femoral, popliteal and calf veins including the posterior tibial, peroneal and gastrocnemius veins when visible. The superficial great saphenous vein was also interrogated. Spectral Doppler was utilized to evaluate flow at rest and with distal augmentation maneuvers in the common femoral, femoral and popliteal veins. COMPARISON:  None. FINDINGS: Examination is degraded due to patient body habitus and poor sonographic window. Contralateral Common Femoral Vein: Respiratory phasicity is normal and symmetric with the symptomatic side. No evidence of thrombus. Normal compressibility. Common Femoral Vein: No evidence of thrombus. Normal compressibility, respiratory phasicity and response to augmentation. Saphenofemoral  Junction: No evidence of thrombus. Normal compressibility and flow on color Doppler imaging. Profunda Femoral Vein: No evidence of thrombus. Normal compressibility and flow on color Doppler imaging. Femoral Vein: No evidence of thrombus. Normal compressibility, respiratory phasicity and response to augmentation. Popliteal Vein: No evidence of thrombus. Normal compressibility, respiratory phasicity and response to augmentation. Calf Veins: No evidence of thrombus. Normal compressibility and flow on color Doppler imaging. Superficial Great Saphenous Vein: No evidence of thrombus. Normal compressibility. Venous Reflux:  None. Other Findings:  None. IMPRESSION: No evidence of DVT within the left lower extremity. Electronically Signed   By: Sandi Mariscal M.D.   On: 08/09/2019 12:14    PATHOLOGY: I have reviewed the pathology reports as documented in the oncologist history.

## 2019-08-14 LAB — CBC WITH DIFFERENTIAL/PLATELET
Absolute Monocytes: 1335 cells/uL — ABNORMAL HIGH (ref 200–950)
Basophils Absolute: 79 cells/uL (ref 0–200)
Basophils Relative: 0.1 %
Eosinophils Absolute: 157 cells/uL (ref 15–500)
Eosinophils Relative: 0.2 %
HCT: 34.9 % — ABNORMAL LOW (ref 35.0–45.0)
Hemoglobin: 11.4 g/dL — ABNORMAL LOW (ref 11.7–15.5)
Lymphs Abs: 74497 cells/uL — ABNORMAL HIGH (ref 850–3900)
MCH: 32.8 pg (ref 27.0–33.0)
MCHC: 32.7 g/dL (ref 32.0–36.0)
MCV: 100.3 fL — ABNORMAL HIGH (ref 80.0–100.0)
MPV: 10 fL (ref 7.5–12.5)
Monocytes Relative: 1.7 %
Neutro Abs: 2434 cells/uL (ref 1500–7800)
Neutrophils Relative %: 3.1 %
Platelets: 211 10*3/uL (ref 140–400)
RBC: 3.48 10*6/uL — ABNORMAL LOW (ref 3.80–5.10)
RDW: 13.8 % (ref 11.0–15.0)
Total Lymphocyte: 94.9 %
WBC: 78.5 10*3/uL — ABNORMAL HIGH (ref 3.8–10.8)

## 2019-08-14 LAB — SEDIMENTATION RATE: Sed Rate: 17 mm/h (ref 0–30)

## 2019-08-14 LAB — B12 AND FOLATE PANEL
Folate: 11.7 ng/mL
Vitamin B-12: 346 pg/mL (ref 200–1100)

## 2019-08-14 LAB — PATHOLOGIST SMEAR REVIEW

## 2019-08-14 LAB — C-REACTIVE PROTEIN: CRP: 2.1 mg/L (ref <8.0)

## 2019-08-14 LAB — D-DIMER, QUANTITATIVE: D-Dimer, Quant: 1.07 mcg/mL FEU — ABNORMAL HIGH (ref <0.50)

## 2019-08-19 ENCOUNTER — Other Ambulatory Visit: Payer: Self-pay

## 2019-08-19 ENCOUNTER — Inpatient Hospital Stay (HOSPITAL_BASED_OUTPATIENT_CLINIC_OR_DEPARTMENT_OTHER): Payer: Medicare Other | Admitting: Hematology

## 2019-08-19 ENCOUNTER — Encounter: Payer: Self-pay | Admitting: Hematology

## 2019-08-19 ENCOUNTER — Encounter: Payer: Self-pay | Admitting: *Deleted

## 2019-08-19 ENCOUNTER — Inpatient Hospital Stay: Payer: Medicare Other | Attending: Hematology & Oncology

## 2019-08-19 VITALS — BP 122/62 | HR 64 | Temp 98.0°F | Resp 18 | Ht 62.0 in | Wt 249.0 lb

## 2019-08-19 DIAGNOSIS — D539 Nutritional anemia, unspecified: Secondary | ICD-10-CM

## 2019-08-19 DIAGNOSIS — R0602 Shortness of breath: Secondary | ICD-10-CM | POA: Insufficient documentation

## 2019-08-19 DIAGNOSIS — R590 Localized enlarged lymph nodes: Secondary | ICD-10-CM | POA: Insufficient documentation

## 2019-08-19 DIAGNOSIS — D72820 Lymphocytosis (symptomatic): Secondary | ICD-10-CM

## 2019-08-19 DIAGNOSIS — M79605 Pain in left leg: Secondary | ICD-10-CM | POA: Insufficient documentation

## 2019-08-19 DIAGNOSIS — Z79899 Other long term (current) drug therapy: Secondary | ICD-10-CM

## 2019-08-19 DIAGNOSIS — M7989 Other specified soft tissue disorders: Secondary | ICD-10-CM | POA: Diagnosis not present

## 2019-08-19 DIAGNOSIS — Z803 Family history of malignant neoplasm of breast: Secondary | ICD-10-CM | POA: Insufficient documentation

## 2019-08-19 DIAGNOSIS — Z8249 Family history of ischemic heart disease and other diseases of the circulatory system: Secondary | ICD-10-CM | POA: Diagnosis not present

## 2019-08-19 DIAGNOSIS — C9110 Chronic lymphocytic leukemia of B-cell type not having achieved remission: Secondary | ICD-10-CM

## 2019-08-19 DIAGNOSIS — I700 Atherosclerosis of aorta: Secondary | ICD-10-CM | POA: Insufficient documentation

## 2019-08-19 DIAGNOSIS — R609 Edema, unspecified: Secondary | ICD-10-CM | POA: Diagnosis not present

## 2019-08-19 DIAGNOSIS — K769 Liver disease, unspecified: Secondary | ICD-10-CM | POA: Diagnosis not present

## 2019-08-19 LAB — CBC WITH DIFFERENTIAL (CANCER CENTER ONLY)
Abs Immature Granulocytes: 0.07 10*3/uL (ref 0.00–0.07)
Basophils Absolute: 0.1 10*3/uL (ref 0.0–0.1)
Basophils Relative: 0 %
Eosinophils Absolute: 0.1 10*3/uL (ref 0.0–0.5)
Eosinophils Relative: 0 %
HCT: 35.3 % — ABNORMAL LOW (ref 36.0–46.0)
Hemoglobin: 11.3 g/dL — ABNORMAL LOW (ref 12.0–15.0)
Immature Granulocytes: 0 %
Lymphocytes Relative: 95 %
Lymphs Abs: 71.5 10*3/uL — ABNORMAL HIGH (ref 0.7–4.0)
MCH: 31.8 pg (ref 26.0–34.0)
MCHC: 32 g/dL (ref 30.0–36.0)
MCV: 99.4 fL (ref 80.0–100.0)
Monocytes Absolute: 1.3 10*3/uL — ABNORMAL HIGH (ref 0.1–1.0)
Monocytes Relative: 2 %
Neutro Abs: 2.4 10*3/uL (ref 1.7–7.7)
Neutrophils Relative %: 3 %
Platelet Count: 196 10*3/uL (ref 150–400)
RBC: 3.55 MIL/uL — ABNORMAL LOW (ref 3.87–5.11)
RDW: 13.6 % (ref 11.5–15.5)
WBC Count: 75.5 10*3/uL (ref 4.0–10.5)
nRBC: 0 % (ref 0.0–0.2)

## 2019-08-19 LAB — CMP (CANCER CENTER ONLY)
ALT: 16 U/L (ref 0–44)
AST: 21 U/L (ref 15–41)
Albumin: 4.2 g/dL (ref 3.5–5.0)
Alkaline Phosphatase: 91 U/L (ref 38–126)
Anion gap: 6 (ref 5–15)
BUN: 14 mg/dL (ref 8–23)
CO2: 29 mmol/L (ref 22–32)
Calcium: 9 mg/dL (ref 8.9–10.3)
Chloride: 106 mmol/L (ref 98–111)
Creatinine: 0.71 mg/dL (ref 0.44–1.00)
GFR, Est AFR Am: >60 mL/min (ref >60)
GFR, Estimated: >60 mL/min (ref >60)
Glucose, Bld: 94 mg/dL (ref 70–99)
Potassium: 4.8 mmol/L (ref 3.5–5.1)
Sodium: 141 mmol/L (ref 135–145)
Total Bilirubin: 0.6 mg/dL (ref 0.3–1.2)
Total Protein: 6.1 g/dL — ABNORMAL LOW (ref 6.5–8.1)

## 2019-08-19 LAB — DIRECT ANTIGLOBULIN TEST (NOT AT ARMC)
DAT, IgG: POSITIVE
DAT, complement: NEGATIVE

## 2019-08-19 LAB — SAVE SMEAR(SSMR), FOR PROVIDER SLIDE REVIEW

## 2019-08-19 MED ORDER — CYANOCOBALAMIN 500 MCG PO TABS: 1000.0000 ug | ORAL_TABLET | Freq: Every day | ORAL | 3 refills | Status: DC

## 2019-08-19 NOTE — Progress Notes (Signed)
Richardson Landry from lab informed me of a critical lab value. WBC is 75.5. MD made aware.

## 2019-08-19 NOTE — Patient Instructions (Signed)
Chronic Lymphocytic Leukemia Chronic lymphocytic leukemia (CLL) is a type of cancer of the blood cells and soft tissue inside bones (bone marrow). CLL happens when your bone marrow makes too many abnormal white blood cells. The cells, called leukemia cells, do not function normally and accumulate in the blood. Eventually they crowd out other healthy blood cells. CLL usually gets worse slowly. It can cause complications in your organs, such as in your spleen. It can also weaken your immune system and lead to conditions in which your immune system attacks your body (autoimmune conditions). What are the causes? The cause of this condition is not known. What increases the risk? You are more likely to develop this condition if:  You are older than 50 years.  You are white.  You are female.  You have a family history of CLL or other cancers of the lymph system.  You are of [place] or [place] descent.  You have been exposed to certain chemicals, such as: ? Insecticides. ? Herbicides. These include Agent Orange, a herbicide used in the [place] war. What are the signs or symptoms? At first, there may be no symptoms. After a while, symptoms may include:  Feeling more tired than usual, even after rest.  Unplanned weight loss.  Heavy sweating at night.  Fever.  Shortness of breath.  Paleness.  Painless, swollen lymph nodes.  A feeling of fullness in the upper left part of the abdomen.  Easy bruising or bleeding.  Frequent infections. How is this diagnosed? This condition is diagnosed based on:  A physical exam to check for an enlarged spleen, liver, or lymph nodes.  Blood and bone marrow tests to check for leukemia cells. Tests may include: ? A complete blood count. ? Flow cytometry. This method uses light sensors and dyes to figure out the number of cells as well as their size, structure, and general health. ? Immunophenotyping. This method is used to  diagnose leukemia by identifying specific antibodies found in white blood cells. The test is used when a complete blood count shows the presence of immature cells or a high number of white blood cells. ? Fluorescence in situ hybridization (FISH). This test is used to examine defects in chromosomes and how those defects affect the functioning of the cell. Results from a [place] test will be used to determine treatment and assess the outcome of that treatment.  A CT scan to check for swelling or anything abnormal in your spleen, liver, and lymph nodes. How is this treated? Treatment for this condition depends on the stage of the leukemia and whether you have symptoms. Treatment may include:  Observation.  Targeted drugs. These are medicines that interfere with the way leukemia cells grow and multiply. They identify and attack specific leukemia cells without harming normal cells.  Chemotherapy drugs. These are medicines that kill leukemia cells that are multiplying quickly.  Radiation.  Surgery to remove the spleen.  Biological therapy (immunotherapy). This treatment boosts the ability of your immune system to fight the leukemia cells.  Bone marrow or peripheral blood stem cell transplant. This treatment replaces your own bone marrow or stem cells with bone marrow or stem cells from a donor. This treatment may be done after you receive very high doses of chemotherapy or radiation that kill your stem cells and bone marrow.  New treatments through clinical trials. Additional medicines may be needed to help manage symptoms. Follow these instructions at home: Medicines  Take over-the-counter and prescription medicines  only as told by your health care provider.  If you were prescribed an antibiotic medicine, take it as told by your health care provider. Do not stop taking the antibiotic even if you start to feel better. If you are on chemotherapy:  Wash your hands often, especially before meals,  after being outside, and after using the toilet. Have visitors do the same.  Keep your teeth and gums clean and well cared for. Use soft toothbrushes.  Protect your skin from the sun by using sunscreen and wearing protective clothing. General instructions  Avoid contact sports or other rough activities. Ask your health care provider what activities are safe for you.  Avoid crowded places and people who are sick.  Tell your cancer care team if you develop side effects. They may be able to recommend ways to relieve them.  Try to eat regular, healthy meals. Some of your treatments might affect your appetite.  Find healthy ways of coping with stress, such as by doing yoga or meditation or by joining a support group.  Keep all follow-up visits as told by your health care provider. This is important. Where to find more information  American Cancer Society: www.cancer.org  Leukemia and Lymphoma Society: PreviewPal.pl  National Cancer Institute ([place]): www.cancer.gov Contact a health care provider if:  You have pain in your abdomen.  You develop new bruises that are getting bigger.  You have painful or more swollen lymph nodes.  You develop bleeding from your gums or nose.  You cannot eat or drink without vomiting.  You feel lightheaded. Get help right away if:  You have a fever or chills.  You develop chest pain.  You have trouble breathing or feel short of breath.  You faint.  There is blood in your urine or stool.  You have excessive bleeding.  You have any symptoms that are severe or uncontrolled. Summary  Chronic lymphocytic leukemia (CLL) is a type of cancer of the blood cells and bone marrow.  This condition can cause an enlarged spleen, swollen lymph nodes, a weakened immune system, low red blood cell and platelets counts, and autoimmune conditions.  Treatment for this condition depends on the stage of the cancer and whether you have symptoms.  Chemotherapy,  radiation, surgery to remove the spleen, and bone marrow transplant are some of the ways to treat CLL. This information is not intended to replace advice given to you by your health care provider. Make sure you discuss any questions you have with your health care provider. Document Released: 04/13/2009 Document Revised: 03/05/2018 Document Reviewed: 11/06/2016 Elsevier Patient Education  2020 [employer].

## 2019-08-19 NOTE — Progress Notes (Signed)
Initial RN Navigator Patient Visit  Name: Sara Sanford Date of Referral :  08/12/2019 Diagnosis: Rule out CLL  Met with patient prior to their visit with MD. [name] patient "Your Patient Navigator" handout which explains my role, areas in which I am able to help, and all the contact information for myself and the office. Also gave patient MD and Navigator business card. Reviewed with patient the general overview of expected course after initial diagnosis and time frame for all steps to be completed.  New patient packet given to patient which includes: orientation to office and staff; campus directory; education on My Chart and Advance Directives; and patient centered education on CLL.   Patient completed visit with Dr. Maylon Peppers  Revisited with patient after MD visit. Patient will need  CT CAP and neck - scheduled for 08/26/2019. Contrast and instructions given to patient.   Patient understands all follow up procedures and expectations. They have my number to reach out for any further clarification or additional needs.

## 2019-08-20 LAB — LACTATE DEHYDROGENASE: LDH: 263 U/L — ABNORMAL HIGH (ref 98–192)

## 2019-08-20 LAB — IRON AND TIBC
Iron: 86 ug/dL (ref 41–142)
Saturation Ratios: 24 % (ref 21–57)
TIBC: 361 ug/dL (ref 236–444)
UIBC: 275 ug/dL (ref 120–384)

## 2019-08-20 LAB — FERRITIN: Ferritin: 18 ng/mL (ref 11–307)

## 2019-08-24 ENCOUNTER — Encounter: Payer: Self-pay | Admitting: Gastroenterology

## 2019-08-24 ENCOUNTER — Encounter: Payer: Self-pay | Admitting: *Deleted

## 2019-08-24 NOTE — Progress Notes (Signed)
Called patient to follow up after her new patient appointment.   Patient states she is doing well and just waiting to get the final determination on her diagnosis. She has her CTs scheduled for Thursday. She had a few questions related to side effects or symptoms which may possibly be related to CLL. Reviewed common symptoms. Her lack of smell is unlikely related to her CLL. She also mentioned BLE edema. I told her that her pelvic lymph nodes would be assessed during her CT and the CLL may be the cause if these LN are enlarged and restricting blood or lymphatic flow.   She is aware of all upcoming appointments. She knows to call if she has any further questions or concerns before that time.

## 2019-08-25 LAB — FLOW CYTOMETRY

## 2019-08-26 ENCOUNTER — Encounter (HOSPITAL_BASED_OUTPATIENT_CLINIC_OR_DEPARTMENT_OTHER): Payer: Self-pay

## 2019-08-26 ENCOUNTER — Ambulatory Visit (HOSPITAL_BASED_OUTPATIENT_CLINIC_OR_DEPARTMENT_OTHER)
Admission: RE | Admit: 2019-08-26 | Discharge: 2019-08-26 | Disposition: A | Discharge status: Home / Self Care | Payer: Medicare Other | Source: Ambulatory Visit | Attending: Hematology | Admitting: Hematology

## 2019-08-26 ENCOUNTER — Other Ambulatory Visit: Payer: Self-pay

## 2019-08-26 DIAGNOSIS — D72820 Lymphocytosis (symptomatic): Secondary | ICD-10-CM | POA: Diagnosis present

## 2019-08-26 DIAGNOSIS — R590 Localized enlarged lymph nodes: Secondary | ICD-10-CM | POA: Diagnosis not present

## 2019-08-26 DIAGNOSIS — I700 Atherosclerosis of aorta: Secondary | ICD-10-CM | POA: Insufficient documentation

## 2019-08-26 MED ORDER — IOHEXOL 300 MG/ML  SOLN
100.0000 mL | Freq: Once | INTRAMUSCULAR | Status: AC | PRN
  Administered 2019-08-26: 100 mL via INTRAVENOUS

## 2019-08-30 ENCOUNTER — Ambulatory Visit: Payer: BC Managed Care – PPO | Admitting: Gastroenterology

## 2019-09-07 ENCOUNTER — Inpatient Hospital Stay: Payer: Medicare Other

## 2019-09-07 ENCOUNTER — Encounter: Payer: Self-pay | Admitting: Hematology

## 2019-09-07 ENCOUNTER — Other Ambulatory Visit: Payer: Self-pay

## 2019-09-07 ENCOUNTER — Inpatient Hospital Stay (HOSPITAL_BASED_OUTPATIENT_CLINIC_OR_DEPARTMENT_OTHER): Payer: Medicare Other | Admitting: Hematology

## 2019-09-07 ENCOUNTER — Other Ambulatory Visit: Payer: Self-pay | Admitting: Hematology

## 2019-09-07 ENCOUNTER — Telehealth: Payer: Self-pay | Admitting: *Deleted

## 2019-09-07 ENCOUNTER — Telehealth: Payer: Self-pay | Admitting: Hematology

## 2019-09-07 VITALS — BP 97/77 | HR 70 | Temp 97.7°F | Resp 17 | Ht 62.0 in | Wt 252.0 lb

## 2019-09-07 DIAGNOSIS — D539 Nutritional anemia, unspecified: Secondary | ICD-10-CM

## 2019-09-07 DIAGNOSIS — M79605 Pain in left leg: Secondary | ICD-10-CM | POA: Diagnosis not present

## 2019-09-07 DIAGNOSIS — C9110 Chronic lymphocytic leukemia of B-cell type not having achieved remission: Secondary | ICD-10-CM

## 2019-09-07 DIAGNOSIS — D72820 Lymphocytosis (symptomatic): Secondary | ICD-10-CM

## 2019-09-07 HISTORY — DX: Nutritional anemia, unspecified: D53.9

## 2019-09-07 LAB — VITAMIN B12: Vitamin B-12: 399 pg/mL (ref 180–914)

## 2019-09-07 LAB — CBC WITH DIFFERENTIAL (CANCER CENTER ONLY)
Abs Immature Granulocytes: 0.06 10*3/uL (ref 0.00–0.07)
Basophils Absolute: 0.1 10*3/uL (ref 0.0–0.1)
Basophils Relative: 0 %
Eosinophils Absolute: 0.1 10*3/uL (ref 0.0–0.5)
Eosinophils Relative: 0 %
HCT: 34 % — ABNORMAL LOW (ref 36.0–46.0)
Hemoglobin: 10.9 g/dL — ABNORMAL LOW (ref 12.0–15.0)
Immature Granulocytes: 0 %
Lymphocytes Relative: 95 %
Lymphs Abs: 71.1 10*3/uL — ABNORMAL HIGH (ref 0.7–4.0)
MCH: 32.2 pg (ref 26.0–34.0)
MCHC: 32.1 g/dL (ref 30.0–36.0)
MCV: 100.3 fL — ABNORMAL HIGH (ref 80.0–100.0)
Monocytes Absolute: 1.3 10*3/uL — ABNORMAL HIGH (ref 0.1–1.0)
Monocytes Relative: 2 %
Neutro Abs: 2.3 10*3/uL (ref 1.7–7.7)
Neutrophils Relative %: 3 %
Platelet Count: 215 10*3/uL (ref 150–400)
RBC: 3.39 MIL/uL — ABNORMAL LOW (ref 3.87–5.11)
RDW: 13.7 % (ref 11.5–15.5)
WBC Count: 75 10*3/uL (ref 4.0–10.5)
nRBC: 0 % (ref 0.0–0.2)

## 2019-09-07 LAB — CMP (CANCER CENTER ONLY)
ALT: 16 U/L (ref 0–44)
AST: 22 U/L (ref 15–41)
Albumin: 4.3 g/dL (ref 3.5–5.0)
Alkaline Phosphatase: 91 U/L (ref 38–126)
Anion gap: 6 (ref 5–15)
BUN: 16 mg/dL (ref 8–23)
CO2: 29 mmol/L (ref 22–32)
Calcium: 9.1 mg/dL (ref 8.9–10.3)
Chloride: 108 mmol/L (ref 98–111)
Creatinine: 0.79 mg/dL (ref 0.44–1.00)
GFR, Est AFR Am: >60 mL/min (ref >60)
GFR, Estimated: >60 mL/min (ref >60)
Glucose, Bld: 88 mg/dL (ref 70–99)
Potassium: 4.5 mmol/L (ref 3.5–5.1)
Sodium: 143 mmol/L (ref 135–145)
Total Bilirubin: 0.5 mg/dL (ref 0.3–1.2)
Total Protein: 6.4 g/dL — ABNORMAL LOW (ref 6.5–8.1)

## 2019-09-07 LAB — SAVE SMEAR(SSMR), FOR PROVIDER SLIDE REVIEW

## 2019-09-07 NOTE — Progress Notes (Unsigned)
oo

## 2019-09-07 NOTE — Telephone Encounter (Signed)
Called and LMVM for patient regarding appointments added per 9/29 los

## 2019-09-07 NOTE — Progress Notes (Signed)
[facility] OFFICE PROGRESS NOTE  Patient Care Team: Hali Marry, MD as PCP - General (Family Medicine)  HEME/ONC OVERVIEW: 1. 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  There was questionable liver disease, but no hepatosplenomegaly.  -See the management of mild macrocytic anemia below  -Based on the imaging and lab results, the patient has Stage 0 CLL, which does not require any treatment at this time -I have ordered FISH for t(11;14) to rule out mantle cell lymphoma, which is much less likely -I discussed with the patient some of the concerning symptoms, such as unexplained persistent fever, night sweats, weight loss, worsening lymphadenopathy, or persistent unexplained abdominal pain, for which she is instructed to contact the clinic for further evaluation   Macrocytic anemia -Possibly related to CLL vs. nutritional deficiency -Hgb 11.3 today; B12 level borderline low, folate level normal; [place] study was positive for IgG, but given the normal Tbili, the hemolysis is likely very minimal  -In the absence of any significant hemolysis (such as rising Tbili) or worsening anemia, there is no indication for starting empiric high-dose steroid -Continue B12 supplement 1068mcg daily  -GI appt with Dr. Bryan Lemma scheduled on 10/15, as she has not had  colonoscopy in over 10 years  -We will monitor it for now  Left leg pain -Chronic for several months -Doppler of the LE negative for DVT in end of 07/2019 -Possibly due to sciatica; unlikely to be related to CLL  -I recommended the patient to contact her PCP regarding physical therapy referral   Orders Placed This Encounter  Procedures  . CBC with Differential (Cancer Center Only)    Standing Status:   Future    Standing Expiration Date:   10/11/2020  . CMP ([facility] only)    Standing Status:   Future    Standing Expiration Date:   10/11/2020  . Save Smear ([facility])    Standing Status:   Future    Standing Expiration Date:   09/06/2020  . Lactate dehydrogenase    Standing Status:   Future    Standing Expiration Date:   10/11/2020   All questions were answered. The patient knows to call the clinic with any problems, questions or concerns. No barriers to learning was detected.  Return in 3 months for labs and clinic follow-up.    Tish Men, MD 09/07/2019 3:05 PM  CHIEF COMPLAINT: "My left leg still bothers me"  INTERVAL HISTORY: Ms. Hitchman returns to clinic for follow-up of CLL.  Patient reports that she has had a chronic left-sided leg pain with a burning sensation for the past several months, for which she has undergone extensive work-up, including Doppler of the left lower extremity, which was negative for DVT.  She was wondering if it had anything to do with her CLL.  She otherwise feels well, and denies any constitutional symptoms.  She denies any other complaint today.  REVIEW OF SYSTEMS:   Constitutional: ( - ) fevers, ( - )  chills , ( - ) night sweats Eyes: ( - ) blurriness of vision, ( - ) double vision, ( - ) watery eyes Ears, nose, mouth, throat, and face: ( - ) mucositis, ( - ) sore throat Respiratory: ( - ) cough, ( - ) dyspnea, ( - ) wheezes Cardiovascular: ( - ) palpitation, ( - ) chest discomfort, ( - ) lower extremity swelling Gastrointestinal:  ( - ) nausea, (  - ) heartburn, ( - ) change in bowel habits Skin: ( - ) abnormal skin rashes Lymphatics: ( - ) new lymphadenopathy, ( - ) easy bruising Neurological: ( - ) numbness, ( - ) tingling, ( - ) new weaknesses Behavioral/Psych: ( - ) mood change, ( - ) new changes  All other systems were reviewed with the patient and are negative.  SUMMARY OF ONCOLOGIC HISTORY: Oncology History   No history exists.    I have reviewed the past medical history, past surgical history, social history and family history with the patient and they are unchanged from previous note.  ALLERGIES:  is allergic to propoxyphene.  MEDICATIONS:  Current Outpatient Medications  Medication Sig Dispense Refill  . clobetasol cream (TEMOVATE) 0.05 % APPLY  CREAM TWICE TO THREE TIMES A WEEK AT  NIGHT  AS  DIRECTED 60 g 0  . FLUoxetine (PROZAC) 20 MG capsule Take 1 capsule by mouth once daily 90 capsule 1  . omeprazole (PRILOSEC) 20 MG capsule Take one cap each morning 30 minutes before food.  Take one cap (before eating) each evening for one week. 37 capsule 1  . vitamin B-12 (CYANOCOBALAMIN) 500 MCG tablet Take 2 tablets (1,000 mcg total) by mouth daily. 180 tablet 3   No current facility-administered medications for this visit.     PHYSICAL EXAMINATION: ECOG PERFORMANCE STATUS: 1 - Symptomatic but completely ambulatory  Today's Vitals   09/07/19 1447  BP: 97/77  Pulse: 70  Resp: 17  Temp: 97.7 F (36.5 C)  TempSrc: Temporal  Weight: 252 lb (114.3 kg)  Height: 5\' 2"  (1.575 m)   Body mass index is 46.09 kg/m.  Filed Weights   09/07/19 1447  Weight: 252 lb (114.3 kg)    GENERAL: alert, no distress and comfortable, obese  SKIN: skin color, texture, turgor are normal, no rashes or significant lesions EYES: conjunctiva are pink and non-injected, sclera clear OROPHARYNX: no exudate, no erythema; lips, buccal mucosa, and tongue normal  NECK: supple, non-tender LUNGS: clear to auscultation with normal breathing  effort HEART: regular rate & rhythm and no murmurs and no lower extremity edema ABDOMEN: soft, non-tender, non-distended, normal bowel sounds Musculoskeletal: no cyanosis of digits and no clubbing  PSYCH: alert & oriented x 3, fluent speech NEURO: no focal motor/sensory deficits  LABORATORY DATA:  I have reviewed the data as listed    Component Value Date/Time   NA 143 09/07/2019 1341   K 4.5 09/07/2019 1341   CL 108 09/07/2019 1341   CO2 29 09/07/2019 1341   GLUCOSE 88 09/07/2019 1341   BUN 16 09/07/2019 1341   CREATININE 0.79 09/07/2019 1341   CREATININE 0.77 08/09/2019 1112   CALCIUM 9.1 09/07/2019 1341   PROT 6.4 (L) 09/07/2019 1341   ALBUMIN 4.3 09/07/2019 1341   AST 22 09/07/2019 1341   ALT 16 09/07/2019 1341   ALKPHOS 91 09/07/2019 1341   BILITOT 0.5 09/07/2019 1341  GFRNONAA >60 09/07/2019 1341   GFRNONAA 82 08/09/2019 1112   GFRAA >60 09/07/2019 1341   GFRAA 95 08/09/2019 1112    No results found for: SPEP, UPEP  Lab Results  Component Value Date   WBC 75.0 (HH) 09/07/2019   NEUTROABS 2.3 09/07/2019   HGB 10.9 (L) 09/07/2019   HCT 34.0 (L) 09/07/2019   MCV 100.3 (H) 09/07/2019   PLT 215 09/07/2019      Chemistry      Component Value Date/Time   NA 143 09/07/2019 1341   K 4.5 09/07/2019 1341   CL 108 09/07/2019 1341   CO2 29 09/07/2019 1341   BUN 16 09/07/2019 1341   CREATININE 0.79 09/07/2019 1341   CREATININE 0.77 08/09/2019 1112      Component Value Date/Time   CALCIUM 9.1 09/07/2019 1341   ALKPHOS 91 09/07/2019 1341   AST 22 09/07/2019 1341   ALT 16 09/07/2019 1341   BILITOT 0.5 09/07/2019 1341       RADIOGRAPHIC STUDIES: I have personally reviewed the radiological images as listed below and agreed with the findings in the report. Dg Chest 2 View  Result Date: 08/09/2019 CLINICAL DATA:  Shortness of breath and atypical chest pain. Bilateral lower extremity swelling. EXAM: CHEST - 2 VIEW COMPARISON:  None. FINDINGS: The heart size and  mediastinal contours are within normal limits. Both lungs are clear. The visualized skeletal structures are unremarkable. IMPRESSION: No active cardiopulmonary disease. Electronically Signed   By: Marlaine Hind M.D.   On: 08/09/2019 11:50   Ct Soft Tissue Neck W Contrast  Result Date: 08/26/2019 CLINICAL DATA:  Lymphocytosis, c/w CLL; assess lymphadenopathy. EXAM: CT NECK WITH CONTRAST TECHNIQUE: Multidetector CT imaging of the neck was performed using the standard protocol following the bolus administration of intravenous contrast. CONTRAST:  121mL OMNIPAQUE IOHEXOL 300 MG/ML  SOLN COMPARISON:  Concurrent CT chest 08/26/2019, CT paranasal sinuses 07/20/2014 FINDINGS: Pharynx and larynx: No appreciable mass or swelling. Salivary glands: No evidence of inflammation, mass or stone. Thyroid: Unremarkable Lymph nodes: Cervical chain lymph nodes are prominent in number throughout the neck. However, there are no enlarged cervical chain lymph nodes by short axis size criteria. Vascular: The major vascular structures of the neck are patent. Limited intracranial: Unremarkable Visualized orbits: No acute abnormality Mastoids and visualized paranasal sinuses: Trace right maxillary sinus mucosal thickening. No significant mastoid effusion. Skeleton: No acute bony abnormality. 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 Upper chest: Please refer to same day chest CT for description of findings below the level of the thoracic inlet. IMPRESSION: Cervical chain lymph nodes are prominent in number throughout the neck. However, no enlarged cervical chain lymph nodes by short axis size criteria are identified. Clinical correlation is recommended. Electronically Signed   By: Kellie Simmering   On: 08/26/2019 20:31   Ct Chest W Contrast  Result Date: 08/26/2019 CLINICAL DATA:  History of CLL, evaluate adenopathy. EXAM: CT CHEST WITH CONTRAST TECHNIQUE: Multidetector CT imaging of the chest, abdomen and pelvis  was performed during intravenous contrast administration. CONTRAST:  19mL OMNIPAQUE IOHEXOL 300 MG/ML  SOLN COMPARISON:  None. FINDINGS: Cardiovascular: Minimal atherosclerotic changes in the thoracic aorta. Heart size normal. No pericardial effusion. Central pulmonary arteries are normal. Mediastinum/Nodes: No hilar, mediastinal or axillary lymphadenopathy by size criteria. There are small lymph nodes in the bilateral axillary regions, most notably on the left where the largest range between 5 and 9 mm in short axis measurement. An 8 mm subcarinal lymph node  represents the largest mediastinal lymph node. Most nodes retain their fatty hila. Lungs/Pleura: Lungs are clear. Airways are patent. No signs of effusion. Hepatobiliary: Mildly lobular hepatic contours with fissural widening. Post cholecystectomy. No focal hepatic lesion. Pancreas: Within normal limits. Spleen: Within normal limits. Adrenals/Urinary Tract: Adrenal glands are normal. A small low-density lesion in the interpolar right kidney measuring 9 mm is measures 14 Hounsfield units density. No signs of hydronephrosis. No nephrolithiasis. Stomach/Bowel: Stomach, small and large bowel it has without signs of acute abnormality. Normal caliber small bowel and colon. Vascular/Lymphatic: Unremarkable appearance of the abdominal aorta and iliac vasculature. Borderline enlarged left periaortic chain lymph node measures 1 cm. Scattered lymph nodes in the pelvis none displaying pathologic enlargement. Reproductive organs: Is uterus remains in situ, unremarkable by CT. Urinary bladder unremarkable by CT. Other: Body wall is normal. Musculoskeletal: No focal destructive bone lesion. 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 IMPRESSION: 1. No signs of adenopathy in the chest, abdomen or pelvis. Borderline enlarged node in the abdomen could be followed at 6 months given clinical concern for CLL. 2. Lobular hepatic contour is with fissural  widening raising the question of liver disease but without signs of portal hypertension, consider clinical and laboratory correlation for evidence of liver disease as indicated. Aortic Atherosclerosis (ICD10-I70.0). Electronically Signed   By: Zetta Bills M.D.   On: 08/26/2019 17:02   Ct Abdomen Pelvis W Contrast  Result Date: 08/26/2019 CLINICAL DATA:  History of CLL, evaluate adenopathy. EXAM: CT CHEST WITH CONTRAST TECHNIQUE: Multidetector CT imaging of the chest, abdomen and pelvis was performed during intravenous contrast administration. CONTRAST:  141mL OMNIPAQUE IOHEXOL 300 MG/ML  SOLN COMPARISON:  None. FINDINGS: Cardiovascular: Minimal atherosclerotic changes in the thoracic aorta. Heart size normal. No pericardial effusion. Central pulmonary arteries are normal. Mediastinum/Nodes: No hilar, mediastinal or axillary lymphadenopathy by size criteria. There are small lymph nodes in the bilateral axillary regions, most notably on the left where the largest range between 5 and 9 mm in short axis measurement. An 8 mm subcarinal lymph node represents the largest mediastinal lymph node. Most nodes retain their fatty hila. Lungs/Pleura: Lungs are clear. Airways are patent. No signs of effusion. Hepatobiliary: Mildly lobular hepatic contours with fissural widening. Post cholecystectomy. No focal hepatic lesion. Pancreas: Within normal limits. Spleen: Within normal limits. Adrenals/Urinary Tract: Adrenal glands are normal. A small low-density lesion in the interpolar right kidney measuring 9 mm is measures 14 Hounsfield units density. No signs of hydronephrosis. No nephrolithiasis. Stomach/Bowel: Stomach, small and large bowel it has without signs of acute abnormality. Normal caliber small bowel and colon. Vascular/Lymphatic: Unremarkable appearance of the abdominal aorta and iliac vasculature. Borderline enlarged left periaortic chain lymph node measures 1 cm. Scattered lymph nodes in the pelvis none  displaying pathologic enlargement. Reproductive organs: Is uterus remains in situ, unremarkable by CT. Urinary bladder unremarkable by CT. Other: Body wall is normal. Musculoskeletal: No focal destructive bone lesion. 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 IMPRESSION: 1. No signs of adenopathy in the chest, abdomen or pelvis. Borderline enlarged node in the abdomen could be followed at 6 months given clinical concern for CLL. 2. Lobular hepatic contour is with fissural widening raising the question of liver disease but without signs of portal hypertension, consider clinical and laboratory correlation for evidence of liver disease as indicated. Aortic Atherosclerosis (ICD10-I70.0). Electronically Signed   By: Zetta Bills M.D.   On: 08/26/2019 17:02   US Venous Img Lower  Unilateral Left  Result Date: 08/09/2019 CLINICAL DATA:  Left lower extremity pain and edema. Shortness of breath for the past 2 weeks. Evaluate for DVT. EXAM: LEFT LOWER EXTREMITY VENOUS DOPPLER ULTRASOUND TECHNIQUE: Gray-scale sonography with graded compression, as well as color Doppler and duplex ultrasound were performed to evaluate the lower extremity deep venous systems from the level of the common femoral vein and including the common femoral, femoral, profunda femoral, popliteal and calf veins including the posterior tibial, peroneal and gastrocnemius veins when visible. The superficial great saphenous vein was also interrogated. Spectral Doppler was utilized to evaluate flow at rest and with distal augmentation maneuvers in the common femoral, femoral and popliteal veins. COMPARISON:  None. FINDINGS: Examination is degraded due to patient body habitus and poor sonographic window. Contralateral Common Femoral Vein: Respiratory phasicity is normal and symmetric with the symptomatic side. No evidence of thrombus. Normal compressibility. Common Femoral Vein: No evidence of thrombus. Normal compressibility,  respiratory phasicity and response to augmentation. Saphenofemoral Junction: No evidence of thrombus. Normal compressibility and flow on color Doppler imaging. Profunda Femoral Vein: No evidence of thrombus. Normal compressibility and flow on color Doppler imaging. Femoral Vein: No evidence of thrombus. Normal compressibility, respiratory phasicity and response to augmentation. Popliteal Vein: No evidence of thrombus. Normal compressibility, respiratory phasicity and response to augmentation. Calf Veins: No evidence of thrombus. Normal compressibility and flow on color Doppler imaging. Superficial Great Saphenous Vein: No evidence of thrombus. Normal compressibility. Venous Reflux:  None. Other Findings:  None. IMPRESSION: No evidence of DVT within the left lower extremity. Electronically Signed   By: Sandi Mariscal M.D.   On: 08/09/2019 12:14

## 2019-09-07 NOTE — Telephone Encounter (Signed)
Dr. Maylon Peppers notified of WBC-75.  No new orders received at this time.

## 2019-09-07 NOTE — Patient Instructions (Signed)
Chronic Lymphocytic Leukemia Chronic lymphocytic leukemia (CLL) is a type of cancer of the blood cells and soft tissue inside bones (bone marrow). CLL happens when your bone marrow makes too many abnormal white blood cells. The cells, called leukemia cells, do not function normally and accumulate in the blood. Eventually they crowd out other healthy blood cells. CLL usually gets worse slowly. It can cause complications in your organs, such as in your spleen. It can also weaken your immune system and lead to conditions in which your immune system attacks your body (autoimmune conditions). What are the causes? The cause of this condition is not known. What increases the risk? You are more likely to develop this condition if:  You are older than 50 years.  You are white.  You are female.  You have a family history of CLL or other cancers of the lymph system.  You are of [place] or [place] descent.  You have been exposed to certain chemicals, such as: ? Insecticides. ? Herbicides. These include Agent Orange, a herbicide used in the [place] war. What are the signs or symptoms? At first, there may be no symptoms. After a while, symptoms may include:  Feeling more tired than usual, even after rest.  Unplanned weight loss.  Heavy sweating at night.  Fever.  Shortness of breath.  Paleness.  Painless, swollen lymph nodes.  A feeling of fullness in the upper left part of the abdomen.  Easy bruising or bleeding.  Frequent infections. How is this diagnosed? This condition is diagnosed based on:  A physical exam to check for an enlarged spleen, liver, or lymph nodes.  Blood and bone marrow tests to check for leukemia cells. Tests may include: ? A complete blood count. ? Flow cytometry. This method uses light sensors and dyes to figure out the number of cells as well as their size, structure, and general health. ? Immunophenotyping. This method is used to  diagnose leukemia by identifying specific antibodies found in white blood cells. The test is used when a complete blood count shows the presence of immature cells or a high number of white blood cells. ? Fluorescence in situ hybridization (FISH). This test is used to examine defects in chromosomes and how those defects affect the functioning of the cell. Results from a [place] test will be used to determine treatment and assess the outcome of that treatment.  A CT scan to check for swelling or anything abnormal in your spleen, liver, and lymph nodes. How is this treated? Treatment for this condition depends on the stage of the leukemia and whether you have symptoms. Treatment may include:  Observation.  Targeted drugs. These are medicines that interfere with the way leukemia cells grow and multiply. They identify and attack specific leukemia cells without harming normal cells.  Chemotherapy drugs. These are medicines that kill leukemia cells that are multiplying quickly.  Radiation.  Surgery to remove the spleen.  Biological therapy (immunotherapy). This treatment boosts the ability of your immune system to fight the leukemia cells.  Bone marrow or peripheral blood stem cell transplant. This treatment replaces your own bone marrow or stem cells with bone marrow or stem cells from a donor. This treatment may be done after you receive very high doses of chemotherapy or radiation that kill your stem cells and bone marrow.  New treatments through clinical trials. Additional medicines may be needed to help manage symptoms. Follow these instructions at home: Medicines  Take over-the-counter and prescription medicines  only as told by your health care provider.  If you were prescribed an antibiotic medicine, take it as told by your health care provider. Do not stop taking the antibiotic even if you start to feel better. If you are on chemotherapy:  Wash your hands often, especially before meals,  after being outside, and after using the toilet. Have visitors do the same.  Keep your teeth and gums clean and well cared for. Use soft toothbrushes.  Protect your skin from the sun by using sunscreen and wearing protective clothing. General instructions  Avoid contact sports or other rough activities. Ask your health care provider what activities are safe for you.  Avoid crowded places and people who are sick.  Tell your cancer care team if you develop side effects. They may be able to recommend ways to relieve them.  Try to eat regular, healthy meals. Some of your treatments might affect your appetite.  Find healthy ways of coping with stress, such as by doing yoga or meditation or by joining a support group.  Keep all follow-up visits as told by your health care provider. This is important. Where to find more information  American Cancer Society: www.cancer.org  Leukemia and Lymphoma Society: PreviewPal.pl  National Cancer Institute ([place]): www.cancer.gov Contact a health care provider if:  You have pain in your abdomen.  You develop new bruises that are getting bigger.  You have painful or more swollen lymph nodes.  You develop bleeding from your gums or nose.  You cannot eat or drink without vomiting.  You feel lightheaded. Get help right away if:  You have a fever or chills.  You develop chest pain.  You have trouble breathing or feel short of breath.  You faint.  There is blood in your urine or stool.  You have excessive bleeding.  You have any symptoms that are severe or uncontrolled. Summary  Chronic lymphocytic leukemia (CLL) is a type of cancer of the blood cells and bone marrow.  This condition can cause an enlarged spleen, swollen lymph nodes, a weakened immune system, low red blood cell and platelets counts, and autoimmune conditions.  Treatment for this condition depends on the stage of the cancer and whether you have symptoms.  Chemotherapy,  radiation, surgery to remove the spleen, and bone marrow transplant are some of the ways to treat CLL. This information is not intended to replace advice given to you by your health care provider. Make sure you discuss any questions you have with your health care provider. Document Released: 04/13/2009 Document Revised: 03/05/2018 Document Reviewed: 11/06/2016 Elsevier Patient Education  2020 [employer].

## 2019-09-08 LAB — LACTATE DEHYDROGENASE: LDH: 304 U/L — ABNORMAL HIGH (ref 98–192)

## 2019-09-08 LAB — DIRECT ANTIGLOBULIN TEST (NOT AT ARMC)
DAT, IgG: POSITIVE
DAT, complement: NEGATIVE

## 2019-09-08 LAB — BETA 2 MICROGLOBULIN, SERUM: Beta-2 Microglobulin: 2.3 mg/L (ref 0.6–2.4)

## 2019-09-09 ENCOUNTER — Other Ambulatory Visit: Payer: Self-pay | Admitting: Family Medicine

## 2019-09-09 ENCOUNTER — Ambulatory Visit (INDEPENDENT_AMBULATORY_CARE_PROVIDER_SITE_OTHER): Payer: Medicare Other | Admitting: Family Medicine

## 2019-09-09 ENCOUNTER — Other Ambulatory Visit: Payer: Self-pay

## 2019-09-09 ENCOUNTER — Encounter: Payer: Self-pay | Admitting: Family Medicine

## 2019-09-09 VITALS — BP 134/51 | HR 81 | Ht 62.0 in | Wt 250.0 lb

## 2019-09-09 DIAGNOSIS — N390 Urinary tract infection, site not specified: Secondary | ICD-10-CM

## 2019-09-09 DIAGNOSIS — M25562 Pain in left knee: Secondary | ICD-10-CM

## 2019-09-09 DIAGNOSIS — M79605 Pain in left leg: Secondary | ICD-10-CM | POA: Insufficient documentation

## 2019-09-09 DIAGNOSIS — M1712 Unilateral primary osteoarthritis, left knee: Secondary | ICD-10-CM

## 2019-09-09 DIAGNOSIS — C9110 Chronic lymphocytic leukemia of B-cell type not having achieved remission: Secondary | ICD-10-CM

## 2019-09-09 DIAGNOSIS — G8929 Other chronic pain: Secondary | ICD-10-CM

## 2019-09-09 HISTORY — DX: Unilateral primary osteoarthritis, left knee: M17.12

## 2019-09-09 HISTORY — DX: Pain in left leg: M79.605

## 2019-09-09 LAB — POCT URINALYSIS DIPSTICK
Bilirubin, UA: NEGATIVE
Glucose, UA: NEGATIVE
Ketones, UA: NEGATIVE
Leukocytes, UA: NEGATIVE
Nitrite, UA: POSITIVE
Protein, UA: NEGATIVE
Spec Grav, UA: >=1.03 — AB (ref 1.010–1.025)
Urobilinogen, UA: 0.2 E.U./dL
pH, UA: 5.5 (ref 5.0–8.0)

## 2019-09-09 MED ORDER — NITROFURANTOIN MONOHYD MACRO 100 MG PO CAPS: 100.0000 mg | ORAL_CAPSULE | Freq: Two times a day (BID) | ORAL | 0 refills | Status: DC

## 2019-09-09 NOTE — Assessment & Plan Note (Signed)
Following with oncology. They are monitoring her for now.

## 2019-09-09 NOTE — Assessment & Plan Note (Signed)
Recommend f/u with Dr. Georgina Snell, might benefit from formal PT.  Might benefit from synvisc injections, etc. She is not interested in surgery at this point but did encouraged her to see Dr. Georgina Snell to discuss options.

## 2019-09-09 NOTE — Progress Notes (Signed)
Established Patient Office Visit  Subjective:  Patient ID: Sara Sanford, female    DOB: Mar 12, 1956  Age: 63 y.o. MRN: [id number]  CC:  Chief Complaint  Patient presents with  . Urinary Tract Infection    sxs began tuesday she took AZO for 1 day.back pain,some nausea    HPI Sara Sanford presents for dysuria and pelvic pain that started 2 days ago.  She had an e coli UTI in July.  She has had some recurrent UTIs.  She was on prophylaxis but stopped it around the time that she got diagnosed with CLL.  She is not had any fevers chills or sweats but has had a little bit of low back pain.  May be some nausea though she says sometimes she just randomly gets nauseated.  He says it actually feels a little bit better today in regards to the dysuria.  No blood in the urine.  She is still struggling with pain mostly in her left leg.  She says she gets a burning sensation that goes from her left thigh down her leg all the way to her foot at times.  That is more new and maybe has started in the last month or so.  But she is also getting a lot of pain in that left knee and sometimes will feel like she gets a catch most in the lateral portion of the knee Periods to the point where sometimes she has almost fallen because of it.  She actually saw Dr. Georgina Snell back in June and he did x-rays which indicated some severe medial compartment arthritis and did an injection.  He also gave her some exercises to do.  She says she did get relief for about 3 weeks.  She was not having the burning sensation down her entire leg at that time it was mostly just the knee that was bothering her.  When I last saw her we did blood work and it revealed a white blood cell count of 75,000.  She was referred to oncology immediately and was diagnosed with chronic lymphocytic leukemia.  Right now they are just monitoring her.  She does have a colonoscopy appointment coming up on October 15.  Last colonoscopy was probably over 10  years ago.  Past Medical History:  Diagnosis Date  . Heart burn    peptic ulcer  . Lichen sclerosus   . Obesity   . Post-menopausal     Past Surgical History:  Procedure Laterality Date  . CARDIAC CATHETERIZATION  2008  . DILATION AND CURETTAGE OF UTERUS    . HYSTEROSCOPY W/D&C  2008  . KIDNEY STONE SURGERY  1977    Family History  Problem Relation Age of Onset  . Breast cancer Paternal Aunt        pm breast CA  . Breast cancer Paternal Aunt        PM breast CA  . Heart attack Father   . Hypertension Father   . Hypertension Mother     Social History   Socioeconomic History  . Marital status: Married    Spouse name: Not on file  . Number of children: 2  . Years of education: Not on file  . Highest education level: Not on file  Occupational History  . Occupation: Cytogeneticist: [employer]  Social Needs  . Financial resource strain: Not on file  . Food insecurity    Worry: Not on file    Inability: Not on file  .  Transportation needs    Medical: Not on file    Non-medical: Not on file  Tobacco Use  . Smoking status: Never Smoker  . Smokeless tobacco: Never Used  Substance and Sexual Activity  . Alcohol use: No  . Drug use: No  . Sexual activity: Yes    Partners: Male  Lifestyle  . Physical activity    Days per week: Not on file    Minutes per session: Not on file  . Stress: Not on file  Relationships  . Social Herbalist on phone: Not on file    Gets together: Not on file    Attends religious service: Not on file    Active member of club or organization: Not on file    Attends meetings of clubs or organizations: Not on file    Relationship status: Not on file  . Intimate partner violence    Fear of current or ex partner: Not on file    Emotionally abused: Not on file    Physically abused: Not on file    Forced sexual activity: Not on file  Other Topics Concern  . Not on file  Social History Narrative   2 mile walk 4 times a  week.      Outpatient Medications Prior to Visit  Medication Sig Dispense Refill  . clobetasol cream (TEMOVATE) 0.05 % APPLY  CREAM TWICE TO THREE TIMES A WEEK AT  NIGHT  AS  DIRECTED 60 g 0  . FLUoxetine (PROZAC) 20 MG capsule Take 1 capsule by mouth once daily 90 capsule 0  . omeprazole (PRILOSEC) 20 MG capsule Take one cap each morning 30 minutes before food.  Take one cap (before eating) each evening for one week. 37 capsule 1  . vitamin B-12 (CYANOCOBALAMIN) 500 MCG tablet Take 2 tablets (1,000 mcg total) by mouth daily. 180 tablet 3   No facility-administered medications prior to visit.     Allergies  Allergen Reactions  . Propoxyphene Anaphylaxis, Shortness Of Breath and Other (See Comments)    Hallucinations     ROS Review of Systems    Objective:    Physical Exam  Constitutional: She is oriented to person, place, and time. She appears well-developed and well-nourished.  HENT:  Head: Normocephalic and atraumatic.  Cardiovascular: Normal rate, regular rhythm and normal heart sounds.  Pulmonary/Chest: Effort normal and breath sounds normal.  Musculoskeletal:     Comments: No CVA tenderness.  Neurological: She is alert and oriented to person, place, and time.  Skin: Skin is warm and dry.  Psychiatric: She has a normal mood and affect. Her behavior is normal.    BP (!) 134/51   Pulse 81   Ht 5\' 2"  (1.575 m)   Wt 250 lb (113.4 kg)   SpO2 98%   BMI 45.73 kg/m  Wt Readings from Last 3 Encounters:  09/09/19 250 lb (113.4 kg)  09/07/19 252 lb (114.3 kg)  08/19/19 249 lb (112.9 kg)     Health Maintenance Due  Topic Date Due  . DEXA SCAN  08/19/2019  . PNA vac Low Risk Adult (1 of 2 - PCV13) 08/19/2019    There are no preventive care reminders to display for this patient.  Lab Results  Component Value Date   TSH 1.98 08/09/2019   Lab Results  Component Value Date   WBC 75.0 (HH) 09/07/2019   HGB 10.9 (L) 09/07/2019   HCT 34.0 (L) 09/07/2019   MCV  100.3 (H) 09/07/2019  PLT 215 09/07/2019   Lab Results  Component Value Date   NA 143 09/07/2019   K 4.5 09/07/2019   CO2 29 09/07/2019   GLUCOSE 88 09/07/2019   BUN 16 09/07/2019   CREATININE 0.79 09/07/2019   BILITOT 0.5 09/07/2019   ALKPHOS 91 09/07/2019   AST 22 09/07/2019   ALT 16 09/07/2019   PROT 6.4 (L) 09/07/2019   ALBUMIN 4.3 09/07/2019   CALCIUM 9.1 09/07/2019   ANIONGAP 6 09/07/2019   Lab Results  Component Value Date   CHOL 218 (H) 08/12/2018   Lab Results  Component Value Date   HDL 47 (L) 08/12/2018   Lab Results  Component Value Date   LDLCALC 151 (H) 08/12/2018   Lab Results  Component Value Date   TRIG 93 08/12/2018   Lab Results  Component Value Date   CHOLHDL 4.6 08/12/2018   Lab Results  Component Value Date   HGBA1C 5.4 06/30/2019      Assessment & Plan:   Problem List Items Addressed This Visit      Musculoskeletal and Integument   Primary osteoarthritis of left knee    Recommend f/u with Dr. Georgina Snell, might benefit from formal PT.  Might benefit from synvisc injections, etc. She is not interested in surgery at this point but did encouraged her to see Dr. Georgina Snell to discuss options.        Genitourinary   Recurrent UTI - Primary   Relevant Medications   nitrofurantoin, macrocrystal-monohydrate, (MACROBID) 100 MG capsule   Other Relevant Orders   POCT urinalysis dipstick (Completed)   Urine Culture     Other   Left leg pain   CLL (chronic lymphocytic leukemia) ([place])    Following with oncology. They are monitoring her for now.        Other Visit Diagnoses    Chronic pain of left knee          UTI-we will treat with Macrobid based on last culture from July.  Did repeat urine culture today we will call her with results once available.  Make sure hydrating well.  Okay to restart prophylaxis when she completes the Macrobid.  Meds ordered this encounter  Medications  . nitrofurantoin, macrocrystal-monohydrate, (MACROBID) 100  MG capsule    Sig: Take 1 capsule (100 mg total) by mouth 2 (two) times daily.    Dispense:  10 capsule    Refill:  0    Follow-up: Return if symptoms worsen or fail to improve.    Beatrice Lecher, MD

## 2019-09-10 LAB — URINE CULTURE
MICRO NUMBER:: 945378
SPECIMEN QUALITY:: ADEQUATE

## 2019-09-11 LAB — METHYLMALONIC ACID, SERUM: Methylmalonic Acid, Quantitative: 217 nmol/L (ref 0–378)

## 2019-09-13 ENCOUNTER — Encounter: Payer: Self-pay | Admitting: Hematology

## 2019-09-14 ENCOUNTER — Ambulatory Visit: Payer: BC Managed Care – PPO | Admitting: Family Medicine

## 2019-09-14 LAB — FISH,CLL PROGNOSTIC PANEL

## 2019-09-14 LAB — GENARRAY MOLECULAR KARYOTYPING FOR CLL

## 2019-09-17 ENCOUNTER — Telehealth: Payer: Self-pay

## 2019-09-17 DIAGNOSIS — N390 Urinary tract infection, site not specified: Secondary | ICD-10-CM

## 2019-09-17 LAB — LYMPHOMA-MANTLE CELL

## 2019-09-17 MED ORDER — NITROFURANTOIN MONOHYD MACRO 100 MG PO CAPS: 100.0000 mg | ORAL_CAPSULE | Freq: Two times a day (BID) | ORAL | 0 refills | Status: DC

## 2019-09-17 NOTE — Telephone Encounter (Signed)
I refilled antibiotics but please instruct patient to provide urine specimen for culture BEFORE starting any antibiotic medications.  Given symptoms and most recent negative culture, this may also be due to non-UTI causes of urethral irritation particularly in postmenopausal women, and given quick recurrence of symptoms I would probably recommend following up in office with PCP to consider pelvic exam or other evaluation

## 2019-09-17 NOTE — Telephone Encounter (Signed)
Left message advising of recommendations and for a return call.

## 2019-09-17 NOTE — Telephone Encounter (Signed)
Sara Sanford states the burning with urination, frequent urination a pressure has returned. I did order a urine culture as per result note. She is worried about going through the weekend without another antibiotic. Please advise.

## 2019-09-19 LAB — URINE CULTURE
MICRO NUMBER:: 974779
SPECIMEN QUALITY:: ADEQUATE

## 2019-09-21 NOTE — Telephone Encounter (Signed)
See result notes. 

## 2019-09-23 ENCOUNTER — Ambulatory Visit (INDEPENDENT_AMBULATORY_CARE_PROVIDER_SITE_OTHER): Payer: Medicare Other | Admitting: Gastroenterology

## 2019-09-23 ENCOUNTER — Telehealth: Payer: Self-pay | Admitting: Gastroenterology

## 2019-09-23 ENCOUNTER — Encounter: Payer: Self-pay | Admitting: Gastroenterology

## 2019-09-23 ENCOUNTER — Telehealth: Payer: Self-pay

## 2019-09-23 ENCOUNTER — Other Ambulatory Visit: Payer: Self-pay

## 2019-09-23 VITALS — BP 126/76 | HR 81 | Temp 98.6°F | Ht 62.0 in | Wt 253.0 lb

## 2019-09-23 DIAGNOSIS — Z1211 Encounter for screening for malignant neoplasm of colon: Secondary | ICD-10-CM | POA: Diagnosis not present

## 2019-09-23 DIAGNOSIS — Z1212 Encounter for screening for malignant neoplasm of rectum: Secondary | ICD-10-CM

## 2019-09-23 DIAGNOSIS — E538 Deficiency of other specified B group vitamins: Secondary | ICD-10-CM | POA: Diagnosis not present

## 2019-09-23 DIAGNOSIS — D649 Anemia, unspecified: Secondary | ICD-10-CM | POA: Diagnosis not present

## 2019-09-23 DIAGNOSIS — K219 Gastro-esophageal reflux disease without esophagitis: Secondary | ICD-10-CM

## 2019-09-23 DIAGNOSIS — Z1159 Encounter for screening for other viral diseases: Secondary | ICD-10-CM

## 2019-09-23 DIAGNOSIS — R1310 Dysphagia, unspecified: Secondary | ICD-10-CM

## 2019-09-23 MED ORDER — NA SULFATE-K SULFATE-MG SULF 17.5-3.13-1.6 GM/177ML PO SOLN: 1.0000 | Freq: Once | ORAL | 0 refills | Status: AC

## 2019-09-23 MED ORDER — SUPREP BOWEL PREP KIT 17.5-3.13-1.6 GM/177ML PO SOLN: 1.0000 | ORAL | 0 refills | Status: DC

## 2019-09-23 NOTE — Telephone Encounter (Signed)
We will need to get Orthovisc approved, I will route this message to Barnet Pall as well to work on approval.  She will then call the patient to schedule when it is approved.

## 2019-09-23 NOTE — Progress Notes (Signed)
Chief Complaint: CRC screening, anemia   Referring Provider:     Tish Men, MD   HPI:     Sara Sanford is a 63 y.o. female with a history of stage 0 CLL, referred to the [facility] for evaluation of mild microcytic anemia along with routine CRC screen.  Hemoglobin ~11 with MCV in the low 100s for the last few months (hemoglobin 13 with MCV 93 in 2015).  Normal PLT.  Normal CMP (protein 6.4).  Ferritin 18, iron 86, TIBC 361, sat 24%.  B12 borderline low at 399, and started on B12 supplement last month.   Normal folate.  She denies any hematochezia, melena, abdominal pain, n/v/d/c/f/c.   Has a hx of reflux, characterized by HB, regurgitation. Worse at night. Controlled with Prilosec 20 mg/day for years. Had trialed Zantac briefly with no relief, then resumed Prilosec. Intermittent dysphagia with cold and carbonated beverages for years. No odynophagia. No issue with warm beverages.   Recent CT with incidental finding of mildly lobular hepatic contour is with fissural widening without lesions.  No duct dilatation.  No portal hypertension.  Otherwise normal GI tract.  MGM with Stomach CA. No Fhx of CRC, IBD.  Endoscopic history: -Colonoscopy 2010- normal per patient - EGD 2005- normal per patient - Colonoscopy 20005- normal per patient   Past Medical History:  Diagnosis Date  . Anxiety   . Chronic lymphocytic leukemia ([place])   . Gallstones   . Heart burn    peptic ulcer  . Kidney stones   . Lichen sclerosus   . Obesity   . Post-menopausal   . UTI (urinary tract infection)      Past Surgical History:  Procedure Laterality Date  . CARDIAC CATHETERIZATION  2008  . COLONOSCOPY  2010   [place], [place]  . DILATION AND CURETTAGE OF UTERUS    . ESOPHAGOGASTRODUODENOSCOPY  2005   [place], [place] had egd and colon at the same time   . GALLBLADDER SURGERY  2015  . HYSTEROSCOPY W/D&C  2008  . KIDNEY STONE SURGERY  1977   Family History  Problem  Relation Age of Onset  . Breast cancer Paternal Aunt        pm breast CA  . Breast cancer Paternal Aunt        PM breast CA  . Heart attack Father   . Hypertension Father   . Colon polyps Father   . Hypertension Mother   . Stomach cancer Maternal Grandmother   . Colon cancer Neg Hx   . Esophageal cancer Neg Hx    Social History   Tobacco Use  . Smoking status: Never Smoker  . Smokeless tobacco: Never Used  Substance Use Topics  . Alcohol use: No  . Drug use: No   Current Outpatient Medications  Medication Sig Dispense Refill  . clobetasol cream (TEMOVATE) 0.05 % APPLY  CREAM TWICE TO THREE TIMES A WEEK AT  NIGHT  AS  DIRECTED 60 g 0  . FLUoxetine (PROZAC) 20 MG capsule Take 1 capsule by mouth once daily 90 capsule 0  . omeprazole (PRILOSEC) 20 MG capsule Take one cap each morning 30 minutes before food.  Take one cap (before eating) each evening for one week. 37 capsule 1  . vitamin B-12 (CYANOCOBALAMIN) 1000 MCG tablet Take 1,000 mcg by mouth daily.     No current facility-administered medications for this visit.    Allergies  Allergen  Reactions  . Propoxyphene Anaphylaxis, Shortness Of Breath and Other (See Comments)    Hallucinations      Review of Systems: All systems reviewed and negative except where noted in HPI.     Physical Exam:    Wt Readings from Last 3 Encounters:  09/23/19 253 lb (114.8 kg)  09/09/19 250 lb (113.4 kg)  09/07/19 252 lb (114.3 kg)    BP 126/76   Pulse 81   Temp 98.6 F (37 C)   Ht 5\' 2"  (1.575 m)   Wt 253 lb (114.8 kg)   BMI 46.27 kg/m  Constitutional:  Pleasant, in no acute distress. Psychiatric: Normal mood and affect. Behavior is normal. EENT: Pupils normal.  Conjunctivae are normal. No scleral icterus. Neck supple. No cervical LAD. Cardiovascular: Normal rate, regular rhythm. No edema Pulmonary/chest: Effort normal and breath sounds normal. No wheezing, rales or rhonchi. Abdominal: Soft, nondistended, nontender. Bowel  sounds active throughout. There are no masses palpable. No hepatomegaly. Neurological: Alert and oriented to person place and time. Skin: Skin is warm and dry. No rashes noted.   ASSESSMENT AND PLAN;   1) CRC Screening: -Due for age-appropriate, average risk CRC screening -Schedule colonoscopy  2) Dysphagia: -Intermittent dysphagia, worst with cold and carbonated beverages.  Discussed multiple etiologies.  In addition to the common etiologies, need to entertain the possibility of DES given clinical description -EGD with dilation +/-esophageal biopsies -If EGD unrevealing, can consider esophageal manometry  3) GERD: -Well-controlled on current acid suppression therapy -Evaluate for erosive esophagitis, LES laxity, hiatal hernia at time of EGD as above  4) Macrocytic anemia - Mild B12 and iron insufficiency (low ferritin, otherwise normal indices) -Evaluate for mucosal/luminal etiology at time of EGD/colonoscopy as above -Small bowel biopsies -Already started on B12 supplement  The indications, risks, and benefits of EGD and colonoscopy were explained to the patient in detail. Risks include but are not limited to bleeding, perforation, adverse reaction to medications, and cardiopulmonary compromise. Sequelae include but are not limited to the possibility of surgery, hositalization, and mortality. The patient verbalized understanding and wished to proceed. All questions answered, referred to scheduler and bowel prep ordered. Further recommendations pending results of the exam.      Lavena Bullion, DO, FACG  09/23/2019, 9:26 AM   Tish Men, MD

## 2019-09-23 NOTE — Telephone Encounter (Signed)
Sara Sanford's husband called and would like his wife to start the knee injections. She was seen by Dr Georgina Snell but will need to switch to Dr Dianah Field. Her husband would prefer that she get scheduled for the injections. I did explain she may have to have an initial visit with Dr Dianah Field first.   [phone]

## 2019-09-23 NOTE — Telephone Encounter (Signed)
Notified patient of prep being sent to pharmacy with corrected coding for coverage.

## 2019-09-23 NOTE — Telephone Encounter (Signed)
Pt called to inform that her pharmacy told her that Medicare is saying that code for prep is wrong. Pharmacy advised pt to contact us.

## 2019-09-23 NOTE — Telephone Encounter (Signed)
Patient spouse is aware that this can take 3 weeks or longer depending on how quick insurance processes the claim.

## 2019-09-23 NOTE — Patient Instructions (Addendum)
If you are age 65 or older, your body mass index should be between 23-30. Your Body mass index is 46.27 kg/m. If this is out of the aforementioned range listed, please consider follow up with your Primary Care Provider.  If you are age 82 or younger, your body mass index should be between 19-25. Your Body mass index is 46.27 kg/m. If this is out of the aformentioned range listed, please consider follow up with your Primary Care Provider.   To help prevent the possible spread of infection to our patients, communities, and staff; we will be implementing the following measures:  As of now we are not allowing any visitors/family members to accompany you to any upcoming appointments with [facility]. If you have any concerns about this please contact our office to discuss prior to the appointment.   You have been scheduled for an endoscopy and colonoscopy. Please follow the written instructions given to you at your visit today. Please pick up your prep supplies at the pharmacy within the next 1-3 days. If you use inhalers (even only as needed), please bring them with you on the day of your procedure. Your physician has requested that you go to www.startemmi.com and enter the access code given to you at your visit today. This web site gives a general overview about your procedure. However, you should still follow specific instructions given to you by our office regarding your preparation for the procedure.  We have sent the following medications to your pharmacy for you to pick up at your convenience: Suprep  Due to recent COVID-19 restrictions implemented by [employer] and state authorities and in an effort to keep both patients and staff as safe as possible, [facility] requires COVID-19 testing prior to any scheduled endoscopic procedure. The testing center is located at [address] [place],  [address] in the [facility]  suite.  Your appointment has been scheduled for 1:00pm on 10/25/2019.   Please bring your insurance cards to this appointment. You will require your COVID screen 2 business days prior to your endoscopic procedure.  You are not required to quarantine after your screening.  You will only receive a phone call with the results if it is POSITIVE.  If you do not receive a call the day before your procedure you should begin your prep, if ordered, and you should report to the endo center for your procedure at your designated appointment arrival time ( one hour prior to the procedure time). There is no cost to you for the screening on the day of the swab.  The [facility] will file with your insurance company for the testing.    You may receive an automated phone call prior to your procedure or have a message in your MyChart that you have an appointment for a BP/15 at the [facility], please disregard this message.  Your testing will be at the [address] , [place] [facility] location.  It was a pleasure to see you today!  Vito Cirigliano, D.O.

## 2019-09-23 NOTE — Telephone Encounter (Signed)
I have to call her insurance and get an approval for knee injections and they have to fax a form to the office to be filled out by the provider per Roda Shutters with [facility].

## 2019-09-27 ENCOUNTER — Telehealth: Payer: Self-pay | Admitting: Gastroenterology

## 2019-09-28 MED ORDER — SYNVISC 16 MG/2ML IX SOSY: PREFILLED_SYRINGE | INTRA_ARTICULAR | 3 refills | Status: DC

## 2019-09-28 NOTE — Telephone Encounter (Signed)
Patient's husband Legrand Como came by [facility] office to pick up Suprep today for patient.

## 2019-09-28 NOTE — Telephone Encounter (Signed)
I received an approval for the Synvisc injections. The patients policy would not cover Orthovisc. Can you please send to [facility].

## 2019-09-28 NOTE — Telephone Encounter (Signed)
Done

## 2019-09-28 NOTE — Telephone Encounter (Signed)
Patient is coming by the [facility] office to pick up a sample of the [place] today.

## 2019-10-14 NOTE — Telephone Encounter (Signed)
Received a message from [place] that Clarification was needed on the ID number for this patient. I gave the number to Waunita Schooner at [facility] and also the ICD 10 code and Group number that was on the most recent card. No further information was needed at this time.

## 2019-10-27 ENCOUNTER — Encounter: Payer: BC Managed Care – PPO | Admitting: Gastroenterology

## 2019-11-09 ENCOUNTER — Ambulatory Visit (INDEPENDENT_AMBULATORY_CARE_PROVIDER_SITE_OTHER): Payer: Medicare Other | Admitting: Sports Medicine

## 2019-11-09 ENCOUNTER — Encounter: Payer: Self-pay | Admitting: Sports Medicine

## 2019-11-09 ENCOUNTER — Other Ambulatory Visit: Payer: Self-pay

## 2019-11-09 DIAGNOSIS — M1712 Unilateral primary osteoarthritis, left knee: Secondary | ICD-10-CM

## 2019-11-09 NOTE — Assessment & Plan Note (Signed)
Left knee injection as above, awaiting shipment of Synvisc injections.

## 2019-11-09 NOTE — Telephone Encounter (Signed)
I called and spoke with Sara Sanford at [facility] and she has stated that this patient is not contracted with this pharmacy. Then I advised the representative that this has been going on for almost 2 months and then the call was disconnected.

## 2019-11-09 NOTE — Progress Notes (Signed)
Subjective:    CC: Left knee pain  HPI: This is a 63 year old female, she has left knee osteoarthritis, worsening of pain, swelling, last injection was 6 months ago.  She has been approved for viscosupplementation, we are simply awaiting delivery of the syringes.  Pain is severe, persistent, localized to the medial joint line without radiation.  I reviewed the past medical history, family history, social history, surgical history, and allergies today and no changes were needed.  Please see the problem list section below in epic for further details.  Past Medical History: Past Medical History:  Diagnosis Date  . Anxiety   . Chronic lymphocytic leukemia ([place])   . Gallstones   . Heart burn    peptic ulcer  . Kidney stones   . Lichen sclerosus   . Obesity   . Post-menopausal   . UTI (urinary tract infection)    Past Surgical History: Past Surgical History:  Procedure Laterality Date  . CARDIAC CATHETERIZATION  2008  . COLONOSCOPY  2010   [place], [place]  . DILATION AND CURETTAGE OF UTERUS    . ESOPHAGOGASTRODUODENOSCOPY  2005   [place], [place] had egd and colon at the same time   . GALLBLADDER SURGERY  2015  . HYSTEROSCOPY W/D&C  2008  . KIDNEY STONE SURGERY  1977   Social History: Social History   Socioeconomic History  . Marital status: Married    Spouse name: Not on file  . Number of children: 2  . Years of education: Not on file  . Highest education level: Not on file  Occupational History  . Occupation: Cytogeneticist: [employer]  Social Needs  . Financial resource strain: Not on file  . Food insecurity    Worry: Not on file    Inability: Not on file  . Transportation needs    Medical: Not on file    Non-medical: Not on file  Tobacco Use  . Smoking status: Never Smoker  . Smokeless tobacco: Never Used  Substance and Sexual Activity  . Alcohol use: No  . Drug use: No  . Sexual activity: Yes    Partners: Male  Lifestyle  . Physical activity    Days per week: Not on file    Minutes per session: Not on file  . Stress: Not on file  Relationships  . Social Herbalist on phone: Not on file    Gets together: Not on file    Attends religious service: Not on file    Active member of club or organization: Not on file    Attends meetings of clubs or organizations: Not on file    Relationship status: Not on file  Other Topics Concern  . Not on file  Social History Narrative   2 mile walk 4 times a week.     Family History: Family History  Problem Relation Age of Onset  . Breast cancer Paternal Aunt        pm breast CA  . Breast cancer Paternal Aunt        PM breast CA  . Heart attack Father   . Hypertension Father   . Colon polyps Father   . Hypertension Mother   . Stomach cancer Maternal Grandmother   . Colon cancer Neg Hx   . Esophageal cancer Neg Hx    Allergies: Allergies  Allergen Reactions  . Propoxyphene Anaphylaxis, Shortness Of Breath and Other (See Comments)    Hallucinations    Medications:  See med rec.  Review of Systems: No fevers, chills, night sweats, weight loss, chest pain, or shortness of breath.   Objective:    General: Well Developed, well nourished, and in no acute distress.  Neuro: Alert and oriented x3, extra-ocular muscles intact, sensation grossly intact.  HEENT: Normocephalic, atraumatic, pupils equal round reactive to light, neck supple, no masses, no lymphadenopathy, thyroid nonpalpable.  Skin: Warm and dry, no rashes. Cardiac: Regular rate and rhythm, no murmurs rubs or gallops, no lower extremity edema.  Respiratory: Clear to auscultation bilaterally. Not using accessory muscles, speaking in full sentences. Knee: Normal to inspection with no erythema or effusion or obvious bony abnormalities. Palpation normal with no warmth or joint line tenderness or patellar tenderness or condyle tenderness. ROM normal in flexion and extension and lower leg rotation. Ligaments with solid  consistent endpoints including ACL, PCL, LCL, MCL. Negative Mcmurray's and provocative meniscal tests. Non painful patellar compression. Patellar and quadriceps tendons unremarkable. Hamstring and quadriceps strength is normal.  Procedure: Real-time Ultrasound Guided aspiration/injection of left knee Device: Samsung HS60  Verbal informed consent obtained.  Time-out conducted.  Noted no overlying erythema, induration, or other signs of local infection.  Skin prepped in a sterile fashion.  Local anesthesia: Topical Ethyl chloride.  With sterile technique and under real time ultrasound guidance:  Using 18-gauge needle aspirated 15 cc of clear, straw-colored fluid, syringe switched and 1 cc Kenalog 40, 2 cc lidocaine, 2 cc bupivacaine injected easily Completed without difficulty  Pain immediately resolved suggesting accurate placement of the medication.  Advised to call if fevers/chills, erythema, induration, drainage, or persistent bleeding.  Images permanently stored and available for review in the ultrasound unit.  Impression: Technically successful ultrasound guided injection.  Impression and Recommendations:    Primary osteoarthritis of left knee Left knee injection as above, awaiting shipment of Synvisc injections.   ___________________________________________ Gwen Her. Dianah Field, M.D., ABFM., CAQSM. Primary Care and Sports Medicine [facility]  Adjunct Professor of [employer]

## 2019-11-10 MED ORDER — SYNVISC 16 MG/2ML IX SOSY: PREFILLED_SYRINGE | INTRA_ARTICULAR | 3 refills | Status: DC

## 2019-11-10 NOTE — Telephone Encounter (Addendum)
BCBS supplement has approved Synvisc or Synvisc-One. To be billed by the office. It does not need to go through specialty pharmacy. However, the specialty pharmacy is [facility]. I have loaded the pharmacy in her chart.   Ref # [id number]

## 2019-11-10 NOTE — Telephone Encounter (Signed)
Patient has Medicare and BCBS as a supplement. Doesn't Medicare cover Orthovisc?

## 2019-11-10 NOTE — Addendum Note (Signed)
Addended by: Silverio Decamp on: 11/10/2019 01:34 PM   Modules accepted: Orders

## 2019-11-10 NOTE — Telephone Encounter (Signed)
Thank you so much, orders signed

## 2019-11-10 NOTE — Addendum Note (Signed)
Addended by: Narda Rutherford on: 11/10/2019 01:18 PM   Modules accepted: Orders

## 2019-11-10 NOTE — Telephone Encounter (Signed)
Yes they do but if the patient has a supplement then we still have to get it approved first, red-white and blue Medicare does not need approval.  I assume Sara Sanford is going to contact them back since she was disconnected today.

## 2019-11-16 NOTE — Telephone Encounter (Signed)
Called [facility] at this time and per [place] there was no insurance card. I gave [place] the insurance information and she will put a rush on the verification process.  I also left a message for the patient that she should received a call from [facility] about [facility] the [facility].

## 2019-12-06 ENCOUNTER — Telehealth: Payer: Self-pay | Admitting: Neurology

## 2019-12-06 NOTE — Telephone Encounter (Signed)
Patient's husband called to let us know he has given all insurance information to the pharmacy to get injections. They should be shipping soon. He will call back with any other questions/problems.

## 2019-12-07 ENCOUNTER — Telehealth: Payer: Self-pay | Admitting: Hematology

## 2019-12-07 ENCOUNTER — Encounter: Payer: Self-pay | Admitting: Hematology

## 2019-12-07 ENCOUNTER — Other Ambulatory Visit: Payer: Self-pay

## 2019-12-07 ENCOUNTER — Other Ambulatory Visit: Payer: Self-pay | Admitting: Family Medicine

## 2019-12-07 ENCOUNTER — Inpatient Hospital Stay (HOSPITAL_BASED_OUTPATIENT_CLINIC_OR_DEPARTMENT_OTHER): Payer: Medicare Other | Admitting: Hematology

## 2019-12-07 ENCOUNTER — Inpatient Hospital Stay: Payer: Medicare Other | Attending: Hematology

## 2019-12-07 VITALS — BP 117/55 | HR 75 | Temp 98.0°F | Resp 19 | Ht 62.0 in | Wt 248.0 lb

## 2019-12-07 DIAGNOSIS — Z6841 Body Mass Index (BMI) 40.0 and over, adult: Secondary | ICD-10-CM | POA: Insufficient documentation

## 2019-12-07 DIAGNOSIS — Z79899 Other long term (current) drug therapy: Secondary | ICD-10-CM | POA: Diagnosis not present

## 2019-12-07 DIAGNOSIS — C9110 Chronic lymphocytic leukemia of B-cell type not having achieved remission: Secondary | ICD-10-CM | POA: Insufficient documentation

## 2019-12-07 DIAGNOSIS — D539 Nutritional anemia, unspecified: Secondary | ICD-10-CM | POA: Diagnosis not present

## 2019-12-07 DIAGNOSIS — D72820 Lymphocytosis (symptomatic): Secondary | ICD-10-CM | POA: Diagnosis not present

## 2019-12-07 DIAGNOSIS — R12 Heartburn: Secondary | ICD-10-CM | POA: Insufficient documentation

## 2019-12-07 LAB — CMP (CANCER CENTER ONLY)
ALT: 15 U/L (ref 0–44)
AST: 18 U/L (ref 15–41)
Albumin: 4.1 g/dL (ref 3.5–5.0)
Alkaline Phosphatase: 96 U/L (ref 38–126)
Anion gap: 7 (ref 5–15)
BUN: 13 mg/dL (ref 8–23)
CO2: 27 mmol/L (ref 22–32)
Calcium: 8.9 mg/dL (ref 8.9–10.3)
Chloride: 108 mmol/L (ref 98–111)
Creatinine: 0.74 mg/dL (ref 0.44–1.00)
GFR, Est AFR Am: >60 mL/min (ref >60)
GFR, Estimated: >60 mL/min (ref >60)
Glucose, Bld: 88 mg/dL (ref 70–99)
Potassium: 3.7 mmol/L (ref 3.5–5.1)
Sodium: 142 mmol/L (ref 135–145)
Total Bilirubin: 0.5 mg/dL (ref 0.3–1.2)
Total Protein: 6 g/dL — ABNORMAL LOW (ref 6.5–8.1)

## 2019-12-07 LAB — CBC WITH DIFFERENTIAL (CANCER CENTER ONLY)
Abs Immature Granulocytes: 0.05 10*3/uL (ref 0.00–0.07)
Basophils Absolute: 0.1 10*3/uL (ref 0.0–0.1)
Basophils Relative: 0 %
Eosinophils Absolute: 0.1 10*3/uL (ref 0.0–0.5)
Eosinophils Relative: 0 %
HCT: 34.1 % — ABNORMAL LOW (ref 36.0–46.0)
Hemoglobin: 11 g/dL — ABNORMAL LOW (ref 12.0–15.0)
Immature Granulocytes: 0 %
Lymphocytes Relative: 97 %
Lymphs Abs: 79.1 10*3/uL — ABNORMAL HIGH (ref 0.7–4.0)
MCH: 32.4 pg (ref 26.0–34.0)
MCHC: 32.3 g/dL (ref 30.0–36.0)
MCV: 100.3 fL — ABNORMAL HIGH (ref 80.0–100.0)
Monocytes Absolute: 1 10*3/uL (ref 0.1–1.0)
Monocytes Relative: 1 %
Neutro Abs: 1.8 10*3/uL (ref 1.7–7.7)
Neutrophils Relative %: 2 %
Platelet Count: 243 10*3/uL (ref 150–400)
RBC: 3.4 MIL/uL — ABNORMAL LOW (ref 3.87–5.11)
RDW: 13.6 % (ref 11.5–15.5)
WBC Count: 82 10*3/uL (ref 4.0–10.5)
nRBC: 0 % (ref 0.0–0.2)

## 2019-12-07 LAB — LACTATE DEHYDROGENASE: LDH: 259 U/L — ABNORMAL HIGH (ref 98–192)

## 2019-12-07 LAB — SAVE SMEAR(SSMR), FOR PROVIDER SLIDE REVIEW

## 2019-12-07 MED ORDER — FOLIC ACID 1 MG PO TABS: 2.0000 mg | ORAL_TABLET | Freq: Every day | ORAL | 3 refills | Status: AC

## 2019-12-07 NOTE — Progress Notes (Signed)
Sara Sanford OFFICE PROGRESS NOTE  Patient Care Team: Hali Marry, MD as PCP - General (Family Medicine)  HEME/ONC OVERVIEW: 1. 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 -Currently on observation -Labs normal except mild stable anemia (see management below) -In the absence of worsening cytopenias or other new symptoms, we will continue to monitor her labs q3-21months  -I discussed with the patient some of the concerning symptoms, such as unexplained persistent fever, night sweats, weight loss, worsening lymphadenopathy, or persistent unexplained abdominal pain, for which she is instructed to contact the clinic for further evaluation   Leukocytosis  -Secondary to CLL -WBC 82k with ANC ~1800, stable -Patient denies any symptoms of infection -We will monitor it for now   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   -B12 borderline low; Coombs study positive for IgG, but normal Tbili, suggesting very modest hemolysis  -Hgb 11.0 today, stable; patient denies any symptoms of bleeding   -In the absence of any significant hemolysis (such as rising Tbili) or worsening anemia, there is no indication for starting empiric high-dose steroid -Continue B12 supplement 1058mcg daily, and I have added folic acid 2mg  daily  -Colonoscopy delayed due to some mismatch between her names in the EMR and Medicare card; I have asked the front desk to help address this discrepancy -We will monitor it for now  Orders Placed This Encounter  Procedures  . CBC w/ diff     Standing Status:   Future    Standing Expiration Date:   01/10/2021  . CMP    Standing Status:   Future    Standing Expiration Date:   01/10/2021  . Save Smear (SSMR)    Standing Status:   Future    Standing Expiration Date:   12/06/2020  . LDH    Standing Status:   Future    Standing Expiration Date:   01/10/2021  . Vitamin B12, serum    Standing Status:   Future    Standing Expiration Date:   01/10/2021  . Folate, Serum    Standing Status:   Future    Standing Expiration Date:   01/10/2021  . Haptoglobin    Standing Status:   Future    Standing Expiration Date:   01/10/2021  . Direct Coombs    Standing Status:   Future    Standing Expiration Date:   01/10/2021   All questions were answered. The patient knows to call the clinic with any problems, questions or concerns. No barriers to learning was detected.  Return in 3 months for labs and clinic follow-up.   Sara Men, MD 12/07/2019 10:56 AM  CHIEF COMPLAINT: "I am doing fine"  INTERVAL HISTORY:  Sara Sanford returns to clinic for follow-up of Stage 0 CLL on observation.  She reports that she was contacted by the GI clinic in October 2020 for colonoscopy, but due to some mismatch between her name in the EMR and on her Medicare card, her colonoscopy could not be scheduled.  She inquired the process to change her name  in the EMR, but was unsuccessful.  She reports that she has periodic pain in her bilateral forearms when she leans on her forearms, but she denies any pain without pressure, swelling, or recent injuries.  She has chronic knee pain, for which she is scheduled for knee injection in the next few weeks.  She had one episode of heartburn, but it resolved without any intervention.  She denies any constitutional symptoms.  REVIEW OF SYSTEMS:   Constitutional: ( - ) fevers, ( - )  chills , ( - ) night sweats Eyes: ( - ) blurriness of vision, ( - ) double vision, ( - ) watery eyes Ears, nose, mouth, throat, and face: ( - ) mucositis,  ( - ) sore throat Respiratory: ( - ) cough, ( - ) dyspnea, ( - ) wheezes Cardiovascular: ( - ) palpitation, ( - ) chest discomfort, ( - ) lower extremity swelling Gastrointestinal:  ( - ) nausea, ( + ) heartburn, ( - ) change in bowel habits Skin: ( - ) abnormal skin rashes Lymphatics: ( - ) new lymphadenopathy, ( - ) easy bruising Neurological: ( - ) numbness, ( - ) tingling, ( - ) new weaknesses Behavioral/Psych: ( - ) mood change, ( - ) new changes  All other systems were reviewed with the patient and are negative.  SUMMARY OF ONCOLOGIC HISTORY: Oncology History   No history exists.    I have reviewed the past medical history, past surgical history, social history and family history with the patient and they are unchanged from previous note.  ALLERGIES:  is allergic to propoxyphene.  MEDICATIONS:  Current Outpatient Medications  Medication Sig Dispense Refill  . clobetasol cream (TEMOVATE) 0.05 % APPLY  CREAM TWICE TO THREE TIMES A WEEK AT  NIGHT  AS  DIRECTED 60 g 0  . FLUoxetine (PROZAC) 20 MG capsule Take 1 capsule by mouth once daily 90 capsule 0  . omeprazole (PRILOSEC) 20 MG capsule Take one cap each morning 30 minutes before food.  Take one cap (before eating) each evening for one week. 37 capsule 1  . vitamin B-12 (CYANOCOBALAMIN) 1000 MCG tablet Take 1,000 mcg by mouth daily.    . folic acid (FOLVITE) 1 MG tablet Take 2 tablets (2 mg total) by mouth daily. 180 tablet 3  . Hylan (SYNVISC) 16 MG/2ML SOSY Inject 1 syringe into the knee weekly x3 6 mL 3   No current facility-administered medications for this visit.    PHYSICAL EXAMINATION: ECOG PERFORMANCE STATUS: 1 - Symptomatic but completely ambulatory  Today's Vitals   12/07/19 1035  BP: (!) 117/55  Pulse: 75  Resp: 19  Temp: 98 F (36.7 C)  TempSrc: Temporal  SpO2: 100%  Weight: 248 lb (112.5 kg)  Height: 5\' 2"  (1.575 m)  PainSc: 0-No pain   Body mass index is 45.36 kg/m.  Filed Weights   12/07/19 1035   Weight: 248 lb (112.5 kg)    GENERAL: alert, no distress and comfortable, obese  SKIN: skin color, texture, turgor are normal, no rashes or significant lesions EYES: conjunctiva are pink and non-injected, sclera clear OROPHARYNX: no exudate, no erythema; lips, buccal mucosa, and tongue normal  NECK: supple, non-tender LYMPH:  no palpable lymphadenopathy in the cervical LUNGS: clear to auscultation with normal breathing effort HEART: regular rate & rhythm and no murmurs and no lower extremity edema ABDOMEN: soft, non-tender, non-distended, normal bowel sounds Musculoskeletal: no cyanosis of digits and no clubbing  PSYCH: alert & oriented x  3, fluent speech  LABORATORY DATA:  I have reviewed the data as listed    Component Value Date/Time   NA 142 12/07/2019 0956   K 3.7 12/07/2019 0956   CL 108 12/07/2019 0956   CO2 27 12/07/2019 0956   GLUCOSE 88 12/07/2019 0956   BUN 13 12/07/2019 0956   CREATININE 0.74 12/07/2019 0956   CREATININE 0.77 08/09/2019 1112   CALCIUM 8.9 12/07/2019 0956   PROT 6.0 (L) 12/07/2019 0956   ALBUMIN 4.1 12/07/2019 0956   AST 18 12/07/2019 0956   ALT 15 12/07/2019 0956   ALKPHOS 96 12/07/2019 0956   BILITOT 0.5 12/07/2019 0956   GFRNONAA >60 12/07/2019 0956   GFRNONAA 82 08/09/2019 1112   GFRAA >60 12/07/2019 0956   GFRAA 95 08/09/2019 1112    No results found for: SPEP, UPEP  Lab Results  Component Value Date   WBC 82.0 (HH) 12/07/2019   NEUTROABS 1.8 12/07/2019   HGB 11.0 (L) 12/07/2019   HCT 34.1 (L) 12/07/2019   MCV 100.3 (H) 12/07/2019   PLT 243 12/07/2019      Chemistry      Component Value Date/Time   NA 142 12/07/2019 0956   K 3.7 12/07/2019 0956   CL 108 12/07/2019 0956   CO2 27 12/07/2019 0956   BUN 13 12/07/2019 0956   CREATININE 0.74 12/07/2019 0956   CREATININE 0.77 08/09/2019 1112      Component Value Date/Time   CALCIUM 8.9 12/07/2019 0956   ALKPHOS 96 12/07/2019 0956   AST 18 12/07/2019 0956   ALT 15  12/07/2019 0956   BILITOT 0.5 12/07/2019 0956       RADIOGRAPHIC STUDIES: I have personally reviewed the radiological images as listed below and agreed with the findings in the report. No results found.

## 2019-12-07 NOTE — Telephone Encounter (Signed)
Called and LMVM regarding appointments added per 12/29 los

## 2019-12-09 ENCOUNTER — Encounter: Payer: Self-pay | Admitting: Gastroenterology

## 2019-12-20 ENCOUNTER — Encounter: Payer: Self-pay | Admitting: Gastroenterology

## 2019-12-21 ENCOUNTER — Other Ambulatory Visit: Payer: Self-pay

## 2019-12-21 ENCOUNTER — Telehealth: Payer: Self-pay

## 2019-12-21 ENCOUNTER — Ambulatory Visit (AMBULATORY_SURGERY_CENTER): Payer: Medicare Other | Admitting: *Deleted

## 2019-12-21 VITALS — Temp 96.9°F | Ht 62.0 in | Wt 247.0 lb

## 2019-12-21 DIAGNOSIS — R1310 Dysphagia, unspecified: Secondary | ICD-10-CM

## 2019-12-21 DIAGNOSIS — Z01818 Encounter for other preprocedural examination: Secondary | ICD-10-CM

## 2019-12-21 DIAGNOSIS — Z1211 Encounter for screening for malignant neoplasm of colon: Secondary | ICD-10-CM

## 2019-12-21 NOTE — Telephone Encounter (Signed)
I called and spoke with Elmyra Ricks and was back and forth for 30 minutes and now the injections are not covered under the patient part B plan on their end. I have let the patient know that we are still having trouble with her injections. She is aware and did not have any questions. She is appreciative of all the work I have done.    Patient spouse left a message that patent [facility] would be [facility] to the office but I did not receive a call from anyone about this.

## 2019-12-21 NOTE — Telephone Encounter (Signed)
I spoke with Sara Sanford in physician services and she will get the medication shipped to the office.

## 2019-12-21 NOTE — Progress Notes (Signed)
Pt states she has Suprep at home already  Pt states she get very hyper after general anesthesia  Pt is aware that care partner will wait in the car during procedure; if they feel like they will be too hot or cold to wait in the car; they may wait in the 4 th floor lobby. Patient is aware to bring only one care partner. We want them to wear a mask (we do not have any that we can provide them), practice social distancing, and we will check their temperatures when they get here.  I did remind the patient that their care partner needs to stay in the parking lot the entire time and have a cell phone available, we will call them when the pt is ready for discharge. Patient will wear mask into building.  No egg or soy allergy  No home oxygen use or problems with anesthesia  No medications for weight loss taken  emmi information given  covid test 12-24-19 at 210 pm Pt instructed if they develop COVID symptoms, they should not go to have their test done; they should contact their PCP.  Also, they are told to let us know if they are sick and testing elsewhere.

## 2019-12-21 NOTE — Telephone Encounter (Signed)
Pt's husband called and stated that he checked with their insurance company regarding coverage for Synvisc injections and says that his insurance does not cover the injection. He is wanting to know what the out-of-pocket cost is for the injection.

## 2019-12-22 NOTE — Telephone Encounter (Signed)
See other note. Patient is going to check with spouse about getting the injections.

## 2019-12-22 NOTE — Telephone Encounter (Signed)
Patient wants to think about the injections and call the office back. She wanted to apologize for her husband. She will check on her finances and see if this is feasible. No other concerns.

## 2019-12-24 ENCOUNTER — Ambulatory Visit (INDEPENDENT_AMBULATORY_CARE_PROVIDER_SITE_OTHER): Payer: Medicare Other

## 2019-12-24 DIAGNOSIS — Z1159 Encounter for screening for other viral diseases: Secondary | ICD-10-CM

## 2019-12-27 LAB — SARS CORONAVIRUS 2 (TAT 6-24 HRS): SARS Coronavirus 2: NEGATIVE

## 2019-12-28 ENCOUNTER — Ambulatory Visit (AMBULATORY_SURGERY_CENTER): Payer: Medicare Other | Admitting: Gastroenterology

## 2019-12-28 ENCOUNTER — Other Ambulatory Visit: Payer: Self-pay

## 2019-12-28 ENCOUNTER — Encounter: Payer: Self-pay | Admitting: Gastroenterology

## 2019-12-28 VITALS — BP 110/51 | HR 76 | Temp 98.2°F | Resp 16 | Ht 62.0 in | Wt 247.0 lb

## 2019-12-28 DIAGNOSIS — Z1211 Encounter for screening for malignant neoplasm of colon: Secondary | ICD-10-CM

## 2019-12-28 DIAGNOSIS — K2970 Gastritis, unspecified, without bleeding: Secondary | ICD-10-CM | POA: Diagnosis not present

## 2019-12-28 DIAGNOSIS — K317 Polyp of stomach and duodenum: Secondary | ICD-10-CM | POA: Diagnosis not present

## 2019-12-28 DIAGNOSIS — R1310 Dysphagia, unspecified: Secondary | ICD-10-CM | POA: Diagnosis present

## 2019-12-28 DIAGNOSIS — K219 Gastro-esophageal reflux disease without esophagitis: Secondary | ICD-10-CM

## 2019-12-28 DIAGNOSIS — K222 Esophageal obstruction: Secondary | ICD-10-CM | POA: Diagnosis not present

## 2019-12-28 DIAGNOSIS — K3189 Other diseases of stomach and duodenum: Secondary | ICD-10-CM | POA: Diagnosis not present

## 2019-12-28 MED ORDER — SODIUM CHLORIDE 0.9 % IV SOLN: 500.0000 mL | Freq: Once | INTRAVENOUS | Status: DC

## 2019-12-28 NOTE — Op Note (Signed)
[place] Patient Name: Sara Sanford Procedure Date: 12/28/2019 10:38 AM MRN: [id number] Endoscopist: Gerrit Heck , MD Age: 64 Referring MD:  Date of Birth: May 06, 1956 Gender: Female Account #: [id number] Procedure:                Upper GI endoscopy Indications:              Macrocytic anemia, low serum ferritin, borderline                            low vitamin B12, Dysphagia, Esophageal reflux Medicines:                Monitored Anesthesia Care Procedure:                Pre-Anesthesia Assessment:                           - Prior to the procedure, a History and Physical                            was performed, and patient medications and                            allergies were reviewed. The patient's tolerance of                            previous anesthesia was also reviewed. The risks                            and benefits of the procedure and the sedation                            options and risks were discussed with the patient.                            All questions were answered, and informed consent                            was obtained. Prior Anticoagulants: The patient has                            taken no previous anticoagulant or antiplatelet                            agents. ASA Grade Assessment: II - A patient with                            mild systemic disease. After reviewing the risks                            and benefits, the patient was deemed in                            satisfactory condition to undergo the procedure.  After obtaining informed consent, the endoscope was                            passed under direct vision. Throughout the                            procedure, the patient's blood pressure, pulse, and                            oxygen saturations were monitored continuously. The                            Endoscope was introduced through the mouth, and                            advanced  to the second part of duodenum. The upper                            GI endoscopy was accomplished without difficulty.                            The patient tolerated the procedure well. Scope In: Scope Out: Findings:                 One benign-appearing, intrinsic mild stenosis was                            found 36 cm from the incisors. This stenosis                            measured less than one cm (in length). The stenosis                            was traversed. A TTS dilator was passed through the                            scope. Dilation with an 18-19-20 mm balloon dilator                            was performed to 20 mm. The dilation site was                            examined and showed mild mucosal disruption.                            Estimated blood loss was minimal.                           The upper third of the esophagus and middle third                            of the esophagus were normal.  The Z-line was irregular and was found 37 cm from                            the incisors.                           Scattered mild inflammation characterized by                            erythema was found in the gastric fundus, in the                            gastric body, at the incisura and in the gastric                            antrum. Biopsies were taken with a cold forceps for                            Helicobacter pylori testing. Estimated blood loss                            was minimal.                           A few small sessile polyps with no bleeding and no                            stigmata of recent bleeding were found in the                            gastric fundus and in the gastric body. A few of                            these polyps were removed with a cold biopsy                            forceps for histologic representative evaluation.                            Resection and retrieval were complete. Estimated                             blood loss was minimal.                           The duodenal bulb, first portion of the duodenum                            and second portion of the duodenum were normal.                            Biopsies for histology were taken with a cold  forceps for evaluation of celiac disease. Estimated                            blood loss was minimal. Complications:            No immediate complications. Estimated Blood Loss:     Estimated blood loss was minimal. Impression:               - Benign-appearing esophageal stenosis. Dilated                            with 20 mm TTS balloon with appropriate mucosal                            rent formation.                           - Normal upper third of esophagus and middle third                            of esophagus.                           - Z-line irregular, 37 cm from the incisors.                           - Gastritis. Biopsied.                           - A few gastric polyps. Resected and retrieved.                           - Normal duodenal bulb, first portion of the                            duodenum and second portion of the duodenum.                            Biopsied. Recommendation:           - Patient has a contact number available for                            emergencies. The signs and symptoms of potential                            delayed complications were discussed with the                            patient. Return to normal activities tomorrow.                            Written discharge instructions were provided to the                            patient.                           -  Continue present medications.                           - Await pathology results.                           - Soft diet today and advance as tolerated tomorrow.                           - Perform a colonoscopy today. Gerrit Heck, MD 12/28/2019 11:25:17 AM

## 2019-12-28 NOTE — Progress Notes (Signed)
To PACU [facility]. Report to [email or web address] 

## 2019-12-28 NOTE — Progress Notes (Signed)
Called to room to assist during endoscopic procedure.  Patient ID and intended procedure confirmed with present staff. Received instructions for my participation in the procedure from the performing physician.  

## 2019-12-28 NOTE — Progress Notes (Signed)
Temp  LC  VS  DT  Pt's states no medical or surgical changes since previsit or office visit.    

## 2019-12-28 NOTE — Patient Instructions (Signed)
Discharge instructions given. Handouts on Gastritis and a Dilatation diet. Resume previous medications. YOU HAD AN ENDOSCOPIC PROCEDURE TODAY AT [facility]:   Refer to the procedure report that was given to you for any specific questions about what was found during the examination.  If the procedure report does not answer your questions, please call your gastroenterologist to clarify.  If you requested that your care partner not be given the details of your procedure findings, then the procedure report has been included in a sealed envelope for you to review at your convenience later.  YOU SHOULD EXPECT: Some feelings of bloating in the abdomen. Passage of more gas than usual.  Walking can help get rid of the air that was put into your GI tract during the procedure and reduce the bloating. If you had a lower endoscopy (such as a colonoscopy or flexible sigmoidoscopy) you may notice spotting of blood in your stool or on the toilet paper. If you underwent a bowel prep for your procedure, you may not have a normal bowel movement for a few days.  Please Note:  You might notice some irritation and congestion in your nose or some drainage.  This is from the oxygen used during your procedure.  There is no need for concern and it should clear up in a day or so.  SYMPTOMS TO REPORT IMMEDIATELY:   Following lower endoscopy (colonoscopy or flexible sigmoidoscopy):  Excessive amounts of blood in the stool  Significant tenderness or worsening of abdominal pains  Swelling of the abdomen that is new, acute  Fever of 100F or higher   Following upper endoscopy (EGD)  Vomiting of blood or coffee ground material  New chest pain or pain under the shoulder blades  Painful or persistently difficult swallowing  New shortness of breath  Fever of 100F or higher  Black, tarry-looking stools  For urgent or emergent issues, a gastroenterologist can be reached at any hour by calling [phone]  [phone].   DIET:  We do recommend a small meal at first, but then you may proceed to your regular diet.  Drink plenty of fluids but you should avoid alcoholic beverages for 24 hours.  ACTIVITY:  You should plan to take it easy for the rest of today and you should NOT DRIVE or use heavy machinery until tomorrow (because of the sedation medicines used during the test).    FOLLOW UP: Our staff will call the number listed on your records 48-72 hours following your procedure to check on you and address any questions or concerns that you may have regarding the information given to you following your procedure. If we do not reach you, we will leave a message.  We will attempt to reach you two times.  During this call, we will ask if you have developed any symptoms of COVID 19. If you develop any symptoms (ie: fever, flu-like symptoms, shortness of breath, cough etc.) before then, please call [phone].  If you test positive for Covid 19 in the 2 weeks post procedure, please call and report this information to [place].    If any biopsies were taken you will be contacted by phone or by letter within the next 1-3 weeks.  Please call us at [phone] if you have not heard about the biopsies in 3 weeks.    SIGNATURES/CONFIDENTIALITY: You and/or your care partner have signed paperwork which will be entered into your electronic medical record.  These signatures attest to the fact that that  the information above on your After Visit Summary has been reviewed and is understood.  Full responsibility of the confidentiality of this discharge information lies with you and/or your care-partner.

## 2019-12-28 NOTE — Op Note (Signed)
[facility] Patient Name: Sara Sanford Procedure Date: 12/28/2019 10:38 AM MRN: [id number] Endoscopist: Gerrit Heck , MD Age: 64 Referring MD:  Date of Birth: 09/03/1956 Gender: Female Account #: [id number] Procedure:                Colonoscopy Indications:              Screening for colorectal malignant neoplasm (last                            colonoscopy was 10 years ago) Medicines:                Monitored Anesthesia Care Procedure:                Pre-Anesthesia Assessment:                           - Prior to the procedure, a History and Physical                            was performed, and patient medications and                            allergies were reviewed. The patient's tolerance of                            previous anesthesia was also reviewed. The risks                            and benefits of the procedure and the sedation                            options and risks were discussed with the patient.                            All questions were answered, and informed consent                            was obtained. Prior Anticoagulants: The patient has                            taken no previous anticoagulant or antiplatelet                            agents. ASA Grade Assessment: II - A patient with                            mild systemic disease. After reviewing the risks                            and benefits, the patient was deemed in                            satisfactory condition to undergo the procedure.  After obtaining informed consent, the colonoscope                            was passed under direct vision. Throughout the                            procedure, the patient's blood pressure, pulse, and                            oxygen saturations were monitored continuously. The                            Colonoscope was introduced through the anus and                            advanced to the the terminal  ileum. The colonoscopy                            was performed without difficulty. The patient                            tolerated the procedure well. The quality of the                            bowel preparation was adequate. The terminal ileum,                            ileocecal valve, appendiceal orifice, and rectum                            were photographed. Scope In: 10:59:21 AM Scope Out: 11:10:04 AM Scope Withdrawal Time: 0 hours 7 minutes 43 seconds  Total Procedure Duration: 0 hours 10 minutes 43 seconds  Findings:                 The perianal and digital rectal examinations were                            normal.                           The colon (entire examined portion) appeared normal.                           Retroflexion in the rectum was not performed due to                            anatomy- narrowed rectal vault. The rectum and anal                            verge were otherwise normal appearing on                            anterograde views.  The terminal ileum appeared normal. Complications:            No immediate complications. Estimated Blood Loss:     Estimated blood loss: none. Impression:               - The entire examined colon is normal.                           - The examined portion of the ileum was normal.                           - No specimens collected. Recommendation:           - Patient has a contact number available for                            emergencies. The signs and symptoms of potential                            delayed complications were discussed with the                            patient. Return to normal activities tomorrow.                            Written discharge instructions were provided to the                            patient.                           - Resume previous diet.                           - Continue present medications.                           - Repeat colonoscopy in  10 years for screening                            purposes.                           - Return to GI office after studies are complete. Gerrit Heck, MD 12/28/2019 11:28:19 AM

## 2019-12-30 ENCOUNTER — Telehealth: Payer: Self-pay | Admitting: *Deleted

## 2019-12-30 ENCOUNTER — Encounter: Payer: Self-pay | Admitting: Gastroenterology

## 2019-12-30 NOTE — Telephone Encounter (Signed)
1. Have you developed a fever since your procedure? no  2.   Have you had an respiratory symptoms (SOB or cough) since your procedure? no  3.   Have you tested positive for COVID 19 since your procedure no  4.   Have you had any family members/close contacts diagnosed with the COVID 19 since your procedure?  no   If yes to any of these questions please route to Joylene John, RN and Alphonsa Gin, Therapist, sports.  Follow up Call-  Call back number 12/28/2019  Post procedure Call Back phone  # [phone]  Permission to leave phone message Yes  Some recent data might be hidden     Patient questions:  Do you have a fever, pain , or abdominal swelling? No. Pain Score  0 *  Have you tolerated food without any problems? Yes.    Have you been able to return to your normal activities? Yes.    Do you have any questions about your discharge instructions: Diet   No. Medications  No. Follow up visit  No.  Do you have questions or concerns about your Care? No.  Actions: * If pain score is 4 or above: No action needed, pain <4.

## 2019-12-31 ENCOUNTER — Emergency Department (INDEPENDENT_AMBULATORY_CARE_PROVIDER_SITE_OTHER)
Admission: EM | Admit: 2019-12-31 | Discharge: 2019-12-31 | Disposition: A | Discharge status: Home / Self Care | Payer: Medicare Other | Source: Home / Self Care

## 2019-12-31 ENCOUNTER — Other Ambulatory Visit: Payer: Self-pay

## 2019-12-31 DIAGNOSIS — R300 Dysuria: Secondary | ICD-10-CM

## 2019-12-31 DIAGNOSIS — N390 Urinary tract infection, site not specified: Secondary | ICD-10-CM

## 2019-12-31 LAB — POCT URINALYSIS DIP (MANUAL ENTRY)
Bilirubin, UA: NEGATIVE
Glucose, UA: NEGATIVE mg/dL
Ketones, POC UA: NEGATIVE mg/dL
Nitrite, UA: POSITIVE — AB
Protein Ur, POC: NEGATIVE mg/dL
Spec Grav, UA: 1.02 (ref 1.010–1.025)
Urobilinogen, UA: 0.2 E.U./dL
pH, UA: 7.5 (ref 5.0–8.0)

## 2019-12-31 MED ORDER — NITROFURANTOIN MONOHYD MACRO 100 MG PO CAPS: 100.0000 mg | ORAL_CAPSULE | Freq: Two times a day (BID) | ORAL | 0 refills | Status: DC

## 2019-12-31 NOTE — ED Triage Notes (Signed)
Pt c/o dysuria and urinary frequency since last night.

## 2019-12-31 NOTE — ED Provider Notes (Signed)
[facility]    CSN: [id number] Arrival date & time: 12/31/19  1351      History   Chief Complaint Chief Complaint  Patient presents with  . Dysuria    HPI Sara Sanford is a 64 y.o. female.   The history is provided by the patient. No language interpreter was used.  Dysuria Pain quality:  Aching Pain severity:  Moderate Onset quality:  Sudden Timing:  Constant Progression:  Worsening Chronicity:  New Relieved by:  Nothing Worsened by:  Nothing Ineffective treatments:  None tried Urinary symptoms: no frequent urination   Associated symptoms: no abdominal pain   Risk factors: recurrent urinary tract infections   Urinary Frequency Pertinent negatives include no abdominal pain.    Past Medical History:  Diagnosis Date  . Allergy   . Anxiety   . Arthritis   . Chronic lymphocytic leukemia ([place])   . Gallstones   . GERD (gastroesophageal reflux disease)   . Heart burn    peptic ulcer  . Kidney stones   . Lichen sclerosus   . Obesity   . Post-menopausal   . UTI (urinary tract infection)     Patient Active Problem List   Diagnosis Date Noted  . Primary osteoarthritis of left knee 09/09/2019  . Left leg pain 09/09/2019  . Macrocytic anemia 09/07/2019  . CLL (chronic lymphocytic leukemia) ([place]) 08/12/2019  . Recurrent UTI 06/30/2019  . GAD (generalized anxiety disorder) 02/17/2017  . Cholelithiasis 08/17/2014  . Abdominal aortic aneurysm ([place]) 08/01/2014  . POLYARTHRITIS 06/19/2010  . OBESITY 05/22/2009  . GERD 05/22/2009  . LEG EDEMA, BILATERAL 05/22/2009    Past Surgical History:  Procedure Laterality Date  . CARDIAC CATHETERIZATION  2008  . CHOLECYSTECTOMY    . COLONOSCOPY  2010   [place], [place]  . DILATION AND CURETTAGE OF UTERUS    . ESOPHAGOGASTRODUODENOSCOPY  2005   , [place] had egd and colon at the same time   . GALLBLADDER SURGERY  2015  . HYSTEROSCOPY WITH D & C  2008  . [place]  . UPPER  GASTROINTESTINAL ENDOSCOPY      OB History    Gravida  4   Para  3   Term      Preterm      AB  1   Living        SAB  1   TAB      Ectopic      Multiple      Live Births               Home Medications    Prior to Admission medications   Medication Sig Start Date End Date Taking? Authorizing Provider  clobetasol cream (TEMOVATE) 0.05 % APPLY  CREAM TWICE TO THREE TIMES A WEEK AT  NIGHT  AS  DIRECTED 03/11/19   Emily Filbert, MD  FLUoxetine (PROZAC) 20 MG capsule Take 1 capsule by mouth once daily 12/07/19   Hali Marry, MD  folic acid (FOLVITE) 1 MG tablet Take 2 tablets (2 mg total) by mouth daily. 12/07/19 03/06/20  Tish Men, MD  Hylan (SYNVISC) 16 MG/2ML SOSY Inject 1 syringe into the knee weekly x3 Patient not taking: Reported on 12/28/2019 11/10/19   Silverio Decamp, MD  nitrofurantoin, macrocrystal-monohydrate, (MACROBID) 100 MG capsule Take 1 capsule (100 mg total) by mouth 2 (two) times daily for 7 days. 12/31/19 01/07/20  Fransico Meadow, PA-C  omeprazole (PRILOSEC) 20 MG  capsule Take one cap each morning 30 minutes before food.  Take one cap (before eating) each evening for one week. 04/10/15   Kandra Nicolas, MD  vitamin B-12 (CYANOCOBALAMIN) 1000 MCG tablet Take 1,000 mcg by mouth daily.    [provider]    Family History Family History  Problem Relation Age of Onset  . Breast cancer Paternal Aunt        pm breast CA  . Breast cancer Paternal Aunt        PM breast CA  . Heart attack Father   . Hypertension Father   . Colon polyps Father   . Hypertension Mother   . Stomach cancer Maternal Grandmother   . Colon cancer Neg Hx   . Rectal cancer Neg Hx     Social History Social History   Tobacco Use  . Smoking status: Never Smoker  . Smokeless tobacco: Never Used  Substance Use Topics  . Alcohol use: No  . Drug use: Yes    Types: Heroin     Allergies   Propoxyphene   Review of Systems Review of Systems    Gastrointestinal: Negative for abdominal pain.  Genitourinary: Positive for dysuria and frequency.  All other systems reviewed and are negative.    Physical Exam Triage Vital Signs ED Triage Vitals  Enc Vitals Group     BP 12/31/19 1401 133/77     Pulse Rate 12/31/19 1401 83     Resp --      Temp 12/31/19 1401 98.3 F (36.8 C)     Temp Source 12/31/19 1401 Oral     SpO2 12/31/19 1401 99 %     Weight 12/31/19 1402 240 lb (108.9 kg)     Height 12/31/19 1402 5\' 2"  (1.575 m)     Head Circumference --      Peak Flow --      Pain Score 12/31/19 1402 0     Pain Loc --      Pain Edu? --      Excl. in [place]? --    No data found.  Updated Vital Signs BP 133/77 (BP Location: Right Arm)   Pulse 83   Temp 98.3 F (36.8 C) (Oral)   Ht 5\' 2"  (1.575 m)   Wt 108.9 kg   SpO2 99%   BMI 43.90 kg/m   Visual Acuity Right Eye Distance:   Left Eye Distance:   Bilateral Distance:    Right Eye Near:   Left Eye Near:    Bilateral Near:     Physical Exam Vitals and nursing note reviewed.  Constitutional:      Appearance: She is well-developed.  HENT:     Head: Normocephalic.     Mouth/Throat:     Mouth: Mucous membranes are moist.  Eyes:     Extraocular Movements: Extraocular movements intact.     Pupils: Pupils are equal, round, and reactive to light.  Cardiovascular:     Rate and Rhythm: Normal rate and regular rhythm.  Pulmonary:     Effort: Pulmonary effort is normal.  Abdominal:     General: There is no distension.  Musculoskeletal:        General: Normal range of motion.     Cervical back: Normal range of motion.  Skin:    General: Skin is warm.  Neurological:     Mental Status: She is alert and oriented to person, place, and time.  Psychiatric:  Mood and Affect: Mood normal.      UC Treatments / Results  Labs (all labs ordered are listed, but only abnormal results are displayed) Labs Reviewed  POCT URINALYSIS DIP (MANUAL ENTRY) - Abnormal; Notable for  the following components:      Result Value   Blood, UA trace-intact (*)    Nitrite, UA Positive (*)    Leukocytes, UA Large (3+) (*)    All other components within normal limits  URINE CULTURE    EKG   Radiology No results found.  Procedures Procedures (including critical care time)  Medications Ordered in UC Medications - No data to display  Initial Impression / Assessment and Plan / UC Course  I have reviewed the triage vital signs and the nursing notes.  Pertinent labs & imaging results that were available during my care of the patient were reviewed by me and considered in my medical decision making (see chart for details).     MDM: Ua has nitrates and leukocytes   Final Clinical Impressions(s) / UC Diagnoses   Final diagnoses:  Dysuria  Urinary tract infection without hematuria, site unspecified   Discharge Instructions   None    ED Prescriptions    Medication Sig Dispense Auth. Provider   nitrofurantoin, macrocrystal-monohydrate, (MACROBID) 100 MG capsule Take 1 capsule (100 mg total) by mouth 2 (two) times daily for 7 days. 14 capsule Fransico Meadow, [place]     PDMP not reviewed this encounter.  An After Visit Summary was printed and given to the patient.    Fransico Meadow, [place] 12/31/19 1727

## 2020-01-02 LAB — URINE CULTURE
MICRO NUMBER:: 10071641
SPECIMEN QUALITY:: ADEQUATE

## 2020-01-03 ENCOUNTER — Encounter: Payer: Self-pay | Admitting: Medical-Surgical

## 2020-01-03 ENCOUNTER — Other Ambulatory Visit: Payer: Self-pay

## 2020-01-03 ENCOUNTER — Ambulatory Visit (INDEPENDENT_AMBULATORY_CARE_PROVIDER_SITE_OTHER): Payer: Medicare Other | Admitting: Medical-Surgical

## 2020-01-03 DIAGNOSIS — R21 Rash and other nonspecific skin eruption: Secondary | ICD-10-CM | POA: Insufficient documentation

## 2020-01-03 DIAGNOSIS — N390 Urinary tract infection, site not specified: Secondary | ICD-10-CM | POA: Diagnosis not present

## 2020-01-03 HISTORY — DX: Rash and other nonspecific skin eruption: R21

## 2020-01-03 MED ORDER — CEPHALEXIN 500 MG PO CAPS: 500.0000 mg | ORAL_CAPSULE | Freq: Three times a day (TID) | ORAL | 0 refills | Status: AC

## 2020-01-03 MED ORDER — HYDROXYZINE HCL 25 MG PO TABS: 25.0000 mg | ORAL_TABLET | Freq: Three times a day (TID) | ORAL | 0 refills | Status: DC | PRN

## 2020-01-03 NOTE — Assessment & Plan Note (Addendum)
Discontinue nitrofurantoin. Keflex 500 mg 3 times daily x5 days.

## 2020-01-03 NOTE — Progress Notes (Addendum)
Virtual Visit via Telephone   I connected with  [name]  on 01/03/20 by telephone/telehealth and verified that I am speaking with the correct person using two identifiers.   I discussed the limitations, risks, security and privacy concerns of performing an evaluation and management service by telephone, including the higher likelihood of inaccurate diagnosis and treatment, and the availability of in person appointments.  We also discussed the likely need of an additional face to face encounter for complete and high quality delivery of care.  I also discussed with the patient that there may be a patient responsible charge related to this service. The patient expressed understanding and wishes to proceed.  Provider location is office. Patient location is at their home. People involved in care of the patient during this telehealth encounter were myself, my nurse/medical assistant, and my front office/scheduling team member.  CC: rash  HPI:  Sara Sanford is a pleasant 64 year old female presenting today with reports of a rash that developed 2 days ago. Rash described as red welts and is located on bilateral arms and torso.  Some blistering on arms.  Was very itchy yesterday but has improved today.  Started taking nitrofurantoin on Friday evening for an E. coli UTI.  Rash appeared on Saturday evening.  Also reports was "very sick on Saturday, could not get out of bed".  Now she has diarrhea and a decreased appetite.  She did not stop taking the nitrofurantoin.  No changes in perfumes, soaps, detergents, fabric softeners, or cosmetics.  Review of Systems: No fevers, chills, night sweats, weight loss, chest pain, or shortness of breath.   Objective Findings:    General: Speaking full sentences, no audible heavy breathing.  Sounds alert and appropriately interactive.    Independent interpretation of tests performed by another provider:   None.  Impression and Recommendations:    Recurrent  UTI Discontinue nitrofurantoin. Keflex 500 mg 3 times daily x5 days.  Rash and nonspecific skin eruption Suspect drug reaction vs. Contact dermatitis. Diagnosis limited by inability to assess. Discontinue nitrofurantion and add to allergy list. Offered burst dose of prednisone, patient declined. Hydroxyzine 25-50mg  3 times daily as needed for itching. May use cool compresses for comfort. If no improvement, may need in office evaluation.   I discussed the above assessment and treatment plan with the patient. The patient was provided an opportunity to ask questions and all were answered. The patient agreed with the plan and demonstrated an understanding of the instructions.   The patient was advised to call back or seek an in-person evaluation if the symptoms worsen or if the condition fails to improve as anticipated.   35 minutes of non-face-to-face time was provided during this encounter.  Clearnce Sorrel, DNP, APRN, FNP-BC Primary [facility] and [facility]

## 2020-01-03 NOTE — Assessment & Plan Note (Addendum)
Suspect new allergy to nitrofurantoin, added to allergy list.  Offered burst dose of prednisone, patient declined.  Hydroxyzine 25 to 50 mg 3 times daily as needed for itching.  May use cool compresses for comfort.

## 2020-01-03 NOTE — Assessment & Plan Note (Signed)
Suspect drug reaction vs. Contact dermatitis. Diagnosis limited by inability to assess. Discontinue nitrofurantion and add to allergy list. Offered burst dose of prednisone, patient declined. Hydroxyzine 25-50mg  3 times daily as needed for itching. May use cool compresses for comfort. If no improvement, may need in office evaluation.

## 2020-01-20 ENCOUNTER — Other Ambulatory Visit: Payer: Self-pay

## 2020-01-20 ENCOUNTER — Encounter: Payer: Self-pay | Admitting: *Deleted

## 2020-01-20 ENCOUNTER — Ambulatory Visit (INDEPENDENT_AMBULATORY_CARE_PROVIDER_SITE_OTHER): Payer: Medicare Other | Admitting: Family Medicine

## 2020-01-20 ENCOUNTER — Encounter: Payer: Self-pay | Admitting: Family Medicine

## 2020-01-20 VITALS — BP 113/54 | HR 80 | Ht 62.0 in | Wt 244.0 lb

## 2020-01-20 DIAGNOSIS — E782 Mixed hyperlipidemia: Secondary | ICD-10-CM

## 2020-01-20 DIAGNOSIS — F411 Generalized anxiety disorder: Secondary | ICD-10-CM

## 2020-01-20 DIAGNOSIS — M25562 Pain in left knee: Secondary | ICD-10-CM | POA: Diagnosis not present

## 2020-01-20 DIAGNOSIS — E1169 Type 2 diabetes mellitus with other specified complication: Secondary | ICD-10-CM | POA: Insufficient documentation

## 2020-01-20 DIAGNOSIS — G8929 Other chronic pain: Secondary | ICD-10-CM | POA: Diagnosis not present

## 2020-01-20 DIAGNOSIS — E785 Hyperlipidemia, unspecified: Secondary | ICD-10-CM | POA: Insufficient documentation

## 2020-01-20 HISTORY — DX: Hyperlipidemia, unspecified: E78.5

## 2020-01-20 NOTE — Progress Notes (Signed)
Established Patient Office Visit  Subjective:  Patient ID: Sara Sanford, female    DOB: [birth year]  Age: 64 y.o. MRN: [id number]  CC:  Chief Complaint  Patient presents with  . mood    HPI Sara Sanford presents for   F/U GAD -she is currently on fluoxetine 20 mg daily.  She is happy with her current regimen.  She says sometimes she gets a little tearful but she feels like it is really just more frustration with her leg and knee pain.  Unfortunately, she still having persistent left knee pain.  It is really starting to limit her activity.  She said to have her husband do the grocery shopping and she is only able to clean for small amount of time and then she has to sit down and rest.  She just bought a special cooling pack for it.  She saw Dr. Dianah Field in December and received a steroid injection which was helpful for about 2 weeks.  She was hoping that her insurance would cover Synvisc injections but it would not.  He was also recently seen about 2 weeks ago for rash on her arms and her torso.  She had been placed on nitrofurantoin for possible UTI.  So this was discontinued and added to her allergy list.    Past Medical History:  Diagnosis Date  . Allergy   . Anxiety   . Arthritis   . Chronic lymphocytic leukemia ([place])   . Gallstones   . GERD (gastroesophageal reflux disease)   . Heart burn    peptic ulcer  . Kidney stones   . Lichen sclerosus   . Obesity   . Post-menopausal   . UTI (urinary tract infection)     Past Surgical History:  Procedure Laterality Date  . CARDIAC CATHETERIZATION  2008  . CHOLECYSTECTOMY    . COLONOSCOPY  2010   [place], [place]  . DILATION AND CURETTAGE OF UTERUS    . ESOPHAGOGASTRODUODENOSCOPY  2005   [place], [place] had egd and colon at the same time   . GALLBLADDER SURGERY  2015  . HYSTEROSCOPY WITH D & C  2008  . [place]  . UPPER GASTROINTESTINAL ENDOSCOPY      Family History  Problem Relation Age  of Onset  . Breast cancer Paternal Aunt        pm breast CA  . Breast cancer Paternal Aunt        PM breast CA  . Heart attack Father   . Hypertension Father   . Colon polyps Father   . Hypertension Mother   . Stomach cancer Maternal Grandmother   . Colon cancer Neg Hx   . Rectal cancer Neg Hx     Social History   Socioeconomic History  . Marital status: Married    Spouse name: Not on file  . Number of children: 2  . Years of education: Not on file  . Highest education level: Not on file  Occupational History  . Occupation: Cytogeneticist: [employer]  Tobacco Use  . Smoking status: Never Smoker  . Smokeless tobacco: Never Used  Substance and Sexual Activity  . Alcohol use: No  . Drug use: Yes    Types: Heroin  . Sexual activity: Yes    Partners: Male  Other Topics Concern  . Not on file  Social History Narrative   2 mile walk 4 times a week.     Social Determinants  of Health   Financial Resource Strain:   . Difficulty of Paying Living Expenses: Not on file  Food Insecurity:   . Worried About Charity fundraiser in the Last Year: Not on file  . Ran Out of Food in the Last Year: Not on file  Transportation Needs:   . Lack of Transportation (Medical): Not on file  . Lack of Transportation (Non-Medical): Not on file  Physical Activity:   . Days of Exercise per Week: Not on file  . Minutes of Exercise per Session: Not on file  Stress:   . Feeling of Stress : Not on file  Social Connections:   . Frequency of Communication with Friends and Family: Not on file  . Frequency of Social Gatherings with Friends and Family: Not on file  . Attends Religious Services: Not on file  . Active Member of Clubs or Organizations: Not on file  . Attends Archivist Meetings: Not on file  . Marital Status: Not on file  Intimate Partner Violence:   . Fear of Current or Ex-Partner: Not on file  . Emotionally Abused: Not on file  . Physically Abused: Not on file  .  Sexually Abused: Not on file    Outpatient Medications Prior to Visit  Medication Sig Dispense Refill  . clobetasol cream (TEMOVATE) 0.05 % APPLY  CREAM TWICE TO THREE TIMES A WEEK AT  NIGHT  AS  DIRECTED 60 g 0  . FLUoxetine (PROZAC) 20 MG capsule Take 1 capsule by mouth once daily 90 capsule 0  . folic acid (FOLVITE) 1 MG tablet Take 2 tablets (2 mg total) by mouth daily. 180 tablet 3  . omeprazole (PRILOSEC) 20 MG capsule Take one cap each morning 30 minutes before food.  Take one cap (before eating) each evening for one week. 37 capsule 1  . vitamin B-12 (CYANOCOBALAMIN) 1000 MCG tablet Take 1,000 mcg by mouth daily.    . hydrOXYzine (ATARAX/VISTARIL) 25 MG tablet Take 1-2 tablets (25-50 mg total) by mouth 3 (three) times daily as needed for itching. 30 tablet 0   No facility-administered medications prior to visit.    Allergies  Allergen Reactions  . Propoxyphene Anaphylaxis, Shortness Of Breath and Other (See Comments)    Hallucinations   . Nitrofurantoin Diarrhea and Rash    Welts with blisters    ROS Review of Systems    Objective:    Physical Exam  Constitutional: She is oriented to person, place, and time. She appears well-developed and well-nourished.  HENT:  Head: Normocephalic and atraumatic.  Cardiovascular: Normal rate, regular rhythm and normal heart sounds.  Pulmonary/Chest: Effort normal and breath sounds normal.  Neurological: She is alert and oriented to person, place, and time.  Skin: Skin is warm and dry.  Psychiatric: She has a normal mood and affect. Her behavior is normal.    BP (!) 113/54   Pulse 80   Ht 5\' 2"  (1.575 m)   Wt 244 lb (110.7 kg)   SpO2 100%   BMI 44.63 kg/m  Wt Readings from Last 3 Encounters:  01/20/20 244 lb (110.7 kg)  12/31/19 240 lb (108.9 kg)  12/28/19 247 lb (112 kg)     Health Maintenance Due  Topic Date Due  . FOOT EXAM  08/18/1964  . OPHTHALMOLOGY EXAM  08/18/1964  . DEXA SCAN  08/19/2019  . HEMOGLOBIN A1C   12/31/2019    There are no preventive care reminders to display for this patient.  Lab Results  Component Value Date   TSH 1.98 08/09/2019   Lab Results  Component Value Date   WBC 82.0 (HH) 12/07/2019   HGB 11.0 (L) 12/07/2019   HCT 34.1 (L) 12/07/2019   MCV 100.3 (H) 12/07/2019   PLT 243 12/07/2019   Lab Results  Component Value Date   NA 142 12/07/2019   K 3.7 12/07/2019   CO2 27 12/07/2019   GLUCOSE 88 12/07/2019   BUN 13 12/07/2019   CREATININE 0.74 12/07/2019   BILITOT 0.5 12/07/2019   ALKPHOS 96 12/07/2019   AST 18 12/07/2019   ALT 15 12/07/2019   PROT 6.0 (L) 12/07/2019   ALBUMIN 4.1 12/07/2019   CALCIUM 8.9 12/07/2019   ANIONGAP 7 12/07/2019   Lab Results  Component Value Date   CHOL 218 (H) 08/12/2018   Lab Results  Component Value Date   HDL 47 (L) 08/12/2018   Lab Results  Component Value Date   LDLCALC 151 (H) 08/12/2018   Lab Results  Component Value Date   TRIG 93 08/12/2018   Lab Results  Component Value Date   CHOLHDL 4.6 08/12/2018   Lab Results  Component Value Date   HGBA1C 5.4 06/30/2019      Assessment & Plan:   Problem List Items Addressed This Visit      Other   Hyperlipidemia    Not currently on prescription medication.  But due to recheck lipid levels.      Relevant Orders   COMPLETE METABOLIC PANEL WITH GFR   Lipid panel   GAD (generalized anxiety disorder) - Primary    Happy with her current regimen.  Continue with fluoxetine for now.       Other Visit Diagnoses    Chronic pain of left knee         Left chronic knee pain-discussed checking with her insurance since is the new year to see if they will cover either Synvisc or Orthovisc injections.  If not encouraged her follow back up with Dr. Darene Lamer.  I really think she is probably at the point of needing knee replacement but she has been very hesitant to have surgery.  She is tried topical anti-inflammatories and has tried to stay away from oral NSAIDs because of  her renal function.  Last kidney function was okay so okay to use an occasional anti-inflammatory like Aleve or ibuprofen.  No orders of the defined types were placed in this encounter.   Follow-up: Return in about 1 year (around 01/19/2021) for anxiety medication.    Beatrice Lecher, MD

## 2020-01-20 NOTE — Assessment & Plan Note (Signed)
Happy with her current regimen.  Continue with fluoxetine for now.

## 2020-01-20 NOTE — Assessment & Plan Note (Signed)
Not currently on prescription medication.  But due to recheck lipid levels.

## 2020-01-26 ENCOUNTER — Encounter: Payer: Self-pay | Admitting: Gastroenterology

## 2020-01-26 ENCOUNTER — Other Ambulatory Visit: Payer: Self-pay

## 2020-01-26 ENCOUNTER — Ambulatory Visit (INDEPENDENT_AMBULATORY_CARE_PROVIDER_SITE_OTHER): Payer: Medicare Other | Admitting: Gastroenterology

## 2020-01-26 VITALS — BP 108/62 | HR 78 | Temp 97.3°F | Ht 63.0 in | Wt 244.4 lb

## 2020-01-26 DIAGNOSIS — K580 Irritable bowel syndrome with diarrhea: Secondary | ICD-10-CM

## 2020-01-26 DIAGNOSIS — K219 Gastro-esophageal reflux disease without esophagitis: Secondary | ICD-10-CM

## 2020-01-26 DIAGNOSIS — E538 Deficiency of other specified B group vitamins: Secondary | ICD-10-CM | POA: Diagnosis not present

## 2020-01-26 DIAGNOSIS — K222 Esophageal obstruction: Secondary | ICD-10-CM | POA: Diagnosis not present

## 2020-01-26 NOTE — Progress Notes (Signed)
P  Chief Complaint:    Procedure follow-up, GERD  GI History: 64 y.o. female with a history of stage 0 CLL, referred to the Gastroenterology Clinic in 09/2019 for evaluation of mild microcytic anemia along with routine CRC screen.  Hemoglobin ~11 with MCV in the low 100s for the last few months (hemoglobin 13 with MCV 93 in 2015).  Normal PLT.  Normal CMP (protein 6.4).  Ferritin 18, iron 86, TIBC 361, sat 24%.  B12 borderline low at 399, and started on B12 supplement 08/2019.   Normal folate.  She denies any hematochezia, melena, abdominal pain, n/v/d/c/f/c.   Separately, hx of reflux, characterized by HB, regurgitation. Worse at night. Controlled with Prilosec 20 mg/day for years. Had trialed Zantac briefly with no relief, then resumed Prilosec. Intermittent dysphagia with cold and carbonated beverages for years. No odynophagia. No issue with warm beverages.   Resolved with EGD with dilation 12/2019.  CT 08/2019 with incidental finding of mildly lobular hepatic contours with fissural widening without lesions.  No duct dilatation.  No portal hypertension.  Otherwise normal GI tract.  MGM with Stomach CA. No Fhx of CRC, IBD.  Endoscopic history: -EGD (12/2019, Dr. 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 pylori gastritis, fundic gland polyps, normal duodenum (biopsies normal) -Colonoscopy (12/2019, Dr. Bryan Lemma): Normal colon, narrow rectal vault (no retroflexion), normal TI.  Repeat 10 years -Colonoscopy 2010- normal per patient - EGD 2005- normal per patient - Colonoscopy 20005- normal per patient  HPI:    Patient is a 64 y.o. female presenting to the Gastroenterology Clinic for follow-up.  EGD/colonoscopy completed in 12/2019 as outlined above.  Was seen by Dr. Maylon Peppers in 11/2019-continued B12 supplement and added folic acid.  Hemoglobin stable at 11.0.  Other than protein 6.0, normal CMP in 11/2019. Planning on f/u with repeat  labs in Heme clinic Q3 months.   Today, states her dysphagia has resolved with EGD with TTS dilation. Reflux still well controlled with omeprazole, with rare Tums with dietary indiscretion (spicy foods).   Does report long history of loose, watery stools, typically post prandial. Independent of food types. Will use Imodium when traveling.  No recent changes.  Review of systems:     No chest pain, no SOB, no fevers, no urinary sx   Past Medical History:  Diagnosis Date  . Allergy   . Anxiety   . Arthritis   . Chronic lymphocytic leukemia ([place])   . Gallstones   . GERD (gastroesophageal reflux disease)   . Heart burn    peptic ulcer  . Kidney stones   . Lichen sclerosus   . Obesity   . Post-menopausal   . UTI (urinary tract infection)     Patient's surgical history, family medical history, social history, medications and allergies were all reviewed in Epic    Current Outpatient Medications  Medication Sig Dispense Refill  . clobetasol cream (TEMOVATE) 0.05 % APPLY  CREAM TWICE TO THREE TIMES A WEEK AT  NIGHT  AS  DIRECTED 60 g 0  . FLUoxetine (PROZAC) 20 MG capsule Take 1 capsule by mouth once daily 90 capsule 0  . folic acid (FOLVITE) 1 MG tablet Take 2 tablets (2 mg total) by mouth daily. 180 tablet 3  . omeprazole (PRILOSEC) 20 MG capsule Take one cap each morning 30 minutes before food.  Take one cap (before eating) each evening for one week. 37 capsule 1  . vitamin B-12 (CYANOCOBALAMIN)  1000 MCG tablet Take 1,000 mcg by mouth daily.     No current facility-administered medications for this visit.    Physical Exam:     BP 108/62   Pulse 78   Temp (!) 97.3 F (36.3 C)   Ht 5\' 3"  (1.6 m)   Wt 244 lb 6 oz (110.8 kg)   BMI 43.29 kg/m   GENERAL:  Pleasant female in NAD PSYCH: : Cooperative, normal affect EENT:  conjunctiva pink, mucous membranes moist, neck supple without masses CARDIAC:  RRR, no murmur heard, no peripheral edema PULM: Normal respiratory effort,  lungs CTA bilaterally, no wheezing ABDOMEN:  Nondistended, soft, nontender. No obvious masses, no hepatomegaly,  normal bowel sounds SKIN:  turgor, no lesions seen Musculoskeletal:  Normal muscle tone, normal strength NEURO: Alert and oriented x 3, no focal neurologic deficits   IMPRESSION and PLAN:    1) GERD -Reflux symptoms well controlled on current aspiration therapy -No change to medical regimen -Resume antireflux lifestyle/dietary modifications  2) Peptic stricture 3) Dysphagia -Dysphagia essentially resolved with EGD with TTS balloon dilation 12/2019.  Tolerating all p.o. intake  4) IBS-D -Longstanding history of loose, nonbloody stools.  Generally well controlled, with intermittent use of Imodium as needed, particularly for travel -Increase dietary fiber and add fiber supplement. -Okay to use Imodium situationally  5) Macrocytic anemia: -Mild B12 deficiency and iron insufficiency -Hemoglobin stable -Taking B12 and folate.  Follows in the Hematology clinic -Normal duodenum with normal small bowel biopsies on EGD and normal-appearing TI on colonoscopy  RTC as needed          Lavena Bullion ,DO, FACG 01/26/2020, 11:46 AM

## 2020-01-26 NOTE — Patient Instructions (Signed)
Please start taking a daily fiber supplement such as citrucel, benefiber, metamucil, or fiber choice.   Follow up as needed.  It was a pleasure to see you today!  Vito Cirigliano, D.O.

## 2020-01-28 ENCOUNTER — Encounter: Payer: Self-pay | Admitting: Sports Medicine

## 2020-01-28 ENCOUNTER — Ambulatory Visit (INDEPENDENT_AMBULATORY_CARE_PROVIDER_SITE_OTHER): Payer: Medicare Other | Admitting: Sports Medicine

## 2020-01-28 ENCOUNTER — Other Ambulatory Visit: Payer: Self-pay

## 2020-01-28 DIAGNOSIS — M1712 Unilateral primary osteoarthritis, left knee: Secondary | ICD-10-CM | POA: Diagnosis not present

## 2020-01-28 MED ORDER — TRAMADOL HCL 50 MG PO TABS: 50.0000 mg | ORAL_TABLET | Freq: Three times a day (TID) | ORAL | 0 refills | Status: DC | PRN

## 2020-01-28 NOTE — Progress Notes (Signed)
    Procedures performed today:    None.  Independent interpretation of tests performed by another provider:   None.  Impression and Recommendations:    Primary osteoarthritis of left knee Sara Sanford returns, she is a pleasant 64 year old female, she has knee osteoarthritis, we tried a left knee steroid injection at the last visit back in December. She only had relief for a couple of weeks and then full return of pain. Synvisc was her preferred viscosupplementation out-of-pocket cost was going to be too high. At this point I would like her to touch base with [name] to discuss knee arthroplasty, and I am going to add a bit of tramadol to use in the meantime.    ___________________________________________ Gwen Her. Dianah Field, M.D., ABFM., CAQSM. Primary Care and [place] Instructor of [place] [facility]

## 2020-01-28 NOTE — Assessment & Plan Note (Signed)
Sara Sanford returns, she is a pleasant 64 year old female, she has knee osteoarthritis, we tried a left knee steroid injection at the last visit back in December. She only had relief for a couple of weeks and then full return of pain. Synvisc was her preferred viscosupplementation out-of-pocket cost was going to be too high. At this point I would like her to touch base with [name] to discuss knee arthroplasty, and I am going to add a bit of tramadol to use in the meantime.

## 2020-02-02 LAB — COMPLETE METABOLIC PANEL WITH GFR
AG Ratio: 2.4 (calc) (ref 1.0–2.5)
ALT: 14 U/L (ref 6–29)
AST: 20 U/L (ref 10–35)
Albumin: 4 g/dL (ref 3.6–5.1)
Alkaline phosphatase (APISO): 87 U/L (ref 37–153)
BUN: 13 mg/dL (ref 7–25)
CO2: 27 mmol/L (ref 20–32)
Calcium: 8.6 mg/dL (ref 8.6–10.4)
Chloride: 107 mmol/L (ref 98–110)
Creat: 0.69 mg/dL (ref 0.50–0.99)
GFR, Est African American: 106 mL/min/{1.73_m2} (ref >=60)
GFR, Est Non African American: 91 mL/min/{1.73_m2} (ref >=60)
Globulin: 1.7 g/dL (calc) — ABNORMAL LOW (ref 1.9–3.7)
Glucose, Bld: 91 mg/dL (ref 65–99)
Potassium: 4.3 mmol/L (ref 3.5–5.3)
Sodium: 142 mmol/L (ref 135–146)
Total Bilirubin: 0.7 mg/dL (ref 0.2–1.2)
Total Protein: 5.7 g/dL — ABNORMAL LOW (ref 6.1–8.1)

## 2020-02-02 LAB — LIPID PANEL
Cholesterol: 206 mg/dL — ABNORMAL HIGH (ref <200)
HDL: 38 mg/dL — ABNORMAL LOW (ref >=50)
LDL Cholesterol (Calc): 143 mg/dL (calc) — ABNORMAL HIGH
Non-HDL Cholesterol (Calc): 168 mg/dL (calc) — ABNORMAL HIGH (ref <130)
Total CHOL/HDL Ratio: 5.4 (calc) — ABNORMAL HIGH (ref <5.0)
Triglycerides: 124 mg/dL (ref <150)

## 2020-02-14 ENCOUNTER — Encounter: Payer: Self-pay | Admitting: Nurse Practitioner

## 2020-02-14 ENCOUNTER — Ambulatory Visit (INDEPENDENT_AMBULATORY_CARE_PROVIDER_SITE_OTHER): Payer: Medicare Other | Admitting: Nurse Practitioner

## 2020-02-14 ENCOUNTER — Other Ambulatory Visit: Payer: Self-pay

## 2020-02-14 VITALS — BP 112/71 | HR 66 | Temp 98.1°F | Ht 63.0 in | Wt 237.0 lb

## 2020-02-14 DIAGNOSIS — N3000 Acute cystitis without hematuria: Secondary | ICD-10-CM | POA: Diagnosis not present

## 2020-02-14 DIAGNOSIS — R300 Dysuria: Secondary | ICD-10-CM

## 2020-02-14 LAB — POCT URINALYSIS DIP (CLINITEK)
Bilirubin, UA: NEGATIVE
Blood, UA: NEGATIVE
Glucose, UA: NEGATIVE mg/dL
Ketones, POC UA: NEGATIVE mg/dL
Nitrite, UA: POSITIVE — AB
POC PROTEIN,UA: NEGATIVE
Spec Grav, UA: 1.025 (ref 1.010–1.025)
Urobilinogen, UA: 0.2 E.U./dL
pH, UA: 6 (ref 5.0–8.0)

## 2020-02-14 MED ORDER — CEPHALEXIN 500 MG PO CAPS: 500.0000 mg | ORAL_CAPSULE | Freq: Two times a day (BID) | ORAL | 0 refills | Status: DC

## 2020-02-14 MED ORDER — FLUCONAZOLE 150 MG PO TABS: 150.0000 mg | ORAL_TABLET | Freq: Every day | ORAL | 1 refills | Status: DC

## 2020-02-14 MED ORDER — SULFAMETHOXAZOLE-TRIMETHOPRIM 400-80 MG PO TABS: 1.0000 | ORAL_TABLET | Freq: Every day | ORAL | 1 refills | Status: DC

## 2020-02-14 NOTE — Progress Notes (Signed)
Acute Office Visit  Subjective:    Patient ID: Sara Sanford, female    DOB: 1956-02-27, 64 y.o.   MRN: [id number]  Chief Complaint  Patient presents with  . Dysuria    HPI Patient is in today for symptoms of urinary tract infection inlcuding, dysuria, burning, urinary frequency, decreased output, low back pain, urinary urgency and incontinence, and incomplete voiding since Friday 02/11/20. She started taking Azo over the weekend which helped with her symptoms dramatically.   She has a history of frequent UTI's and has had success with nitrofurantoin until January of this year when she developed an allergic reaction while taking the medication. She was switched to Keflex and tolerated the medication well.   She reports in October of last year her PCP started her on Bactrim as a daily prophylactic medication for UTI's. At that time she had just been diagnosed with CLL and did not want to start a new medication if she did not have to. She reports that she did not take the medication, but still has the prescription at home and would like to start it now if it is ok.  Past Medical History:  Diagnosis Date  . Allergy   . Anxiety   . Arthritis   . Chronic lymphocytic leukemia ([place])   . Gallstones   . GERD (gastroesophageal reflux disease)   . Heart burn    peptic ulcer  . Kidney stones   . Lichen sclerosus   . Obesity   . Post-menopausal   . UTI (urinary tract infection)     Past Surgical History:  Procedure Laterality Date  . CARDIAC CATHETERIZATION  2008  . CHOLECYSTECTOMY    . COLONOSCOPY  2010   [place], [place]  . DILATION AND CURETTAGE OF UTERUS    . ESOPHAGOGASTRODUODENOSCOPY  2005   [place], [place] had egd and colon at the same time   . GALLBLADDER SURGERY  2015  . HYSTEROSCOPY WITH D & C  2008  . [place]  . UPPER GASTROINTESTINAL ENDOSCOPY      Family History  Problem Relation Age of Onset  . Breast cancer Paternal Aunt        pm breast CA   . Breast cancer Paternal Aunt        PM breast CA  . Heart attack Father   . Hypertension Father   . Colon polyps Father   . Hypertension Mother   . Stomach cancer Maternal Grandmother   . Colon cancer Neg Hx   . Rectal cancer Neg Hx     Social History   Socioeconomic History  . Marital status: Married    Spouse name: Not on file  . Number of children: 2  . Years of education: Not on file  . Highest education level: Not on file  Occupational History  . Occupation: Cytogeneticist: [employer]  Tobacco Use  . Smoking status: Never Smoker  . Smokeless tobacco: Never Used  Substance and Sexual Activity  . Alcohol use: No  . Drug use: Never  . Sexual activity: Yes    Partners: Male  Other Topics Concern  . Not on file  Social History Narrative   2 mile walk 4 times a week.     Social Determinants of Health   Financial Resource Strain:   . Difficulty of Paying Living Expenses: Not on file  Food Insecurity:   . Worried About Charity fundraiser in the Last Year: Not  on file  . Ran Out of Food in the Last Year: Not on file  Transportation Needs:   . Lack of Transportation (Medical): Not on file  . Lack of Transportation (Non-Medical): Not on file  Physical Activity:   . Days of Exercise per Week: Not on file  . Minutes of Exercise per Session: Not on file  Stress:   . Feeling of Stress : Not on file  Social Connections:   . Frequency of Communication with Friends and Family: Not on file  . Frequency of Social Gatherings with Friends and Family: Not on file  . Attends Religious Services: Not on file  . Active Member of Clubs or Organizations: Not on file  . Attends Archivist Meetings: Not on file  . Marital Status: Not on file  Intimate Partner Violence:   . Fear of Current or Ex-Partner: Not on file  . Emotionally Abused: Not on file  . Physically Abused: Not on file  . Sexually Abused: Not on file    Outpatient Medications Prior to Visit   Medication Sig Dispense Refill  . clobetasol cream (TEMOVATE) 0.05 % APPLY  CREAM TWICE TO THREE TIMES A WEEK AT  NIGHT  AS  DIRECTED 60 g 0  . FLUoxetine (PROZAC) 20 MG capsule Take 1 capsule by mouth once daily 90 capsule 0  . folic acid (FOLVITE) 1 MG tablet Take 2 tablets (2 mg total) by mouth daily. 180 tablet 3  . omeprazole (PRILOSEC) 20 MG capsule Take one cap each morning 30 minutes before food.  Take one cap (before eating) each evening for one week. 37 capsule 1  . vitamin B-12 (CYANOCOBALAMIN) 1000 MCG tablet Take 1,000 mcg by mouth daily.    . traMADol (ULTRAM) 50 MG tablet Take 1-2 tablets (50-100 mg total) by mouth every 8 (eight) hours as needed for moderate pain. Maximum 6 tabs per day. (Patient not taking: Reported on 02/14/2020) 21 tablet 0   No facility-administered medications prior to visit.    Allergies  Allergen Reactions  . Propoxyphene Anaphylaxis, Shortness Of Breath and Other (See Comments)    Hallucinations   . Nitrofurantoin Diarrhea and Rash    Welts with blisters    Review of Systems  Constitutional: Negative for activity change, appetite change, chills, fatigue and fever.  Gastrointestinal: Negative for abdominal pain, constipation, diarrhea, nausea and vomiting.  Genitourinary: Positive for decreased urine volume, dysuria, flank pain, frequency, pelvic pain and urgency. Negative for difficulty urinating, dyspareunia, genital sores, vaginal bleeding, vaginal discharge and vaginal pain.  Neurological: Negative for dizziness, weakness, light-headedness and headaches.  Psychiatric/Behavioral: Negative for confusion.       Objective:    Physical Exam Vitals and nursing note reviewed.  Constitutional:      Appearance: Normal appearance.  HENT:     Head: Normocephalic.  Cardiovascular:     Rate and Rhythm: Normal rate and regular rhythm.     Pulses: Normal pulses.     Heart sounds: Normal heart sounds.  Pulmonary:     Effort: Pulmonary effort is  normal.     Breath sounds: Normal breath sounds.  Abdominal:     General: Bowel sounds are normal. There is no distension.     Palpations: Abdomen is soft. There is no mass.     Tenderness: There is abdominal tenderness in the suprapubic area. There is right CVA tenderness and left CVA tenderness. There is no guarding or rebound.     Hernia: No hernia is present.  Skin:    General: Skin is warm and dry.     Capillary Refill: Capillary refill takes less than 2 seconds.  Neurological:     General: No focal deficit present.     Mental Status: She is alert and oriented to person, place, and time.  Psychiatric:        Mood and Affect: Mood normal.        Behavior: Behavior normal.        Thought Content: Thought content normal.        Judgment: Judgment normal.    BP 112/71   Pulse 66   Temp 98.1 F (36.7 C) (Oral)   Ht 5\' 3"  (1.6 m)   Wt 237 lb (107.5 kg)   SpO2 96%   BMI 41.98 kg/m  Wt Readings from Last 3 Encounters:  02/14/20 237 lb (107.5 kg)  01/28/20 242 lb (109.8 kg)  01/26/20 244 lb 6 oz (110.8 kg)    Health Maintenance Due  Topic Date Due  . FOOT EXAM  08/18/1964  . OPHTHALMOLOGY EXAM  08/18/1964  . URINE MICROALBUMIN  08/18/1964  . DEXA SCAN  08/19/2019  . HEMOGLOBIN A1C  12/31/2019    There are no preventive care reminders to display for this patient.   Lab Results  Component Value Date   TSH 1.98 08/09/2019   Lab Results  Component Value Date   WBC 82.0 (HH) 12/07/2019   HGB 11.0 (L) 12/07/2019   HCT 34.1 (L) 12/07/2019   MCV 100.3 (H) 12/07/2019   PLT 243 12/07/2019   Lab Results  Component Value Date   NA 142 02/01/2020   K 4.3 02/01/2020   CO2 27 02/01/2020   GLUCOSE 91 02/01/2020   BUN 13 02/01/2020   CREATININE 0.69 02/01/2020   BILITOT 0.7 02/01/2020   ALKPHOS 96 12/07/2019   AST 20 02/01/2020   ALT 14 02/01/2020   PROT 5.7 (L) 02/01/2020   ALBUMIN 4.1 12/07/2019   CALCIUM 8.6 02/01/2020   ANIONGAP 7 12/07/2019   Lab Results   Component Value Date   CHOL 206 (H) 02/01/2020   Lab Results  Component Value Date   HDL 38 (L) 02/01/2020   Lab Results  Component Value Date   LDLCALC 143 (H) 02/01/2020   Lab Results  Component Value Date   TRIG 124 02/01/2020   Lab Results  Component Value Date   CHOLHDL 5.4 (H) 02/01/2020   Lab Results  Component Value Date   HGBA1C 5.4 06/30/2019       Assessment & Plan:   1. Acute cystitis without hematuria POCT urinalysis positive for nitrites and leukocytes. Symptoms, presentation,and lab findings consistent with recurrent cystitis.  Based on last urine culture, will send 7 day course of Keflex to pharmacy. In addition, will re-order daily bactrim as prophylaxis treatment for recurrent UTI's to begin once Keflex has been completed.  Will also obtain culture for today.  Discussed with patient the importance of taking Bactrim daily and monitoring closely for signs and symptoms of infection. Patient to notify us if she begins to experience s/s of recurrent infection. Diflucan sent to pharmacy due to vaginal yeast infection with last dose of Keflex.   2. Dysuria Culture pending.  - POCT URINALYSIS DIP (CLINITEK) - Urine Culture  Return if symptoms worsen or fail to improve.   Orma Render, NP

## 2020-02-14 NOTE — Patient Instructions (Signed)
Urinary Tract Infection, Adult A urinary tract infection (UTI) is an infection of any part of the urinary tract. The urinary tract includes:  The kidneys.  The ureters.  The bladder.  The urethra. These organs make, store, and get rid of pee (urine) in the body. What are the causes? This is caused by germs (bacteria) in your genital area. These germs grow and cause swelling (inflammation) of your urinary tract. What increases the risk? You are more likely to develop this condition if:  You have a small, thin tube (catheter) to drain pee.  You cannot control when you pee or poop (incontinence).  You are female, and: ? You use these methods to prevent pregnancy:  A medicine that kills sperm (spermicide).  A device that blocks sperm (diaphragm). ? You have low levels of a female hormone (estrogen). ? You are pregnant.  You have genes that add to your risk.  You are sexually active.  You take antibiotic medicines.  You have trouble peeing because of: ? A prostate that is bigger than normal, if you are female. ? A blockage in the part of your body that drains pee from the bladder (urethra). ? A kidney stone. ? A nerve condition that affects your bladder (neurogenic bladder). ? Not getting enough to drink. ? Not peeing often enough.  You have other conditions, such as: ? Diabetes. ? A weak disease-fighting system (immune system). ? Sickle cell disease. ? Gout. ? Injury of the spine. What are the signs or symptoms? Symptoms of this condition include:  Needing to pee right away (urgently).  Peeing often.  Peeing small amounts often.  Pain or burning when peeing.  Blood in the pee.  Pee that smells bad or not like normal.  Trouble peeing.  Pee that is cloudy.  Fluid coming from the vagina, if you are female.  Pain in the belly or lower back. Other symptoms include:  Throwing up (vomiting).  No urge to eat.  Feeling mixed up (confused).  Being tired  and grouchy (irritable).  A fever.  Watery poop (diarrhea). How is this treated? This condition may be treated with:  Antibiotic medicine.  Other medicines.  Drinking enough water. Follow these instructions at home:  Medicines  Take over-the-counter and prescription medicines only as told by your doctor.  If you were prescribed an antibiotic medicine, take it as told by your doctor. Do not stop taking it even if you start to feel better. General instructions  Make sure you: ? Pee until your bladder is empty. ? Do not hold pee for a long time. ? Empty your bladder after sex. ? Wipe from front to back after pooping if you are a female. Use each tissue one time when you wipe.  Drink enough fluid to keep your pee pale yellow.  Keep all follow-up visits as told by your doctor. This is important. Contact a doctor if:  You do not get better after 1-2 days.  Your symptoms go away and then come back. Get help right away if:  You have very bad back pain.  You have very bad pain in your lower belly.  You have a fever.  You are sick to your stomach (nauseous).  You are throwing up. Summary  A urinary tract infection (UTI) is an infection of any part of the urinary tract.  This condition is caused by germs in your genital area.  There are many risk factors for a UTI. These include having a small, thin   tube to drain pee and not being able to control when you pee or poop.  Treatment includes antibiotic medicines for germs.  Drink enough fluid to keep your pee pale yellow. This information is not intended to replace advice given to you by your health care provider. Make sure you discuss any questions you have with your health care provider. Document Revised: 11/12/2018 Document Reviewed: 06/04/2018 Elsevier Patient Education  2020 Elsevier Inc.  

## 2020-02-15 LAB — URINE CULTURE
MICRO NUMBER:: 10226381
SPECIMEN QUALITY:: ADEQUATE

## 2020-03-02 ENCOUNTER — Other Ambulatory Visit: Payer: Self-pay | Admitting: Family Medicine

## 2020-03-06 ENCOUNTER — Other Ambulatory Visit: Payer: Self-pay

## 2020-03-06 ENCOUNTER — Telehealth: Payer: Self-pay | Admitting: Hematology

## 2020-03-06 ENCOUNTER — Encounter: Payer: Self-pay | Admitting: Hematology

## 2020-03-06 ENCOUNTER — Telehealth: Payer: Self-pay | Admitting: *Deleted

## 2020-03-06 ENCOUNTER — Inpatient Hospital Stay (HOSPITAL_BASED_OUTPATIENT_CLINIC_OR_DEPARTMENT_OTHER): Payer: Medicare Other | Admitting: Hematology

## 2020-03-06 ENCOUNTER — Inpatient Hospital Stay: Payer: Medicare Other | Attending: Hematology

## 2020-03-06 VITALS — BP 113/50 | HR 64 | Temp 97.3°F | Resp 18 | Ht 63.0 in | Wt 236.8 lb

## 2020-03-06 DIAGNOSIS — D591 Autoimmune hemolytic anemia, unspecified: Secondary | ICD-10-CM | POA: Insufficient documentation

## 2020-03-06 DIAGNOSIS — M542 Cervicalgia: Secondary | ICD-10-CM

## 2020-03-06 DIAGNOSIS — D539 Nutritional anemia, unspecified: Secondary | ICD-10-CM | POA: Insufficient documentation

## 2020-03-06 DIAGNOSIS — R200 Anesthesia of skin: Secondary | ICD-10-CM | POA: Diagnosis not present

## 2020-03-06 DIAGNOSIS — C9110 Chronic lymphocytic leukemia of B-cell type not having achieved remission: Secondary | ICD-10-CM | POA: Insufficient documentation

## 2020-03-06 DIAGNOSIS — Z79899 Other long term (current) drug therapy: Secondary | ICD-10-CM | POA: Diagnosis not present

## 2020-03-06 DIAGNOSIS — Z881 Allergy status to other antibiotic agents status: Secondary | ICD-10-CM | POA: Insufficient documentation

## 2020-03-06 LAB — CBC WITH DIFFERENTIAL (CANCER CENTER ONLY)
Abs Immature Granulocytes: 0.06 10*3/uL (ref 0.00–0.07)
Basophils Absolute: 0.1 10*3/uL (ref 0.0–0.1)
Basophils Relative: 0 %
Eosinophils Absolute: 0.3 10*3/uL (ref 0.0–0.5)
Eosinophils Relative: 0 %
HCT: 31.7 % — ABNORMAL LOW (ref 36.0–46.0)
Hemoglobin: 10.2 g/dL — ABNORMAL LOW (ref 12.0–15.0)
Immature Granulocytes: 0 %
Lymphocytes Relative: 97 %
Lymphs Abs: 76.9 10*3/uL — ABNORMAL HIGH (ref 0.7–4.0)
MCH: 32.9 pg (ref 26.0–34.0)
MCHC: 32.2 g/dL (ref 30.0–36.0)
MCV: 102.3 fL — ABNORMAL HIGH (ref 80.0–100.0)
Monocytes Absolute: 1 10*3/uL (ref 0.1–1.0)
Monocytes Relative: 1 %
Neutro Abs: 1.7 10*3/uL (ref 1.7–7.7)
Neutrophils Relative %: 2 %
Platelet Count: 206 10*3/uL (ref 150–400)
RBC: 3.1 MIL/uL — ABNORMAL LOW (ref 3.87–5.11)
RDW: 14.3 % (ref 11.5–15.5)
WBC Count: 80 10*3/uL (ref 4.0–10.5)
nRBC: 0 % (ref 0.0–0.2)

## 2020-03-06 LAB — DIRECT ANTIGLOBULIN TEST (NOT AT ARMC)
DAT, IgG: POSITIVE
DAT, complement: NEGATIVE

## 2020-03-06 LAB — CMP (CANCER CENTER ONLY)
ALT: 14 U/L (ref 0–44)
AST: 18 U/L (ref 15–41)
Albumin: 4.1 g/dL (ref 3.5–5.0)
Alkaline Phosphatase: 79 U/L (ref 38–126)
Anion gap: 6 (ref 5–15)
BUN: 17 mg/dL (ref 8–23)
CO2: 28 mmol/L (ref 22–32)
Calcium: 9.1 mg/dL (ref 8.9–10.3)
Chloride: 107 mmol/L (ref 98–111)
Creatinine: 0.82 mg/dL (ref 0.44–1.00)
GFR, Est AFR Am: >60 mL/min (ref >60)
GFR, Estimated: >60 mL/min (ref >60)
Glucose, Bld: 91 mg/dL (ref 70–99)
Potassium: 3.8 mmol/L (ref 3.5–5.1)
Sodium: 141 mmol/L (ref 135–145)
Total Bilirubin: 0.5 mg/dL (ref 0.3–1.2)
Total Protein: 6 g/dL — ABNORMAL LOW (ref 6.5–8.1)

## 2020-03-06 LAB — SAVE SMEAR(SSMR), FOR PROVIDER SLIDE REVIEW

## 2020-03-06 LAB — LACTATE DEHYDROGENASE: LDH: 259 U/L — ABNORMAL HIGH (ref 98–192)

## 2020-03-06 LAB — FOLATE: Folate: 45.5 ng/mL (ref >5.9)

## 2020-03-06 LAB — VITAMIN B12: Vitamin B-12: 815 pg/mL (ref 180–914)

## 2020-03-06 NOTE — Telephone Encounter (Signed)
Appointments scheduled calendar printed per 3/29 los 

## 2020-03-06 NOTE — Progress Notes (Signed)
[facility] OFFICE PROGRESS NOTE  Patient Care Team: Sara Marry, MD as PCP - General (Family Medicine)  HEME/ONC OVERVIEW: 1. 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   -However, her Hgb is overall stable and remains above 10, and Tbili is normal, arguing against brisk hemolysis -If her Hgb falls below 10, we will initiate prednisone 1mg /kg to see if her anemia improves   Neck pain -Chronic; etiology unclear -Pain localized to the right submandibular area -Review of CT neck in 08/2019 unremarkable -I have ordered neck US for further evaluation, and  encouraged the patient to follow up with her PCP for further evaluation if pain persists   Orders Placed This Encounter  Procedures  . US Soft Tissue Head/Neck    Standing Status:   Future    Standing Expiration Date:   03/06/2021    Order Specific Question:   Reason for Exam (SYMPTOM  OR DIAGNOSIS REQUIRED)    Answer:   Right submandibular pain, possibly a lymph node    Order Specific Question:   Preferred imaging location?    Answer:   [facility]  . CBC with Differential (Cancer Center Only)    Standing Status:   Future    Standing Expiration Date:   04/10/2021  . CMP ([facility] only)    Standing Status:   Future    Standing Expiration Date:   04/10/2021  . Save Smear ([facility])    Standing Status:   Future    Standing Expiration Date:   03/06/2021  . Ferritin    Standing Status:   Future    Standing Expiration Date:   04/10/2021  . Iron and TIBC    Standing Status:   Future    Standing Expiration Date:   04/10/2021  . Lactate dehydrogenase    Standing Status:   Future    Standing Expiration Date:   04/10/2021  . TSH    Standing Status:   Future    Standing Expiration Date:   03/06/2021  . Reticulocytes    Standing Status:   Future    Standing Expiration Date:   04/10/2021  . Haptoglobin    Standing Status:   Future  Standing Expiration Date:   03/06/2021    The total time spent in the encounter was 35 minutes, including face-to-face time with the patient, review of various tests results, order additional studies/medications, documentation, and coordination of care plan.   All questions were answered. The patient knows to call the clinic with any problems, questions or concerns. No barriers to learning was detected.  Return in 2 months for labs and clinic follow-up.   Tish Men, MD 3/29/202110:29 AM  CHIEF COMPLAINT: "I am doing fine"  INTERVAL HISTORY: [name] returns to clinic for follow-up of CLL on observation.  The patient reports that she has chronic,  intermittent right hand numbness that lasted a few minutes and resolved spontaneously, as well as chronic, mild pain in the right submandibular area for several months.  She has not sought evaluation for the right neck pain in the past.  She denies any constitutional symptoms.  She denies any other complaint today.  REVIEW OF SYSTEMS:   Constitutional: ( - ) fevers, ( - )  chills , ( - ) night sweats Eyes: ( - ) blurriness of vision, ( - ) double vision, ( - ) watery eyes Ears, nose, mouth, throat, and face: ( - ) mucositis, ( - ) sore throat Respiratory: ( - ) cough, ( - ) dyspnea, ( - ) wheezes Cardiovascular: ( - ) palpitation, ( - ) chest discomfort, ( - ) lower extremity swelling Gastrointestinal:  ( - ) nausea, ( - ) heartburn, ( - ) change in bowel habits Skin: ( - ) abnormal skin rashes Lymphatics: ( - ) new lymphadenopathy, ( - ) easy bruising Neurological: ( - ) numbness, ( - ) tingling, ( - ) new weaknesses Behavioral/Psych: ( - ) mood change, ( - ) new changes  All other systems were reviewed with the patient and are negative.  SUMMARY OF ONCOLOGIC HISTORY: Oncology History   No history exists.    I have reviewed the past medical history, past surgical history, social history and family history with the patient and they are unchanged from previous note.  ALLERGIES:  is allergic to propoxyphene and nitrofurantoin.  MEDICATIONS:  Current Outpatient Medications  Medication Sig Dispense Refill  . clobetasol cream (TEMOVATE) 0.05 % APPLY  CREAM TWICE TO THREE TIMES A WEEK AT  NIGHT  AS  DIRECTED 60 g 0  . fluconazole (DIFLUCAN) 150 MG tablet Take 1 tablet (150 mg total) by mouth daily. 1 tablet 1  . FLUoxetine (PROZAC) 20 MG capsule Take 1 capsule by mouth once daily 90 capsule 0  . folic acid (FOLVITE) 1 MG tablet Take 2 tablets (2 mg total) by mouth daily. 180 tablet 3  . omeprazole (PRILOSEC) 20 MG capsule Take one cap each morning 30 minutes before food.  Take one cap  (before eating) each evening for one week. 37 capsule 1  . sulfamethoxazole-trimethoprim (BACTRIM) 400-80 MG tablet Take 1 tablet by mouth daily. 90 tablet 1  . vitamin B-12 (CYANOCOBALAMIN) 1000 MCG tablet Take 1,000 mcg by mouth daily.     No current facility-administered medications for this visit.    PHYSICAL EXAMINATION: ECOG PERFORMANCE STATUS: 1 - Symptomatic but completely ambulatory  Today's Vitals   03/06/20 0950  BP: (!) 113/50  Pulse: 64  Resp: 18  Temp: (!) 97.3 F (36.3 C)  TempSrc: Temporal  SpO2: 100%  Weight: 236 lb 12.8 oz (107.4 kg)  Height: 5\' 3"  (1.6 m)  PainSc: 4    Body mass index  is 41.95 kg/m.  Filed Weights   03/06/20 0950  Weight: 236 lb 12.8 oz (107.4 kg)    GENERAL: alert, no distress and comfortable SKIN: skin color, texture, turgor are normal, no rashes or significant lesions EYES: conjunctiva are pink and non-injected, sclera clear OROPHARYNX: no exudate, no erythema; lips, buccal mucosa, and tongue normal  NECK: supple, non-tender LYMPH:  no palpable lymphadenopathy in the cervical LUNGS: clear to auscultation with normal breathing effort HEART: regular rate & rhythm and no murmurs and no lower extremity edema ABDOMEN: soft, non-tender, non-distended, normal bowel sounds Musculoskeletal: no cyanosis of digits and no clubbing  PSYCH: alert & oriented x 3, fluent speech  LABORATORY DATA:  I have reviewed the data as listed    Component Value Date/Time   NA 141 03/06/2020 0921   K 3.8 03/06/2020 0921   CL 107 03/06/2020 0921   CO2 28 03/06/2020 0921   GLUCOSE 91 03/06/2020 0921   BUN 17 03/06/2020 0921   CREATININE 0.82 03/06/2020 0921   CREATININE 0.69 02/01/2020 1149   CALCIUM 9.1 03/06/2020 0921   PROT 6.0 (L) 03/06/2020 0921   ALBUMIN 4.1 03/06/2020 0921   AST 18 03/06/2020 0921   ALT 14 03/06/2020 0921   ALKPHOS 79 03/06/2020 0921   BILITOT 0.5 03/06/2020 0921   GFRNONAA >60 03/06/2020 0921   GFRNONAA 91 02/01/2020 1149    GFRAA >60 03/06/2020 0921   GFRAA 106 02/01/2020 1149    No results found for: SPEP, UPEP  Lab Results  Component Value Date   WBC 80.0 (HH) 03/06/2020   NEUTROABS 1.7 03/06/2020   HGB 10.2 (L) 03/06/2020   HCT 31.7 (L) 03/06/2020   MCV 102.3 (H) 03/06/2020   PLT 206 03/06/2020      Chemistry      Component Value Date/Time   NA 141 03/06/2020 0921   K 3.8 03/06/2020 0921   CL 107 03/06/2020 0921   CO2 28 03/06/2020 0921   BUN 17 03/06/2020 0921   CREATININE 0.82 03/06/2020 0921   CREATININE 0.69 02/01/2020 1149      Component Value Date/Time   CALCIUM 9.1 03/06/2020 0921   ALKPHOS 79 03/06/2020 0921   AST 18 03/06/2020 0921   ALT 14 03/06/2020 0921   BILITOT 0.5 03/06/2020 0921       RADIOGRAPHIC STUDIES: I have personally reviewed the radiological images as listed below and agreed with the findings in the report. No results found.

## 2020-03-06 NOTE — Telephone Encounter (Signed)
Dr. Maylon Peppers notified of wbc-80.0.  No new orders received at this time.

## 2020-03-07 ENCOUNTER — Other Ambulatory Visit: Payer: Self-pay | Admitting: Hematology

## 2020-03-07 DIAGNOSIS — D539 Nutritional anemia, unspecified: Secondary | ICD-10-CM

## 2020-03-07 DIAGNOSIS — C9110 Chronic lymphocytic leukemia of B-cell type not having achieved remission: Secondary | ICD-10-CM

## 2020-03-07 LAB — HAPTOGLOBIN: Haptoglobin: <10 mg/dL — ABNORMAL LOW (ref 37–355)

## 2020-03-14 ENCOUNTER — Ambulatory Visit (INDEPENDENT_AMBULATORY_CARE_PROVIDER_SITE_OTHER): Payer: Medicare Other

## 2020-03-14 ENCOUNTER — Other Ambulatory Visit: Payer: Self-pay

## 2020-03-14 DIAGNOSIS — M542 Cervicalgia: Secondary | ICD-10-CM

## 2020-03-15 ENCOUNTER — Telehealth: Payer: Self-pay | Admitting: *Deleted

## 2020-03-15 NOTE — Telephone Encounter (Signed)
-----   Message from Tish Men, MD sent at 03/15/2020  7:35 AM EDT ----- Delrae Sawyers, Can you let the patient know that her neck LN's are about the same size as previous CT, and there is no other suspicious finding?Thanks.  Lamar  ----- Message ----- From: Interface, Rad Results In Sent: 03/14/2020   2:47 PM EDT To: Tish Men, MD

## 2020-03-15 NOTE — Telephone Encounter (Signed)
As noted below by Dr. Maylon Peppers, I informed the patient that the lymph nodes in the neck are about the same size as on the previous CT scan, and there are no suspicious findings. She verbalized understanding.

## 2020-03-30 ENCOUNTER — Telehealth: Payer: Self-pay | Admitting: Hematology & Oncology

## 2020-03-30 NOTE — Telephone Encounter (Signed)
Appointments rescheduled from 5/28 [facility] letter/calendar mailed

## 2020-04-11 ENCOUNTER — Telehealth: Payer: Self-pay | Admitting: *Deleted

## 2020-04-11 NOTE — Telephone Encounter (Signed)
While speaking with pt's husband about his own knee issues, he would like for [place] to try another approval for visco supplementation for her.

## 2020-04-12 NOTE — Telephone Encounter (Signed)
I am on board with this.  Left knee only, failed steroid injection, NSAIDs.

## 2020-04-18 NOTE — Telephone Encounter (Signed)
Received Benefits investigation and patient is ready to schedule for Buy and Bill Orthovisc I am going to give to front to have them schedule patient for left knee. - CF

## 2020-04-20 ENCOUNTER — Ambulatory Visit (INDEPENDENT_AMBULATORY_CARE_PROVIDER_SITE_OTHER): Payer: Medicare Other | Admitting: Sports Medicine

## 2020-04-20 ENCOUNTER — Other Ambulatory Visit: Payer: Self-pay

## 2020-04-20 DIAGNOSIS — M1712 Unilateral primary osteoarthritis, left knee: Secondary | ICD-10-CM

## 2020-04-20 NOTE — Assessment & Plan Note (Signed)
Kadeejah returns, Orthovisc No. 1 of 4 injected into the left knee. Tramadol as needed for pain. Return in 1 week for Orthovisc No. 2 of 4 into the left knee.

## 2020-04-20 NOTE — Progress Notes (Signed)
    Procedures performed today:    Procedure: Real-time Ultrasound Guided injection of the left knee Device: Samsung HS60  Verbal informed consent obtained.  Time-out conducted.  Noted no overlying erythema, induration, or other signs of local infection.  Skin prepped in a sterile fashion.  Local anesthesia: Topical Ethyl chloride.  With sterile technique and under real time ultrasound guidance:  30 mg/2 mL of OrthoVisc (sodium hyaluronate) in a prefilled syringe was injected easily into the knee through a 22-gauge needle.   Completed without difficulty  Pain immediately resolved suggesting accurate placement of the medication.  Advised to call if fevers/chills, erythema, induration, drainage, or persistent bleeding.  Images permanently stored and available for review in the ultrasound unit.  Impression: Technically successful ultrasound guided injection.  Independent interpretation of notes and tests performed by another provider:   None.  Brief History, Exam, Impression, and Recommendations:    Primary osteoarthritis of left knee Sara Sanford returns, Orthovisc No. 1 of 4 injected into the left knee. Tramadol as needed for pain. Return in 1 week for Orthovisc No. 2 of 4 into the left knee.    ___________________________________________ Gwen Her. Dianah Field, M.D., ABFM., CAQSM. Primary Care and [place] Instructor of [place] of [facility] of Medicine

## 2020-04-27 ENCOUNTER — Ambulatory Visit (INDEPENDENT_AMBULATORY_CARE_PROVIDER_SITE_OTHER): Payer: Medicare Other | Admitting: Sports Medicine

## 2020-04-27 DIAGNOSIS — M1712 Unilateral primary osteoarthritis, left knee: Secondary | ICD-10-CM | POA: Diagnosis not present

## 2020-04-27 NOTE — Assessment & Plan Note (Signed)
Orthovisc No. 2 of 4 into the left knee, return in 1 week for #3 of 4

## 2020-04-27 NOTE — Progress Notes (Signed)
    Procedures performed today:    Procedure: Real-time Ultrasound Guided injection of the left knee Device: Samsung HS60  Verbal informed consent obtained.  Time-out conducted.  Noted no overlying erythema, induration, or other signs of local infection.  Skin prepped in a sterile fashion.  Local anesthesia: Topical Ethyl chloride.  With sterile technique and under real time ultrasound guidance:  30 mg/2 mL of OrthoVisc (sodium hyaluronate) in a prefilled syringe was injected easily into the knee through a 22-gauge needle.   Completed without difficulty  Pain immediately resolved suggesting accurate placement of the medication.  Advised to call if fevers/chills, erythema, induration, drainage, or persistent bleeding.  Images permanently stored and available for review in the ultrasound unit.  Impression: Technically successful ultrasound guided injection.  Independent interpretation of notes and tests performed by another provider:   None.  Brief History, Exam, Impression, and Recommendations:    Primary osteoarthritis of left knee Orthovisc No. 2 of 4 into the left knee, return in 1 week for #3 of 4    ___________________________________________ Gwen Her. Dianah Field, M.D., ABFM., CAQSM. Primary Care and [place] Instructor of [place] of [facility] of Medicine

## 2020-05-04 ENCOUNTER — Ambulatory Visit (INDEPENDENT_AMBULATORY_CARE_PROVIDER_SITE_OTHER): Payer: Medicare Other | Admitting: Sports Medicine

## 2020-05-04 DIAGNOSIS — M1712 Unilateral primary osteoarthritis, left knee: Secondary | ICD-10-CM | POA: Diagnosis not present

## 2020-05-04 NOTE — Progress Notes (Signed)
    Procedures performed today:    Procedure: Real-time Ultrasound Guided injection of the left knee Device: Samsung HS60  Verbal informed consent obtained.  Time-out conducted.  Noted no overlying erythema, induration, or other signs of local infection.  Skin prepped in a sterile fashion.  Local anesthesia: Topical Ethyl chloride.  With sterile technique and under real time ultrasound guidance: 30 mg/2 mL of OrthoVisc (sodium hyaluronate) in a prefilled syringe was injected easily into the knee through a 22-gauge needle. Completed without difficulty  Pain immediately resolved suggesting accurate placement of the medication.  Advised to call if fevers/chills, erythema, induration, drainage, or persistent bleeding.  Images permanently stored and available for review in the ultrasound unit.  Impression: Technically successful ultrasound guided injection.  Independent interpretation of notes and tests performed by another provider:   None.  Brief History, Exam, Impression, and Recommendations:    Primary osteoarthritis of left knee Orthovisc injection #3 of 4 into the left knee, return in 1 week for #4 of 4.    ___________________________________________ Gwen Her. Dianah Field, M.D., ABFM., CAQSM. Primary Care and [place] Instructor of [place] of [facility] of Medicine

## 2020-05-04 NOTE — Assessment & Plan Note (Signed)
Orthovisc injection #3 of 4 into the left knee, return in 1 week for #4 of 4.

## 2020-05-05 ENCOUNTER — Ambulatory Visit: Payer: Medicare Other | Admitting: Hematology & Oncology

## 2020-05-05 ENCOUNTER — Other Ambulatory Visit: Payer: Medicare Other

## 2020-05-11 ENCOUNTER — Other Ambulatory Visit: Payer: Self-pay

## 2020-05-11 ENCOUNTER — Ambulatory Visit (INDEPENDENT_AMBULATORY_CARE_PROVIDER_SITE_OTHER): Payer: Medicare Other | Admitting: Sports Medicine

## 2020-05-11 DIAGNOSIS — M1712 Unilateral primary osteoarthritis, left knee: Secondary | ICD-10-CM

## 2020-05-11 NOTE — Progress Notes (Signed)
    Procedures performed today:    Procedure: Real-time Ultrasound Guidedinjection of the left knee Device: Samsung HS60  Verbal informed consent obtained.  Time-out conducted.  Noted no overlying erythema, induration, or other signs of local infection.  Skin prepped in a sterile fashion.  Local anesthesia: Topical Ethyl chloride.  With sterile technique and under real time ultrasound guidance: 30 mg/2 mL of OrthoVisc (sodium hyaluronate) in a prefilled syringe was injected easily into the knee through a 22-gauge needle. Completed without difficulty  Pain immediately resolved suggesting accurate placement of the medication.  Advised to call if fevers/chills, erythema, induration, drainage, or persistent bleeding.  Images permanently stored and available for review in the ultrasound unit.  Impression: Technically successful ultrasound guided injection.  Independent interpretation of notes and tests performed by another provider:   None.  Brief History, Exam, Impression, and Recommendations:    Primary osteoarthritis of left knee Orthovisc No. 4 of 4 into the left knee, return in 1 month as needed.    ___________________________________________ Gwen Her. Dianah Field, M.D., ABFM., CAQSM. Primary Care and [place] Instructor of [place] of [facility] of Medicine

## 2020-05-11 NOTE — Assessment & Plan Note (Signed)
Orthovisc No. 4 of 4 into the left knee, return in 1 month as needed.

## 2020-05-15 ENCOUNTER — Encounter: Payer: Self-pay | Admitting: Family

## 2020-05-15 ENCOUNTER — Telehealth: Payer: Self-pay | Admitting: Family

## 2020-05-15 ENCOUNTER — Telehealth: Payer: Self-pay | Admitting: *Deleted

## 2020-05-15 ENCOUNTER — Inpatient Hospital Stay (HOSPITAL_BASED_OUTPATIENT_CLINIC_OR_DEPARTMENT_OTHER): Payer: Medicare Other | Admitting: Family

## 2020-05-15 ENCOUNTER — Inpatient Hospital Stay: Payer: Medicare Other | Attending: Family

## 2020-05-15 ENCOUNTER — Other Ambulatory Visit: Payer: Self-pay

## 2020-05-15 VITALS — BP 114/46 | HR 59 | Temp 97.1°F | Resp 16 | Wt 234.0 lb

## 2020-05-15 DIAGNOSIS — C9110 Chronic lymphocytic leukemia of B-cell type not having achieved remission: Secondary | ICD-10-CM | POA: Diagnosis present

## 2020-05-15 DIAGNOSIS — R630 Anorexia: Secondary | ICD-10-CM | POA: Diagnosis not present

## 2020-05-15 DIAGNOSIS — R6883 Chills (without fever): Secondary | ICD-10-CM | POA: Diagnosis not present

## 2020-05-15 DIAGNOSIS — Z79899 Other long term (current) drug therapy: Secondary | ICD-10-CM | POA: Diagnosis not present

## 2020-05-15 DIAGNOSIS — D649 Anemia, unspecified: Secondary | ICD-10-CM | POA: Insufficient documentation

## 2020-05-15 DIAGNOSIS — R5383 Other fatigue: Secondary | ICD-10-CM | POA: Insufficient documentation

## 2020-05-15 DIAGNOSIS — D508 Other iron deficiency anemias: Secondary | ICD-10-CM

## 2020-05-15 DIAGNOSIS — R202 Paresthesia of skin: Secondary | ICD-10-CM | POA: Insufficient documentation

## 2020-05-15 DIAGNOSIS — R0602 Shortness of breath: Secondary | ICD-10-CM | POA: Diagnosis not present

## 2020-05-15 DIAGNOSIS — D539 Nutritional anemia, unspecified: Secondary | ICD-10-CM

## 2020-05-15 DIAGNOSIS — R200 Anesthesia of skin: Secondary | ICD-10-CM | POA: Insufficient documentation

## 2020-05-15 DIAGNOSIS — M25562 Pain in left knee: Secondary | ICD-10-CM | POA: Diagnosis not present

## 2020-05-15 DIAGNOSIS — M7989 Other specified soft tissue disorders: Secondary | ICD-10-CM | POA: Diagnosis not present

## 2020-05-15 DIAGNOSIS — R002 Palpitations: Secondary | ICD-10-CM | POA: Insufficient documentation

## 2020-05-15 LAB — CMP (CANCER CENTER ONLY)
ALT: 14 U/L (ref 0–44)
AST: 20 U/L (ref 15–41)
Albumin: 4.2 g/dL (ref 3.5–5.0)
Alkaline Phosphatase: 84 U/L (ref 38–126)
Anion gap: 8 (ref 5–15)
BUN: 15 mg/dL (ref 8–23)
CO2: 27 mmol/L (ref 22–32)
Calcium: 9 mg/dL (ref 8.9–10.3)
Chloride: 106 mmol/L (ref 98–111)
Creatinine: 0.78 mg/dL (ref 0.44–1.00)
GFR, Est AFR Am: >60 mL/min (ref >60)
GFR, Estimated: >60 mL/min (ref >60)
Glucose, Bld: 94 mg/dL (ref 70–99)
Potassium: 3.9 mmol/L (ref 3.5–5.1)
Sodium: 141 mmol/L (ref 135–145)
Total Bilirubin: 0.6 mg/dL (ref 0.3–1.2)
Total Protein: 5.9 g/dL — ABNORMAL LOW (ref 6.5–8.1)

## 2020-05-15 LAB — CBC WITH DIFFERENTIAL (CANCER CENTER ONLY)
Abs Immature Granulocytes: 0.05 10*3/uL (ref 0.00–0.07)
Basophils Absolute: 0 10*3/uL (ref 0.0–0.1)
Basophils Relative: 0 %
Eosinophils Absolute: 0.1 10*3/uL (ref 0.0–0.5)
Eosinophils Relative: 0 %
HCT: 28.4 % — ABNORMAL LOW (ref 36.0–46.0)
Hemoglobin: 9.2 g/dL — ABNORMAL LOW (ref 12.0–15.0)
Immature Granulocytes: 0 %
Lymphocytes Relative: 98 %
Lymphs Abs: 99 10*3/uL — ABNORMAL HIGH (ref 0.7–4.0)
MCH: 33.2 pg (ref 26.0–34.0)
MCHC: 32.4 g/dL (ref 30.0–36.0)
MCV: 102.5 fL — ABNORMAL HIGH (ref 80.0–100.0)
Monocytes Absolute: 1 10*3/uL (ref 0.1–1.0)
Monocytes Relative: 1 %
Neutro Abs: 1.4 10*3/uL — ABNORMAL LOW (ref 1.7–7.7)
Neutrophils Relative %: 1 %
Platelet Count: 176 10*3/uL (ref 150–400)
RBC: 2.77 MIL/uL — ABNORMAL LOW (ref 3.87–5.11)
RDW: 15 % (ref 11.5–15.5)
WBC Count: 101.6 10*3/uL (ref 4.0–10.5)
nRBC: 0 % (ref 0.0–0.2)

## 2020-05-15 LAB — IRON AND TIBC
Iron: 89 ug/dL (ref 41–142)
Saturation Ratios: 26 % (ref 21–57)
TIBC: 344 ug/dL (ref 236–444)
UIBC: 255 ug/dL (ref 120–384)

## 2020-05-15 LAB — RETICULOCYTES
Immature Retic Fract: 29.7 % — ABNORMAL HIGH (ref 2.3–15.9)
RBC.: 2.78 MIL/uL — ABNORMAL LOW (ref 3.87–5.11)
Retic Count, Absolute: 70.1 10*3/uL (ref 19.0–186.0)
Retic Ct Pct: 2.5 % (ref 0.4–3.1)

## 2020-05-15 LAB — SAVE SMEAR(SSMR), FOR PROVIDER SLIDE REVIEW

## 2020-05-15 LAB — DIRECT ANTIGLOBULIN TEST (NOT AT ARMC)
DAT, IgG: POSITIVE
DAT, complement: NEGATIVE

## 2020-05-15 LAB — FERRITIN: Ferritin: 25 ng/mL (ref 11–307)

## 2020-05-15 LAB — TSH: TSH: 2.155 u[IU]/mL (ref 0.308–3.960)

## 2020-05-15 LAB — LACTATE DEHYDROGENASE: LDH: 248 U/L — ABNORMAL HIGH (ref 98–192)

## 2020-05-15 NOTE — Telephone Encounter (Signed)
Appointments scheduled calendar printed/ Patient to pick up contrast from either front desk or imaging department per 6/7 los

## 2020-05-15 NOTE — Telephone Encounter (Signed)
Sara Sanford from lab brought a panic WBC of 101.6. Results given to MD.

## 2020-05-15 NOTE — Progress Notes (Addendum)
Hematology and Oncology Follow Up Visit  Sara Sanford [id number] 1956-04-23 64 y.o. 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 She is symptomatic with fatigue, chills, SOB and palpitations with exertion and loss of appetite.  She is doing her best to stay well hydrated. Her weight is stable at 234 lbs.  WBC count at this time is 101, lymphocytes 98%.  No fever, n/v, cough, rash, dizziness, chest pain, abdominal pain, bloating, changes in bowel or bladder habits.  She has issues with her left knee and has swelling and tenderness that wax and wane.  She has issues with balance due to the left knee and feels that this sometimes causes pain in the right side.  She denies hot flashes or night sweats.  She does note numbness and tingling in her hands ans feet that comes and goes.  No falls or syncopal episodes to report.  No episodes of bleeding. No bruising or petechiae.   ECOG Performance Status: 1 - Symptomatic but completely ambulatory  Medications:  Allergies as of 05/15/2020      Reactions   Propoxyphene Anaphylaxis, Shortness Of Breath, Other (See Comments)   Hallucinations   Nitrofurantoin Diarrhea, Rash   Welts with blisters      Medication List       Accurate as of May 15, 2020 10:03 AM. If you have any questions, ask your nurse or doctor.        clobetasol cream 0.05 % Commonly known as: TEMOVATE APPLY  CREAM TWICE TO THREE TIMES A WEEK AT  NIGHT  AS  DIRECTED   fluconazole 150 MG tablet Commonly known as: Diflucan Take 1 tablet (150 mg total) by mouth daily.   FLUoxetine 20 MG capsule Commonly known as: PROZAC Take 1  capsule by mouth once daily   omeprazole 20 MG capsule Commonly known as: PriLOSEC Take one cap each morning 30 minutes before food.  Take one cap (before eating) each evening for one week.   sulfamethoxazole-trimethoprim 400-80 MG tablet Commonly known as: BACTRIM Take 1 tablet by mouth daily.   vitamin B-12 1000 MCG tablet Commonly known as: CYANOCOBALAMIN Take 1,000 mcg by mouth daily.       Allergies:  Allergies  Allergen Reactions  . Propoxyphene Anaphylaxis, Shortness Of Breath and Other (See Comments)    Hallucinations   . Nitrofurantoin Diarrhea and Rash    Welts with blisters    Past Medical History, Surgical history, Social history, and Family History were reviewed and updated.  Review of Systems: All other 10 point review of systems is negative.   Physical Exam:  vitals were not taken for this visit.   Wt Readings from Last 3 Encounters:  03/06/20 236 lb 12.8 oz (107.4 kg)  02/14/20 237 lb (107.5 kg)  01/28/20 242 lb (109.8 kg)    Ocular: Sclerae unicteric, pupils equal, round and reactive to light Ear-nose-throat: Oropharynx clear, dentition fair Lymphatic: No cervical or supraclavicular adenopathy Lungs no rales or rhonchi, good excursion bilaterally Heart regular rate and rhythm, no murmur appreciated Abd soft, nontender, positive bowel sounds, no liver or spleen tip palpated on exam, no fluid  wave MSK no focal spinal tenderness, no joint edema Neuro: non-focal, well-oriented, appropriate affect Breasts: Deferred    Lab Results  Component Value Date   WBC 101.6 (HH) 05/15/2020   HGB 9.2 (L) 05/15/2020   HCT 28.4 (L) 05/15/2020   MCV 102.5 (H) 05/15/2020   PLT 176 05/15/2020   Lab Results  Component Value Date   FERRITIN 18 08/19/2019   IRON 86 08/19/2019   TIBC 361 08/19/2019   UIBC 275 08/19/2019   IRONPCTSAT 24 08/19/2019   Lab Results  Component Value Date   RETICCTPCT 2.5 05/15/2020   RBC 2.78 (L) 05/15/2020   No results found  for: KPAFRELGTCHN, LAMBDASER, KAPLAMBRATIO No results found for: IGGSERUM, IGA, IGMSERUM No results found for: [name], SPEI   Chemistry      Component Value Date/Time   NA 141 05/15/2020 0918   K 3.9 05/15/2020 0918   CL 106 05/15/2020 0918   CO2 27 05/15/2020 0918   BUN 15 05/15/2020 0918   CREATININE 0.78 05/15/2020 0918   CREATININE 0.69 02/01/2020 1149      Component Value Date/Time   CALCIUM 9.0 05/15/2020 0918   ALKPHOS 84 05/15/2020 0918   AST 20 05/15/2020 0918   ALT 14 05/15/2020 0918   BILITOT 0.6 05/15/2020 0918       Impression and Plan: Sara Sanford is a very pleasant 64 yo caucasian female with CLL. Her WBC and lymphocyte counts have continued to increase over the last several months and it looks like we are now going to have to start her on treatment.  Dr. Marin Olp was also present to discuss treatment options and side effects.  It was decided that she will start 3 months of Calquence and then add Venetoclax for one full year.  We will also repeat CT scans of the chest, abdomen and pelvis this week.  We will plan to see her back in 5 weeks after she has been on her new medication regimen for about a month.  She will continue taking the B 12 and folic acid as well.  She verbalized understanding and agreement with the plan and will contact our office with any questions or concerns. We can certainly see her sooner if needed.   Laverna Peace, NP 6/7/202110:03 AM   ADDENDUM: I saw Sara Sanford with [name].  I think it is clear that she needs to start therapy.  She is more anemic.  She does have a positive Coombs test.  However, her reticulocyte count is only 2.5%.  I think that we are going to have to get her on oral therapy.  I think the Calquence would be reasonable to start.  I do not see that she really has any adverse cytogenetics.  I think Calquence and then adding venetoclax would be very good approach  for Sara Sanford.  We talked to her about Calquence.  I went over the side effects of Calquence.  I think she will do well with this.  She is on vitamin B-12.  She is on folic acid.  I do not think we need to give her prednisone right now.  I do not think we need to put her on Rituxan.  Again she has a positive Coombs test but I just do not think that she is hemolyzing.  She has a normal bilirubin.  Her LDH is not that elevated..  She does not have iron deficiency.  Iron saturation is 26%.  We will  try to get the Calquence going within a week or so.  We probably need to get her back to have her labs checked in 2 or 3 weeks.  I told her that with Calquence, the white cell count will go up initially and then will start coming back down.  She will need to have the CAT scans done so that we can have a baseline for lymphadenopathy.  We spent about 45 minutes with her today.  This was low more complicated given that we may have to start her on therapy for the CLL.  Lattie Haw, MD

## 2020-05-16 LAB — HAPTOGLOBIN: Haptoglobin: <10 mg/dL — ABNORMAL LOW (ref 37–355)

## 2020-05-17 ENCOUNTER — Other Ambulatory Visit: Payer: Self-pay | Admitting: Hematology & Oncology

## 2020-05-17 MED ORDER — CALQUENCE 100 MG PO CAPS: 100.0000 mg | ORAL_CAPSULE | Freq: Two times a day (BID) | ORAL | 6 refills | Status: DC

## 2020-05-17 NOTE — Addendum Note (Signed)
Addended by: Burney Gauze R on: 05/17/2020 04:51 PM   Modules accepted: Orders, Level of Service

## 2020-05-18 ENCOUNTER — Ambulatory Visit (HOSPITAL_BASED_OUTPATIENT_CLINIC_OR_DEPARTMENT_OTHER)
Admission: RE | Admit: 2020-05-18 | Discharge: 2020-05-18 | Disposition: A | Discharge status: Home / Self Care | Payer: Medicare Other | Source: Ambulatory Visit | Attending: Family | Admitting: Family

## 2020-05-18 ENCOUNTER — Telehealth: Payer: Self-pay | Admitting: Pharmacist

## 2020-05-18 ENCOUNTER — Telehealth: Payer: Self-pay | Admitting: Pharmacy Technician

## 2020-05-18 ENCOUNTER — Other Ambulatory Visit: Payer: Self-pay

## 2020-05-18 ENCOUNTER — Encounter (HOSPITAL_BASED_OUTPATIENT_CLINIC_OR_DEPARTMENT_OTHER): Payer: Self-pay

## 2020-05-18 DIAGNOSIS — C9110 Chronic lymphocytic leukemia of B-cell type not having achieved remission: Secondary | ICD-10-CM

## 2020-05-18 MED ORDER — IOHEXOL 300 MG/ML  SOLN
100.0000 mL | Freq: Once | INTRAMUSCULAR | Status: AC | PRN
  Administered 2020-05-18: 100 mL via INTRAVENOUS

## 2020-05-18 NOTE — Telephone Encounter (Signed)
Oral Oncology Patient Advocate Encounter  Was successful in securing patient a $8,000 grant from [employer] to provide copayment coverage for Calquence.  This will keep the out of pocket expense at $0.     [employer] ID: [id number]  I have spoken with the patient.   The billing information is as follows and has been shared with [place].    RxBin: [id number] PCN: [id number] Member ID: [id number] Group ID: [id number] Dates of Eligibility: 04/18/20 through 04/17/21  Fund:  Chronic Lymphocytic [place] Patient [place] Phone [phone] Fax [phone] 05/18/2020 3:31 PM

## 2020-05-18 NOTE — Telephone Encounter (Signed)
Oral Oncology Patient Advocate Encounter   Was successful in securing patient an $76,700 grant from [facility]) to provide copayment coverage for Calquence.  This will keep the out of pocket expense at $0.     I have spoken with the patient.    The billing information is as follows and has been shared with [place].   Member ID: [id number] Group ID: [id number] RxBin: [id number] Dates of Eligibility: 02/18/20 through 05/17/21  Fund:   Chronic Lymphocytic [place] Patient [place] Phone [phone] Fax [phone] 05/18/2020 3:29 PM

## 2020-05-18 NOTE — Telephone Encounter (Signed)
Oral Oncology Patient Advocate Encounter  Received notification from [facility] that prior authorization for Calquence is required.  PA submitted on CoverMyMeds Key [id number]  Status is pending  Oral Oncology Clinic will continue to follow.  [place] Patient [place] Phone [phone] Fax [phone] 05/18/2020 10:48 AM

## 2020-05-18 NOTE — Telephone Encounter (Signed)
Oral Oncology Pharmacist Encounter  Received new prescription for Calquence (acalabrutinib) for the treatment of CLL in conjunction with venetoclax after patient is first started on acalabrutinib, planned duration until disease progression or unacceptable drug toxicity.  Prescription dose and frequency assessed.   Current medication list in Epic reviewed, one relevant DDIs with acalabrutinib identified: - Omeprazole, category X interaction: Proton Pump Inhibitors (PPI) may diminish the concentration of acalabrutinib. Recommend stopping the omeprazole.   Alternatives: H2 receptor antagonist (famotidine)-separate administration of these agents by giving acalabrutinib 2 hours before. OR Calcium carbonate- separate administration of acalabrutinib from the administration of any antacids by at least 2 hours  Prescription has been e-scribed to the [facility] for benefits analysis and approval.  Oral Oncology Clinic will continue to follow for insurance authorization, copayment issues, initial counseling and start date.  Darl Pikes, PharmD, BCPS, BCOP, CPP Hematology/Oncology Clinical Pharmacist Practitioner [facility] [phone]  05/18/2020 11:53 AM

## 2020-05-18 NOTE — Telephone Encounter (Signed)
Oral Oncology Patient Advocate Encounter  Prior Authorization for Calquence has been approved.    PA# [id number] Effective dates: 05/18/2020 - until further notice  Patients co-pay is $2916.89  Oral Oncology Clinic will continue to follow.   [place] Patient [place] Phone [phone] Fax [phone] 05/18/2020 11:46 AM

## 2020-05-19 NOTE — Telephone Encounter (Signed)
Oral Chemotherapy Pharmacist Encounter  Ms. Dueitt knows to get started when she receives her medication on 6/14.  Patient Education I spoke with patient for overview of new oral chemotherapy medication: Calquence (acalabrutinib) for the treatment of CLL in conjunction with venetoclax after patient is first started on acalabrutinib, planned duration until disease progression or unacceptable drug toxicity   Pt is doing well. Counseled patient on administration, dosing, side effects, monitoring, drug-food interactions, safe handling, storage, and disposal. Patient will take 1 capsule (100 mg total) by mouth 2 (two) times daily.  Side effects include but not limited to: HA, decreased wbc, muscle pain.    Discussed PPI DDI with patient. She is willing to wean off the PPI and use famotidine for her reflux. Discussed medication spacing with patient (see previous note for DDI detail).  Reviewed with patient importance of keeping a medication schedule and plan for any missed doses.  Ms. Bonnet voiced understanding and appreciation. All questions answered. Medication handout placed in the mail.  Provided patient with Oral [place] [facility] [place] [facility] phone number. Patient knows to call the office with questions or concerns. Oral Chemotherapy Navigation Clinic will continue to follow.  Darl Pikes, PharmD, BCPS, BCOP, CPP Hematology/Oncology Clinical Pharmacist Practitioner [facility] [place] [phone]  05/19/2020 3:25 PM

## 2020-05-22 ENCOUNTER — Other Ambulatory Visit: Payer: Self-pay | Admitting: Family

## 2020-05-22 ENCOUNTER — Other Ambulatory Visit: Payer: Self-pay

## 2020-05-22 ENCOUNTER — Ambulatory Visit (INDEPENDENT_AMBULATORY_CARE_PROVIDER_SITE_OTHER): Payer: Medicare Other

## 2020-05-22 ENCOUNTER — Telehealth: Payer: Self-pay | Admitting: *Deleted

## 2020-05-22 DIAGNOSIS — R059 Cough, unspecified: Secondary | ICD-10-CM

## 2020-05-22 DIAGNOSIS — R509 Fever, unspecified: Secondary | ICD-10-CM

## 2020-05-22 DIAGNOSIS — R05 Cough: Secondary | ICD-10-CM | POA: Diagnosis not present

## 2020-05-22 DIAGNOSIS — C9110 Chronic lymphocytic leukemia of B-cell type not having achieved remission: Secondary | ICD-10-CM

## 2020-05-22 NOTE — Telephone Encounter (Signed)
Patient called stating that she has had a fever of 100-102 in past few days, productive cough with yellow sputum and very fatigued.  Due to [facility] today.  Inquiring what to do.  Dr Marin Olp notified and wants to do CXR and hold Calquence until we see the results.  Patient aware and in agreement. Orders placed

## 2020-05-22 NOTE — Telephone Encounter (Signed)
Oral Oncology Patient Advocate Encounter  I spoke with patient on Friday 6/11 to set up delivery of Calquence.  Address verified for shipment.  Medication will be filled through [facility] and mailed 05/19/20 for delivery 6/14.    [place] will call 7-10 days before next refill is due to complete adherence call and set up delivery of medication.     [place] Patient [place] Phone [phone] Fax [phone] 05/22/2020 8:08 AM

## 2020-05-23 ENCOUNTER — Telehealth: Payer: Self-pay | Admitting: *Deleted

## 2020-05-23 NOTE — Telephone Encounter (Signed)
-----   Message from Eliezer Bottom, NP sent at 05/22/2020 12:54 PM EDT ----- CT scan showed some mild lymphadenopathy and splenomegaly which was to be expected. We will repeat in several month once she has been on therapy for a while to assess her response :) Thank you!  ----- Message ----- From: Interface, Rad Results In Sent: 05/18/2020   2:56 PM EDT To: Eliezer Bottom, NP

## 2020-05-23 NOTE — Telephone Encounter (Signed)
Call received from patient requesting CXR results and wanting to know when to start Pradaxa.  Pt notified per order of Dr. Marin Olp that the CX is WNL, the "CT scan showed some mild lymphadenopathy and splenomegaly which was to be expected.  We will repeat in several months once she has been on therapy for a while to assess her response,"  Patient also instructed per order of Dr. Marin Olp to start [place] today.  Pt appreciative of assistance and verbalizes an understanding of above.  Teach back done.

## 2020-05-26 ENCOUNTER — Telehealth (INDEPENDENT_AMBULATORY_CARE_PROVIDER_SITE_OTHER): Payer: Medicare Other | Admitting: Osteopathic Medicine

## 2020-05-26 ENCOUNTER — Encounter: Payer: Self-pay | Admitting: Osteopathic Medicine

## 2020-05-26 VITALS — Temp 98.7°F | Wt 230.0 lb

## 2020-05-26 DIAGNOSIS — J019 Acute sinusitis, unspecified: Secondary | ICD-10-CM

## 2020-05-26 DIAGNOSIS — J40 Bronchitis, not specified as acute or chronic: Secondary | ICD-10-CM

## 2020-05-26 MED ORDER — GUAIFENESIN-CODEINE 100-10 MG/5ML PO SYRP: 5.0000 mL | ORAL_SOLUTION | Freq: Four times a day (QID) | ORAL | 0 refills | Status: DC | PRN

## 2020-05-26 MED ORDER — AZITHROMYCIN 250 MG PO TABS
ORAL_TABLET | ORAL | 0 refills | Status: DC
Start: 2020-05-26 — End: 2020-06-01

## 2020-05-26 NOTE — Progress Notes (Signed)
Virtual Visit via Video (App used: phone) Note  I connected with      Sara Sanford on 05/26/20 at 10:18 AM  by a telemedicine application and verified that I am speaking with the correct person using two identifiers.  Patient is at home I am in office   I discussed the limitations of evaluation and management by telemedicine and the availability of in person appointments. The patient expressed understanding and agreed to proceed.  History of Present Illness: Sara Sanford is a 64 y.o. female who would like to discuss  Chief Complaint  Patient presents with  . Sinusitis    x 6 days. Has not taken any OTC meds due to Calquence rx.    Cough last week but not too bothersome Cough was getting worse Temp 101 and was taking Tylenol Started Calquence Rx so no other OTC meds Cough getting worse, mucus production CXR done Monday at [place] no infiltrate  Has had COVID vax    Immunization History  Administered Date(s) Administered  . Influenza Whole 09/21/2009  . Influenza, High Dose Seasonal PF 09/09/2019  . Influenza,inj,Quad PF,6+ Mos 09/23/2018  . Influenza-Unspecified 09/21/2015, 09/17/2016, 09/13/2017, 09/23/2018  . Moderna SARS-COVID-2 Vaccination 01/21/2020, 02/19/2020  . Pneumococcal Conjugate-13 09/09/2019  . Tdap 08/03/2012  . Zoster 08/21/2015      Observations/Objective: Temp 98.7 F (37.1 C)   Wt 230 lb (104.3 kg)   BMI 40.74 kg/m  BP Readings from Last 3 Encounters:  05/15/20 (!) 114/46  03/06/20 (!) 113/50  02/14/20 112/71   Exam: Normal Speech.  NAD  Lab and Radiology Results No results found for this or any previous visit (from the past 72 hour(s)). DG Chest 2 View  Result Date: 05/22/2020 CLINICAL DATA:  Cough, fever EXAM: CHEST - 2 VIEW COMPARISON:  08/09/2019 FINDINGS: Scarring in the lingula. Right lung clear. Heart is normal size. No effusions or acute bony abnormality. IMPRESSION: No active cardiopulmonary disease. Electronically  Signed   By: Rolm Baptise M.D.   On: 05/22/2020 20:25       Assessment and Plan: 64 y.o. female with The primary encounter diagnosis was Bronchitis. A diagnosis of Acute non-recurrent sinusitis, unspecified location was also pertinent to this visit.  If worse over the weekend to ER or UC for evaluation  Advised COVID test just to be safe   Meds ordered this encounter  Medications  . azithromycin (ZITHROMAX) 250 MG tablet    Sig: 2 tabs po x1 on Day 1, then 1 tab po daily on Days 2 - 5    Dispense:  6 tablet    Refill:  0  . guaiFENesin-codeine (ROBITUSSIN AC) 100-10 MG/5ML syrup    Sig: Take 5-10 mLs by mouth 4 (four) times daily as needed for cough.    Dispense:  180 mL    Refill:  0      Follow Up Instructions: Return if symptoms worsen or fail to improve.    I discussed the assessment and treatment plan with the patient. The patient was provided an opportunity to ask questions and all were answered. The patient agreed with the plan and demonstrated an understanding of the instructions.   The patient was advised to call back or seek an in-person evaluation if any new concerns, if symptoms worsen or if the condition fails to improve as anticipated.  30 minutes of non-face-to-face time was provided during this encounter.      . . . . . . . . . . . . . Marland Kitchen  Historical information moved to improve visibility of documentation.  Past Medical History:  Diagnosis Date  . Allergy   . Anxiety   . Arthritis   . Chronic lymphocytic leukemia ([place])   . Gallstones   . GERD (gastroesophageal reflux disease)   . Heart burn    peptic ulcer  . Kidney stones   . Lichen sclerosus   . Obesity   . Post-menopausal   . UTI (urinary tract infection)    Past Surgical History:  Procedure Laterality Date  . CARDIAC CATHETERIZATION  2008  . CHOLECYSTECTOMY    . COLONOSCOPY  2010   [place], [place]  . DILATION AND CURETTAGE OF UTERUS     . ESOPHAGOGASTRODUODENOSCOPY  2005   [place], [place] had egd and colon at the same time   . GALLBLADDER SURGERY  2015  . HYSTEROSCOPY WITH D & C  2008  . [place]  . UPPER GASTROINTESTINAL ENDOSCOPY     Social History   Tobacco Use  . Smoking status: Never Smoker  . Smokeless tobacco: Never Used  Substance Use Topics  . Alcohol use: No   family history includes Breast cancer in her paternal aunt and paternal aunt; Colon polyps in her father; Heart attack in her father; Hypertension in her father and mother; Stomach cancer in her maternal grandmother.  Medications: Current Outpatient Medications  Medication Sig Dispense Refill  . acalabrutinib (CALQUENCE) 100 MG capsule Take 1 capsule (100 mg total) by mouth 2 (two) times daily. 60 capsule 6  . clobetasol cream (TEMOVATE) 0.05 % APPLY  CREAM TWICE TO THREE TIMES A WEEK AT  NIGHT  AS  DIRECTED 60 g 0  . FLUoxetine (PROZAC) 20 MG capsule Take 1 capsule by mouth once daily 90 capsule 0  . folic acid (FOLVITE) 1 MG tablet Take 2 mg by mouth daily.    Marland Kitchen omeprazole (PRILOSEC) 20 MG capsule Take one cap each morning 30 minutes before food.  Take one cap (before eating) each evening for one week. 37 capsule 1  . sulfamethoxazole-trimethoprim (BACTRIM) 400-80 MG tablet Take 1 tablet by mouth daily. 90 tablet 1  . vitamin B-12 (CYANOCOBALAMIN) 1000 MCG tablet Take 1,000 mcg by mouth daily.    Marland Kitchen azithromycin (ZITHROMAX) 250 MG tablet 2 tabs po x1 on Day 1, then 1 tab po daily on Days 2 - 5 6 tablet 0  . guaiFENesin-codeine (ROBITUSSIN AC) 100-10 MG/5ML syrup Take 5-10 mLs by mouth 4 (four) times daily as needed for cough. 180 mL 0   No current facility-administered medications for this visit.   Allergies  Allergen Reactions  . Propoxyphene Anaphylaxis, Shortness Of Breath and Other (See Comments)    Hallucinations   . Nitrofurantoin Diarrhea and Rash    Welts with blisters

## 2020-06-01 ENCOUNTER — Ambulatory Visit (INDEPENDENT_AMBULATORY_CARE_PROVIDER_SITE_OTHER): Payer: Medicare Other | Admitting: Family Medicine

## 2020-06-01 ENCOUNTER — Encounter: Payer: Self-pay | Admitting: Family Medicine

## 2020-06-01 VITALS — BP 94/69 | HR 97 | Ht 62.0 in | Wt 224.0 lb

## 2020-06-01 DIAGNOSIS — N390 Urinary tract infection, site not specified: Secondary | ICD-10-CM

## 2020-06-01 DIAGNOSIS — R7309 Other abnormal glucose: Secondary | ICD-10-CM | POA: Diagnosis not present

## 2020-06-01 DIAGNOSIS — R81 Glycosuria: Secondary | ICD-10-CM

## 2020-06-01 DIAGNOSIS — R300 Dysuria: Secondary | ICD-10-CM

## 2020-06-01 LAB — POCT URINALYSIS DIP (CLINITEK)
Glucose, UA: 100 mg/dL — AB
Ketones, POC UA: NEGATIVE mg/dL
Nitrite, UA: NEGATIVE
POC PROTEIN,UA: 30 — AB
Spec Grav, UA: 1.025 (ref 1.010–1.025)
Urobilinogen, UA: 0.2 E.U./dL
pH, UA: 5.5 (ref 5.0–8.0)

## 2020-06-01 LAB — POCT GLYCOSYLATED HEMOGLOBIN (HGB A1C): Hemoglobin A1C: 5.1 % (ref 4.0–5.6)

## 2020-06-01 LAB — WET PREP FOR TRICH, YEAST, CLUE
MICRO NUMBER:: 10630010
Specimen Quality: ADEQUATE

## 2020-06-01 MED ORDER — CEPHALEXIN 500 MG PO CAPS: 500.0000 mg | ORAL_CAPSULE | Freq: Two times a day (BID) | ORAL | 0 refills | Status: DC

## 2020-06-01 NOTE — Patient Instructions (Signed)
Urinary Tract Infection, Adult A urinary tract infection (UTI) is an infection of any part of the urinary tract. The urinary tract includes:  The kidneys.  The ureters.  The bladder.  The urethra. These organs make, store, and get rid of pee (urine) in the body. What are the causes? This is caused by germs (bacteria) in your genital area. These germs grow and cause swelling (inflammation) of your urinary tract. What increases the risk? You are more likely to develop this condition if:  You have a small, thin tube (catheter) to drain pee.  You cannot control when you pee or poop (incontinence).  You are female, and: ? You use these methods to prevent pregnancy:  A medicine that kills sperm (spermicide).  A device that blocks sperm (diaphragm). ? You have low levels of a female hormone (estrogen). ? You are pregnant.  You have genes that add to your risk.  You are sexually active.  You take antibiotic medicines.  You have trouble peeing because of: ? A prostate that is bigger than normal, if you are female. ? A blockage in the part of your body that drains pee from the bladder (urethra). ? A kidney stone. ? A nerve condition that affects your bladder (neurogenic bladder). ? Not getting enough to drink. ? Not peeing often enough.  You have other conditions, such as: ? Diabetes. ? A weak disease-fighting system (immune system). ? Sickle cell disease. ? Gout. ? Injury of the spine. What are the signs or symptoms? Symptoms of this condition include:  Needing to pee right away (urgently).  Peeing often.  Peeing small amounts often.  Pain or burning when peeing.  Blood in the pee.  Pee that smells bad or not like normal.  Trouble peeing.  Pee that is cloudy.  Fluid coming from the vagina, if you are female.  Pain in the belly or lower back. Other symptoms include:  Throwing up (vomiting).  No urge to eat.  Feeling mixed up (confused).  Being tired  and grouchy (irritable).  A fever.  Watery poop (diarrhea). How is this treated? This condition may be treated with:  Antibiotic medicine.  Other medicines.  Drinking enough water. Follow these instructions at home:  Medicines  Take over-the-counter and prescription medicines only as told by your doctor.  If you were prescribed an antibiotic medicine, take it as told by your doctor. Do not stop taking it even if you start to feel better. General instructions  Make sure you: ? Pee until your bladder is empty. ? Do not hold pee for a long time. ? Empty your bladder after sex. ? Wipe from front to back after pooping if you are a female. Use each tissue one time when you wipe.  Drink enough fluid to keep your pee pale yellow.  Keep all follow-up visits as told by your doctor. This is important. Contact a doctor if:  You do not get better after 1-2 days.  Your symptoms go away and then come back. Get help right away if:  You have very bad back pain.  You have very bad pain in your lower belly.  You have a fever.  You are sick to your stomach (nauseous).  You are throwing up. Summary  A urinary tract infection (UTI) is an infection of any part of the urinary tract.  This condition is caused by germs in your genital area.  There are many risk factors for a UTI. These include having a small, thin   tube to drain pee and not being able to control when you pee or poop.  Treatment includes antibiotic medicines for germs.  Drink enough fluid to keep your pee pale yellow. This information is not intended to replace advice given to you by your health care provider. Make sure you discuss any questions you have with your health care provider. Document Revised: 11/12/2018 Document Reviewed: 06/04/2018 Elsevier Patient Education  2020 Elsevier Inc.  

## 2020-06-01 NOTE — Progress Notes (Signed)
Acute Office Visit  Subjective:    Patient ID: Sara Sanford, female    DOB: [birth year], 64 y.o.   MRN: [id number]  Chief Complaint  Patient presents with  . Urinary Tract Infection    HPI Patient is in today for dysuria on and off since Sunday.  She is also had a little bit of low back pain with it.  Denies hematuria.  She is had some pelvic pressure as well.  She says she is been running a fever on and off for about 2 weeks but felt like that was more related to her upper respiratory symptoms.  Was actually seen for a video visit about 6 days ago for bronchitis.  He was given azithromycin which she just completed yesterday. Took some Azo 2 days ago.    She also reports vaginal itching yesterday as well.  She says she often gets yeast infection after antibiotics is not uncommon.  Past Medical History:  Diagnosis Date  . Allergy   . Anxiety   . Arthritis   . Chronic lymphocytic leukemia ([place])   . Gallstones   . GERD (gastroesophageal reflux disease)   . Heart burn    peptic ulcer  . Kidney stones   . Lichen sclerosus   . Obesity   . Post-menopausal   . UTI (urinary tract infection)     Past Surgical History:  Procedure Laterality Date  . CARDIAC CATHETERIZATION  2008  . CHOLECYSTECTOMY    . COLONOSCOPY  2010   [facility], [place]  . DILATION AND CURETTAGE OF UTERUS    . ESOPHAGOGASTRODUODENOSCOPY  2005   [place], [place] had egd and colon at the same time   . GALLBLADDER SURGERY  2015  . HYSTEROSCOPY WITH D & C  2008  . [place]  . UPPER GASTROINTESTINAL ENDOSCOPY      Family History  Problem Relation Age of Onset  . Breast cancer Paternal Aunt        pm breast CA  . Breast cancer Paternal Aunt        PM breast CA  . Heart attack Father   . Hypertension Father   . Colon polyps Father   . Hypertension Mother   . Stomach cancer Maternal Grandmother   . Colon cancer Neg Hx   . Rectal cancer Neg Hx     Social History   Socioeconomic  History  . Marital status: Married    Spouse name: Not on file  . Number of children: 2  . Years of education: Not on file  . Highest education level: Not on file  Occupational History  . Occupation: Cytogeneticist: [employer]  Tobacco Use  . Smoking status: Never Smoker  . Smokeless tobacco: Never Used  Vaping Use  . Vaping Use: Never used  Substance and Sexual Activity  . Alcohol use: No  . Drug use: Never  . Sexual activity: Yes    Partners: Male  Other Topics Concern  . Not on file  Social History Narrative   2 mile walk 4 times a week.     Social Determinants of Health   Financial Resource Strain:   . Difficulty of Paying Living Expenses:   Food Insecurity:   . Worried About Charity fundraiser in the Last Year:   . Arboriculturist in the Last Year:   Transportation Needs:   . Film/video editor (Medical):   Marland Kitchen Lack of Transportation (Non-Medical):  Physical Activity:   . Days of Exercise per Week:   . Minutes of Exercise per Session:   Stress:   . Feeling of Stress :   Social Connections:   . Frequency of Communication with Friends and Family:   . Frequency of Social Gatherings with Friends and Family:   . Attends Religious Services:   . Active Member of Clubs or Organizations:   . Attends Archivist Meetings:   Marland Kitchen Marital Status:   Intimate Partner Violence:   . Fear of Current or Ex-Partner:   . Emotionally Abused:   Marland Kitchen Physically Abused:   . Sexually Abused:     Outpatient Medications Prior to Visit  Medication Sig Dispense Refill  . acalabrutinib (CALQUENCE) 100 MG capsule Take 1 capsule (100 mg total) by mouth 2 (two) times daily. 60 capsule 6  . clobetasol cream (TEMOVATE) 0.05 % APPLY  CREAM TWICE TO THREE TIMES A WEEK AT  NIGHT  AS  DIRECTED 60 g 0  . FLUoxetine (PROZAC) 20 MG capsule Take 1 capsule by mouth once daily 90 capsule 0  . folic acid (FOLVITE) 1 MG tablet Take 2 mg by mouth daily.    Marland Kitchen omeprazole (PRILOSEC) 20 MG  capsule Take one cap each morning 30 minutes before food.  Take one cap (before eating) each evening for one week. 37 capsule 1  . sulfamethoxazole-trimethoprim (BACTRIM) 400-80 MG tablet Take 1 tablet by mouth daily. 90 tablet 1  . vitamin B-12 (CYANOCOBALAMIN) 1000 MCG tablet Take 1,000 mcg by mouth daily.    Marland Kitchen azithromycin (ZITHROMAX) 250 MG tablet 2 tabs po x1 on Day 1, then 1 tab po daily on Days 2 - 5 6 tablet 0  . guaiFENesin-codeine (ROBITUSSIN AC) 100-10 MG/5ML syrup Take 5-10 mLs by mouth 4 (four) times daily as needed for cough. 180 mL 0   No facility-administered medications prior to visit.    Allergies  Allergen Reactions  . Propoxyphene Anaphylaxis, Shortness Of Breath and Other (See Comments)    Hallucinations   . Nitrofurantoin Diarrhea and Rash    Welts with blisters    Review of Systems     Objective:    Physical Exam Constitutional:      Appearance: She is well-developed.  HENT:     Head: Normocephalic and atraumatic.     Right Ear: Tympanic membrane and external ear normal.     Left Ear: Tympanic membrane and external ear normal.     Nose: Nose normal.  Eyes:     Conjunctiva/sclera: Conjunctivae normal.     Pupils: Pupils are equal, round, and reactive to light.  Neck:     Thyroid: No thyromegaly.  Cardiovascular:     Rate and Rhythm: Normal rate and regular rhythm.     Heart sounds: Normal heart sounds.  Pulmonary:     Effort: Pulmonary effort is normal.     Breath sounds: Normal breath sounds. No wheezing.  Musculoskeletal:     Cervical back: Neck supple.     Comments: No CVA tenderness  Lymphadenopathy:     Cervical: No cervical adenopathy.  Skin:    General: Skin is warm and dry.  Neurological:     Mental Status: She is alert and oriented to person, place, and time.     BP 94/69   Pulse 97   Ht 5\' 2"  (1.575 m)   Wt 224 lb (101.6 kg)   SpO2 100%   BMI 40.97 kg/m  Wt Readings from Last 3  Encounters:  06/01/20 224 lb (101.6 kg)   05/26/20 230 lb (104.3 kg)  05/15/20 234 lb (106.1 kg)    Health Maintenance Due  Topic Date Due  . FOOT EXAM  Never done  . OPHTHALMOLOGY EXAM  Never done  . URINE MICROALBUMIN  Never done  . DEXA SCAN  Never done    There are no preventive care reminders to display for this patient.   Lab Results  Component Value Date   TSH 2.155 05/15/2020   Lab Results  Component Value Date   WBC 101.6 (HH) 05/15/2020   HGB 9.2 (L) 05/15/2020   HCT 28.4 (L) 05/15/2020   MCV 102.5 (H) 05/15/2020   PLT 176 05/15/2020   Lab Results  Component Value Date   NA 141 05/15/2020   K 3.9 05/15/2020   CO2 27 05/15/2020   GLUCOSE 94 05/15/2020   BUN 15 05/15/2020   CREATININE 0.78 05/15/2020   BILITOT 0.6 05/15/2020   ALKPHOS 84 05/15/2020   AST 20 05/15/2020   ALT 14 05/15/2020   PROT 5.9 (L) 05/15/2020   ALBUMIN 4.2 05/15/2020   CALCIUM 9.0 05/15/2020   ANIONGAP 8 05/15/2020   Lab Results  Component Value Date   CHOL 206 (H) 02/01/2020   Lab Results  Component Value Date   HDL 38 (L) 02/01/2020   Lab Results  Component Value Date   LDLCALC 143 (H) 02/01/2020   Lab Results  Component Value Date   TRIG 124 02/01/2020   Lab Results  Component Value Date   CHOLHDL 5.4 (H) 02/01/2020   Lab Results  Component Value Date   HGBA1C 5.1 06/01/2020       Assessment & Plan:   Problem List Items Addressed This Visit      Genitourinary   Recurrent UTI - Primary   Relevant Medications   cephALEXin (KEFLEX) 500 MG capsule   Other Relevant Orders   POCT URINALYSIS DIP (CLINITEK) (Completed)   WET PREP FOR [place], YEAST, CLUE   Urine Culture    Other Visit Diagnoses    Dysuria       Relevant Orders   WET PREP FOR [place], YEAST, CLUE   Glucose found in urine on examination       Abnormal glucose       Relevant Orders   POCT glycosylated hemoglobin (Hb A1C) (Completed)     Dysuria-most consistent with UTI.  Will send for culture though we may not have enough specimen  to run a full sample.  She also had glucose in the urine so we did go ahead and do a hemoglobin A1c just to make sure there was no sign of diabetes or prediabetes.  She did take Azo which may have affected the test.  Vaginal itching-wet prep performed will call with results once available.  Abnormal glucose - a1C looks great!    Meds ordered this encounter  Medications  . cephALEXin (KEFLEX) 500 MG capsule    Sig: Take 1 capsule (500 mg total) by mouth 2 (two) times daily.    Dispense:  6 capsule    Refill:  0     Beatrice Lecher, MD

## 2020-06-01 NOTE — Progress Notes (Signed)
69 

## 2020-06-03 LAB — URINE CULTURE
MICRO NUMBER:: 10631504
SPECIMEN QUALITY:: ADEQUATE

## 2020-06-05 NOTE — Progress Notes (Signed)
E.coli seen in urine culture. Keflex should treat it was not specifically check but the family is sensitive. How are you feeling today? If not feeling better could switch to antibiotic that is 100 percent sensitive.

## 2020-06-06 ENCOUNTER — Other Ambulatory Visit: Payer: Self-pay | Admitting: Physician Assistant

## 2020-06-06 MED ORDER — CIPROFLOXACIN HCL 500 MG PO TABS
500.0000 mg | ORAL_TABLET | Freq: Two times a day (BID) | ORAL | 0 refills | Status: DC
Start: 2020-06-06 — End: 2020-07-20

## 2020-06-06 NOTE — Progress Notes (Signed)
Switched to cipro twice a day for 7 days.

## 2020-06-19 ENCOUNTER — Telehealth: Payer: Self-pay | Admitting: Family

## 2020-06-19 ENCOUNTER — Inpatient Hospital Stay: Payer: Medicare Other | Attending: Family

## 2020-06-19 ENCOUNTER — Other Ambulatory Visit: Payer: Self-pay

## 2020-06-19 ENCOUNTER — Inpatient Hospital Stay (HOSPITAL_BASED_OUTPATIENT_CLINIC_OR_DEPARTMENT_OTHER): Payer: Medicare Other | Admitting: Family

## 2020-06-19 DIAGNOSIS — Z79899 Other long term (current) drug therapy: Secondary | ICD-10-CM | POA: Diagnosis not present

## 2020-06-19 DIAGNOSIS — R200 Anesthesia of skin: Secondary | ICD-10-CM | POA: Insufficient documentation

## 2020-06-19 DIAGNOSIS — C9110 Chronic lymphocytic leukemia of B-cell type not having achieved remission: Secondary | ICD-10-CM

## 2020-06-19 DIAGNOSIS — R202 Paresthesia of skin: Secondary | ICD-10-CM | POA: Insufficient documentation

## 2020-06-19 DIAGNOSIS — K219 Gastro-esophageal reflux disease without esophagitis: Secondary | ICD-10-CM | POA: Diagnosis not present

## 2020-06-19 DIAGNOSIS — Z881 Allergy status to other antibiotic agents status: Secondary | ICD-10-CM | POA: Insufficient documentation

## 2020-06-19 LAB — CBC WITH DIFFERENTIAL (CANCER CENTER ONLY)
Abs Immature Granulocytes: 0.08 10*3/uL — ABNORMAL HIGH (ref 0.00–0.07)
Basophils Absolute: 0 10*3/uL (ref 0.0–0.1)
Basophils Relative: 0 %
Eosinophils Absolute: 0.1 10*3/uL (ref 0.0–0.5)
Eosinophils Relative: 0 %
HCT: 29.9 % — ABNORMAL LOW (ref 36.0–46.0)
Hemoglobin: 9.2 g/dL — ABNORMAL LOW (ref 12.0–15.0)
Immature Granulocytes: 0 %
Lymphocytes Relative: 98 %
Lymphs Abs: 102.8 10*3/uL — ABNORMAL HIGH (ref 0.7–4.0)
MCH: 33.2 pg (ref 26.0–34.0)
MCHC: 30.8 g/dL (ref 30.0–36.0)
MCV: 107.9 fL — ABNORMAL HIGH (ref 80.0–100.0)
Monocytes Absolute: 0.2 10*3/uL (ref 0.1–1.0)
Monocytes Relative: 0 %
Neutro Abs: 1.8 10*3/uL (ref 1.7–7.7)
Neutrophils Relative %: 2 %
Platelet Count: 136 10*3/uL — ABNORMAL LOW (ref 150–400)
RBC: 2.77 MIL/uL — ABNORMAL LOW (ref 3.87–5.11)
RDW: 16.4 % — ABNORMAL HIGH (ref 11.5–15.5)
WBC Count: 105 10*3/uL (ref 4.0–10.5)
nRBC: 0 % (ref 0.0–0.2)

## 2020-06-19 LAB — CMP (CANCER CENTER ONLY)
ALT: 14 U/L (ref 0–44)
AST: 18 U/L (ref 15–41)
Albumin: 4.2 g/dL (ref 3.5–5.0)
Alkaline Phosphatase: 62 U/L (ref 38–126)
Anion gap: 6 (ref 5–15)
BUN: 12 mg/dL (ref 8–23)
CO2: 27 mmol/L (ref 22–32)
Calcium: 8.9 mg/dL (ref 8.9–10.3)
Chloride: 108 mmol/L (ref 98–111)
Creatinine: 0.77 mg/dL (ref 0.44–1.00)
GFR, Est AFR Am: >60 mL/min (ref >60)
GFR, Estimated: >60 mL/min (ref >60)
Glucose, Bld: 102 mg/dL — ABNORMAL HIGH (ref 70–99)
Potassium: 4.5 mmol/L (ref 3.5–5.1)
Sodium: 141 mmol/L (ref 135–145)
Total Bilirubin: 0.5 mg/dL (ref 0.3–1.2)
Total Protein: 5.6 g/dL — ABNORMAL LOW (ref 6.5–8.1)

## 2020-06-19 LAB — SAVE SMEAR(SSMR), FOR PROVIDER SLIDE REVIEW

## 2020-06-19 NOTE — Telephone Encounter (Signed)
Called and LMVM for patient regarding appointments scheduled per 7/12 los

## 2020-06-19 NOTE — Progress Notes (Signed)
Hematology and Oncology Follow Up Visit  Sara Sanford [id number] 10-30-1956 64 y.o. 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 She is doing well but since starting the Calquence she had to quit taking her Prilosec. This has caused significant increase in her GERD. She has had trouble with night time reflux, choking while eating and has a dry cough. She has been eating slowly which has helped and has been able to hydrate. Her weight is stable at 226 lbs.  She has some red dots across her right chest. No itching or burning. She denies any episodes of bleeding or bruising.  Platelets are 136, Hgb 9.2 and WBC count 105.   No fever, chills, n/v, rash, dizziness, SOB, chest pain, palpitations, abdominal pain or changes in bowel or bladder habits.  No hot flashes or night sweats.  She takes a stool softener as needed for episodes of constipation.  No swelling noted at this time.  She has numbness and tingling in her hands and feet that comes and goes.  No falls or syncope.   ECOG Performance Status: 1 - Symptomatic but completely ambulatory  Medications:  Allergies as of 06/19/2020      Reactions   Propoxyphene Anaphylaxis, Shortness Of Breath, Other (See Comments)   Hallucinations   Nitrofurantoin Diarrhea, Rash   Welts with blisters      Medication List       Accurate as of June 19, 2020  2:16 PM. If you have any questions, ask your nurse or doctor.        Calquence 100 MG capsule Generic drug: acalabrutinib Take 1 capsule (100 mg total) by mouth 2 (two) times  daily.   cephALEXin 500 MG capsule Commonly known as: KEFLEX Take 1 capsule (500 mg total) by mouth 2 (two) times daily.   ciprofloxacin 500 MG tablet Commonly known as: Cipro Take 1 tablet (500 mg total) by mouth 2 (two) times daily. For 7 days.   clobetasol cream 0.05 % Commonly known as: TEMOVATE APPLY  CREAM TWICE TO THREE TIMES A WEEK AT  NIGHT  AS  DIRECTED   FLUoxetine 20 MG capsule Commonly known as: PROZAC Take 1 capsule by mouth once daily   folic acid 1 MG tablet Commonly known as: FOLVITE Take 2 mg by mouth daily.   omeprazole 20 MG capsule Commonly known as: PriLOSEC Take one cap each morning 30 minutes before food.  Take one cap (before eating) each evening for one week.   sulfamethoxazole-trimethoprim 400-80 MG tablet Commonly known as: BACTRIM Take 1 tablet by mouth daily.   vitamin B-12 1000 MCG tablet Commonly known as: CYANOCOBALAMIN Take 1,000 mcg by mouth daily.       Allergies:  Allergies  Allergen Reactions  . Propoxyphene Anaphylaxis, Shortness Of Breath and Other (  See Comments)    Hallucinations   . Nitrofurantoin Diarrhea and Rash    Welts with blisters    Past Medical History, Surgical history, Social history, and Family History were reviewed and updated.  Review of Systems: All other 10 point review of systems is negative.   Physical Exam:  vitals were not taken for this visit.   Wt Readings from Last 3 Encounters:  06/01/20 224 lb (101.6 kg)  05/26/20 230 lb (104.3 kg)  05/15/20 234 lb (106.1 kg)    Ocular: Sclerae unicteric, pupils equal, round and reactive to light Ear-nose-throat: Oropharynx clear, dentition fair Lymphatic: No cervical or supraclavicular adenopathy Lungs no rales or rhonchi, good excursion bilaterally Heart regular rate and rhythm, no murmur appreciated Abd soft, nontender, positive bowel sounds, no liver or spleen tip palpated on exam, no fluid wave  MSK no focal spinal tenderness, no joint  edema Neuro: non-focal, well-oriented, appropriate affect Breasts: Deferred   Lab Results  Component Value Date   WBC 105.0 (HH) 06/19/2020   HGB 9.2 (L) 06/19/2020   HCT 29.9 (L) 06/19/2020   MCV 107.9 (H) 06/19/2020   PLT 136 (L) 06/19/2020   Lab Results  Component Value Date   FERRITIN 25 05/15/2020   IRON 89 05/15/2020   TIBC 344 05/15/2020   UIBC 255 05/15/2020   IRONPCTSAT 26 05/15/2020   Lab Results  Component Value Date   RETICCTPCT 2.5 05/15/2020   RBC 2.77 (L) 06/19/2020   No results found for: KPAFRELGTCHN, LAMBDASER, KAPLAMBRATIO No results found for: IGGSERUM, IGA, IGMSERUM No results found for: [name], SPEI   Chemistry      Component Value Date/Time   NA 141 06/19/2020 1341   K 4.5 06/19/2020 1341   CL 108 06/19/2020 1341   CO2 27 06/19/2020 1341   BUN 12 06/19/2020 1341   CREATININE 0.77 06/19/2020 1341   CREATININE 0.69 02/01/2020 1149      Component Value Date/Time   CALCIUM 8.9 06/19/2020 1341   ALKPHOS 62 06/19/2020 1341   AST 18 06/19/2020 1341   ALT 14 06/19/2020 1341   BILITOT 0.5 06/19/2020 1341       Impression and Plan: Sara Sanford is a very pleasant 64 yo caucasian female with CLL. Her WBC and lymphocyte counts have continued to increase over the last several months and it looks like we are now going to have to start her on treatment.  She will complete 3 months of Calquence (started 05/23/2020) and then add Venetoclax for one full year.  We will plan to see her back in another 6 weeks and repeat lab work to assess her response.  I spoke with Nuala Alpha and she will advise her on treatment for GERD.   She can contact our office with any questions or concerns.   Laverna Peace, NP 7/12/20212:16 PM

## 2020-06-20 LAB — LACTATE DEHYDROGENASE: LDH: 199 U/L — ABNORMAL HIGH (ref 98–192)

## 2020-06-20 MED FILL — CALQUENCE 100 MG CAPSULE: 100 | 30 days supply | Qty: 60 | Fill #1

## 2020-06-21 ENCOUNTER — Other Ambulatory Visit: Payer: Self-pay | Admitting: Family Medicine

## 2020-06-27 ENCOUNTER — Other Ambulatory Visit: Payer: Self-pay | Admitting: Family Medicine

## 2020-06-27 DIAGNOSIS — Z1231 Encounter for screening mammogram for malignant neoplasm of breast: Secondary | ICD-10-CM

## 2020-06-29 ENCOUNTER — Ambulatory Visit (INDEPENDENT_AMBULATORY_CARE_PROVIDER_SITE_OTHER): Payer: Medicare Other

## 2020-06-29 ENCOUNTER — Other Ambulatory Visit: Payer: Self-pay

## 2020-06-29 DIAGNOSIS — Z1231 Encounter for screening mammogram for malignant neoplasm of breast: Secondary | ICD-10-CM

## 2020-07-13 MED FILL — CALQUENCE 100 MG CAPSULE: 100 | 30 days supply | Qty: 60 | Fill #2

## 2020-07-20 ENCOUNTER — Ambulatory Visit (INDEPENDENT_AMBULATORY_CARE_PROVIDER_SITE_OTHER): Payer: Medicare Other | Admitting: Obstetrics and Gynecology

## 2020-07-20 ENCOUNTER — Other Ambulatory Visit (HOSPITAL_COMMUNITY)
Admission: RE | Admit: 2020-07-20 | Discharge: 2020-07-20 | Disposition: A | Discharge status: Home / Self Care | Payer: Medicare Other | Source: Ambulatory Visit | Attending: Obstetrics and Gynecology | Admitting: Obstetrics and Gynecology

## 2020-07-20 ENCOUNTER — Other Ambulatory Visit: Payer: Self-pay

## 2020-07-20 ENCOUNTER — Encounter: Payer: Self-pay | Admitting: Obstetrics and Gynecology

## 2020-07-20 VITALS — BP 100/53 | HR 84 | Resp 16 | Ht 64.0 in | Wt 227.0 lb

## 2020-07-20 DIAGNOSIS — Z9189 Other specified personal risk factors, not elsewhere classified: Secondary | ICD-10-CM | POA: Diagnosis not present

## 2020-07-20 DIAGNOSIS — Z0000 Encounter for general adult medical examination without abnormal findings: Secondary | ICD-10-CM | POA: Diagnosis not present

## 2020-07-20 DIAGNOSIS — Z01419 Encounter for gynecological examination (general) (routine) without abnormal findings: Secondary | ICD-10-CM

## 2020-07-20 DIAGNOSIS — L900 Lichen sclerosus et atrophicus: Secondary | ICD-10-CM

## 2020-07-20 DIAGNOSIS — Z1151 Encounter for screening for human papillomavirus (HPV): Secondary | ICD-10-CM | POA: Insufficient documentation

## 2020-07-20 DIAGNOSIS — Z124 Encounter for screening for malignant neoplasm of cervix: Secondary | ICD-10-CM | POA: Diagnosis not present

## 2020-07-20 DIAGNOSIS — Z780 Asymptomatic menopausal state: Secondary | ICD-10-CM

## 2020-07-20 MED ORDER — CLOBETASOL PROPIONATE 0.05 % EX CREA: TOPICAL_CREAM | CUTANEOUS | 0 refills | Status: DC

## 2020-07-20 NOTE — Progress Notes (Signed)
GYNECOLOGY ANNUAL PREVENTATIVE CARE ENCOUNTER NOTE  Subjective:   Sara Sanford is a 64 y.o. G33P0010 female here for a annual gynecologic exam. Current complaints: none. Here for annual exam. Occasionally has a drop of blood with intercourse, no other bleeding at any point. Has pain in right flank, feels sharp/achy. Occurs randomly, nothing worsens or improves it. Thinks it is on the right iliac bone.   Also with lichen sclerosis. Has bene using clobetasol cream to good effect and is hapyp with it. Requesting refill.   Denies abnormal vaginal bleeding, discharge, pelvic pain, problems with intercourse or other gynecologic concerns. Declines STI screen.   Gynecologic History No LMP recorded. Patient is postmenopausal. Contraception: post menopausal status Last Pap: 07/2015. Results: normal Last mammogram: 06/2020. Results: Bir[facility] 1 DEXA: has had  Obstetric History OB History  Gravida Para Term Preterm AB Living  4 3     1     SAB TAB Ectopic Multiple Live Births  1            # Outcome Date GA Lbr Len/2nd Weight Sex Delivery Anes PTL Lv  4 Para           3 Para           2 Para           1 SAB              Birth Comments: D and C    Past Medical History:  Diagnosis Date   Allergy    Anxiety    Arthritis    Chronic lymphocytic leukemia ([place])    Gallstones    GERD (gastroesophageal reflux disease)    Heart burn    peptic ulcer   Kidney stones    Lichen sclerosus    Obesity    Post-menopausal    UTI (urinary tract infection)     Past Surgical History:  Procedure Laterality Date   CARDIAC CATHETERIZATION  2008   CHOLECYSTECTOMY     COLONOSCOPY  2010   [place], [place]   DILATION AND CURETTAGE OF UTERUS     ESOPHAGOGASTRODUODENOSCOPY  2005   [place], [place] had egd and colon at the same time    GALLBLADDER SURGERY  2015   HYSTEROSCOPY WITH D & C  2008   [place]   UPPER GASTROINTESTINAL ENDOSCOPY      Current  Outpatient Medications on File Prior to Visit  Medication Sig Dispense Refill   acalabrutinib (CALQUENCE) 100 MG capsule Take 1 capsule (100 mg total) by mouth 2 (two) times daily. 60 capsule 6   FLUoxetine (PROZAC) 20 MG capsule Take 1 capsule by mouth once daily 90 capsule 3   folic acid (FOLVITE) 1 MG tablet Take 2 mg by mouth daily.     sulfamethoxazole-trimethoprim (BACTRIM) 400-80 MG tablet Take 1 tablet by mouth daily. 90 tablet 1   vitamin B-12 (CYANOCOBALAMIN) 1000 MCG tablet Take 1,000 mcg by mouth daily.     omeprazole (PRILOSEC) 20 MG capsule Take one cap each morning 30 minutes before food.  Take one cap (before eating) each evening for one week. (Patient not taking: Reported on 07/20/2020) 37 capsule 1   No current facility-administered medications on file prior to visit.    Allergies  Allergen Reactions   Propoxyphene Anaphylaxis, Shortness Of Breath and Other (See Comments)    Hallucinations    Nitrofurantoin Diarrhea and Rash    Welts with blisters    Social History  Socioeconomic History   Marital status: Married    Spouse name: Not on file   Number of children: 2   Years of education: Not on file   Highest education level: Not on file  Occupational History   Occupation: Waitress    Employer: [employer]  Tobacco Use   Smoking status: Never Smoker   Smokeless tobacco: Never Used  Scientific laboratory technician Use: Never used  Substance and Sexual Activity   Alcohol use: No   Drug use: Never   Sexual activity: Yes    Partners: Male    Birth control/protection: Post-menopausal  Other Topics Concern   Not on file  Social History Narrative   2 mile walk 4 times a week.     Social Determinants of Health   Financial Resource Strain:    Difficulty of Paying Living Expenses:   Food Insecurity:    Worried About Charity fundraiser in the Last Year:    Arboriculturist in the Last Year:   Transportation Needs:    Film/video editor  (Medical):    Lack of Transportation (Non-Medical):   Physical Activity:    Days of Exercise per Week:    Minutes of Exercise per Session:   Stress:    Feeling of Stress :   Social Connections:    Frequency of Communication with Friends and Family:    Frequency of Social Gatherings with Friends and Family:    Attends Religious Services:    Active Member of Clubs or Organizations:    Attends Music therapist:    Marital Status:   Intimate Partner Violence:    Fear of Current or Ex-Partner:    Emotionally Abused:    Physically Abused:    Sexually Abused:     Family History  Problem Relation Age of Onset   Breast cancer Paternal Aunt        pm breast CA   Breast cancer Paternal Aunt        PM breast CA   Heart attack Father    Hypertension Father    Colon polyps Father    Hypertension Mother    Stomach cancer Maternal Grandmother    Colon cancer Neg Hx    Rectal cancer Neg Hx    The following portions of the patient's history were reviewed and updated as appropriate: allergies, current medications, past family history, past medical history, past social history, past surgical history and problem list.  Review of Systems Pertinent items are noted in HPI.   Objective:  BP (!) 100/53    Pulse 84    Resp 16    Ht 5\' 4"  (1.626 m)    Wt 227 lb (103 kg)    BMI 38.96 kg/m  CONSTITUTIONAL: Well-developed, well-nourished female in no acute distress.  HENT:  Normocephalic, atraumatic, External right and left ear normal. Oropharynx is clear and moist EYES: Conjunctivae and EOM are normal. Pupils are equal, round, and reactive to light. No scleral icterus.  NECK: Normal range of motion, supple, no masses.  Normal thyroid.  SKIN: Skin is warm and dry. No rash noted. Not diaphoretic. No erythema. No pallor. NEUROLOGIC: Alert and oriented to person, place, and time. Normal reflexes, muscle tone coordination. No cranial nerve deficit noted. PSYCHIATRIC:  Normal mood and affect. Normal behavior. Normal judgment and thought content. CARDIOVASCULAR: Normal heart rate noted RESPIRATORY: Clear to auscultation bilaterally. Effort and breath sounds normal, no problems with respiration noted. BREASTS: Symmetric in  size. No masses, skin changes, nipple drainage, or lymphadenopathy. ABDOMEN: Soft, no distention noted.  No tenderness, rebound or guarding.  PELVIC: Normal appearing and mildly atrophic external genitalia; normal appearing vaginal mucosa and cervix.  No abnormal discharge noted.  Pap smear obtained. Normal uterine size, no other palpable masses, no uterine or adnexal tenderness. MUSCULOSKELETAL: Normal range of motion. No tenderness.  No cyanosis, clubbing, or edema.  2+ distal pulses.  Exam done with chaperone present.  FRAX score done 07/20/20: DEXA ordered today  Assessment and Plan:   1. Well woman exam Healthy exam  2. Cervical cancer screening - Cytology - PAP( [place])  3. At risk for loss of bone density  4. Post-menopause - DG Bone Density; Future  5. Lichen sclerosus Doing well - clobetasol cream (TEMOVATE) 0.05 %; APPLY  CREAM TWICE TO THREE TIMES A WEEK AT  NIGHT  AS  DIRECTED  Dispense: 60 g; Refill: 0   Will follow up results of pap smear screen and manage accordingly. Encouraged improvement in diet and exercise.  COVID vaccine UTD Declines STI screen. Mammogram UTD Referral for colonoscopy n/a, UD DEXA due based on age  Routine preventative health maintenance measures emphasized. Please refer to After Visit Summary for other counseling recommendations.   Total face-to-face time with patient: 30 minutes. Over 50% of encounter was spent on counseling and coordination of care.   Feliz Beam, M.D. Attending Center for [employer] Fish farm manager)

## 2020-07-24 ENCOUNTER — Telehealth: Payer: Self-pay | Admitting: *Deleted

## 2020-07-24 LAB — CYTOLOGY - PAP
Comment: NEGATIVE
Diagnosis: NEGATIVE
High risk HPV: NEGATIVE

## 2020-07-24 NOTE — Telephone Encounter (Signed)
-----   Message from Sloan Leiter, MD sent at 07/24/2020  2:10 PM EDT ----- Normal pap, repeat 3 years Please call and let patient know

## 2020-07-24 NOTE — Telephone Encounter (Signed)
LM on home phone voicemail of neg pap and she doesn't need another one for 3 years.

## 2020-07-26 ENCOUNTER — Other Ambulatory Visit: Payer: Self-pay

## 2020-07-26 ENCOUNTER — Ambulatory Visit (INDEPENDENT_AMBULATORY_CARE_PROVIDER_SITE_OTHER): Payer: Medicare Other

## 2020-07-26 DIAGNOSIS — Z780 Asymptomatic menopausal state: Secondary | ICD-10-CM

## 2020-08-02 ENCOUNTER — Inpatient Hospital Stay (HOSPITAL_BASED_OUTPATIENT_CLINIC_OR_DEPARTMENT_OTHER): Payer: Medicare Other | Admitting: Family

## 2020-08-02 ENCOUNTER — Other Ambulatory Visit: Payer: Self-pay

## 2020-08-02 ENCOUNTER — Telehealth: Payer: Self-pay | Admitting: *Deleted

## 2020-08-02 ENCOUNTER — Telehealth: Payer: Self-pay | Admitting: Family

## 2020-08-02 ENCOUNTER — Inpatient Hospital Stay: Payer: Medicare Other | Attending: Family

## 2020-08-02 ENCOUNTER — Inpatient Hospital Stay: Payer: Medicare Other

## 2020-08-02 ENCOUNTER — Encounter: Payer: Self-pay | Admitting: Family

## 2020-08-02 ENCOUNTER — Inpatient Hospital Stay: Payer: Medicare Other | Admitting: Hematology & Oncology

## 2020-08-02 VITALS — BP 93/51 | HR 62 | Resp 16 | Ht 64.0 in | Wt 227.0 lb

## 2020-08-02 DIAGNOSIS — C9110 Chronic lymphocytic leukemia of B-cell type not having achieved remission: Secondary | ICD-10-CM

## 2020-08-02 DIAGNOSIS — M7989 Other specified soft tissue disorders: Secondary | ICD-10-CM | POA: Insufficient documentation

## 2020-08-02 DIAGNOSIS — Z9049 Acquired absence of other specified parts of digestive tract: Secondary | ICD-10-CM | POA: Diagnosis not present

## 2020-08-02 DIAGNOSIS — Z79899 Other long term (current) drug therapy: Secondary | ICD-10-CM | POA: Insufficient documentation

## 2020-08-02 DIAGNOSIS — Z881 Allergy status to other antibiotic agents status: Secondary | ICD-10-CM | POA: Insufficient documentation

## 2020-08-02 LAB — CMP (CANCER CENTER ONLY)
ALT: 13 U/L (ref 0–44)
AST: 15 U/L (ref 15–41)
Albumin: 4.2 g/dL (ref 3.5–5.0)
Alkaline Phosphatase: 74 U/L (ref 38–126)
Anion gap: 5 (ref 5–15)
BUN: 12 mg/dL (ref 8–23)
CO2: 28 mmol/L (ref 22–32)
Calcium: 9.3 mg/dL (ref 8.9–10.3)
Chloride: 107 mmol/L (ref 98–111)
Creatinine: 0.77 mg/dL (ref 0.44–1.00)
GFR, Est AFR Am: >60 mL/min (ref >60)
GFR, Estimated: >60 mL/min (ref >60)
Glucose, Bld: 90 mg/dL (ref 70–99)
Potassium: 4.5 mmol/L (ref 3.5–5.1)
Sodium: 140 mmol/L (ref 135–145)
Total Bilirubin: 0.5 mg/dL (ref 0.3–1.2)
Total Protein: 6 g/dL — ABNORMAL LOW (ref 6.5–8.1)

## 2020-08-02 LAB — CBC WITH DIFFERENTIAL (CANCER CENTER ONLY)
Abs Immature Granulocytes: 0.04 10*3/uL (ref 0.00–0.07)
Basophils Absolute: 0 10*3/uL (ref 0.0–0.1)
Basophils Relative: 0 %
Eosinophils Absolute: 0 10*3/uL (ref 0.0–0.5)
Eosinophils Relative: 0 %
HCT: 33.2 % — ABNORMAL LOW (ref 36.0–46.0)
Hemoglobin: 10.7 g/dL — ABNORMAL LOW (ref 12.0–15.0)
Immature Granulocytes: 0 %
Lymphocytes Relative: 96 %
Lymphs Abs: 62.4 10*3/uL — ABNORMAL HIGH (ref 0.7–4.0)
MCH: 33.4 pg (ref 26.0–34.0)
MCHC: 32.2 g/dL (ref 30.0–36.0)
MCV: 103.8 fL — ABNORMAL HIGH (ref 80.0–100.0)
Monocytes Absolute: 0.3 10*3/uL (ref 0.1–1.0)
Monocytes Relative: 1 %
Neutro Abs: 2.2 10*3/uL (ref 1.7–7.7)
Neutrophils Relative %: 3 %
Platelet Count: 150 10*3/uL (ref 150–400)
RBC: 3.2 MIL/uL — ABNORMAL LOW (ref 3.87–5.11)
RDW: 12.4 % (ref 11.5–15.5)
WBC Count: 65 10*3/uL (ref 4.0–10.5)
nRBC: 0 % (ref 0.0–0.2)

## 2020-08-02 LAB — SAVE SMEAR(SSMR), FOR PROVIDER SLIDE REVIEW

## 2020-08-02 NOTE — Telephone Encounter (Signed)
Appointments scheduled calendar printed per 8/25 los

## 2020-08-02 NOTE — Telephone Encounter (Signed)
Jory Ee NP notified of WBC-65.0.  No new orders received at this time.

## 2020-08-02 NOTE — Progress Notes (Signed)
Hematology and Oncology Follow Up Visit  HARPREET SIGNORE [id number] 09/29/1956 64 y.o. 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 Giacobbe is here today for follow-up. She is doing well on Calquence and has had a nice response. Her WBC count is down from 105 to 65. Platelet count is 150 and Hgb 10.7.  She has noticed that her energy is improved. So far, she has not had any adverse side effects.  No fever, chills, n/v, cough, rash, dizziness, SOB, chest pain, palpitations, abdominal pain or changes in bowel or bladder habits.  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 This is unchanged.  No episodes of bleeding. No bruising or petechiae.  She has swelling that comes and goes in the left leg.  No swelling, tenderness, numbness or tingling in her extremities at this time.  No falls or syncope.  She has maintained a good appetite and is staying well hydrated. Her weight is stable.   ECOG Performance Status: 1 - Symptomatic but completely ambulatory  Medications:  Allergies as of 08/02/2020      Reactions   Propoxyphene Anaphylaxis, Shortness Of Breath, Other (See Comments)   Hallucinations   Nitrofurantoin Diarrhea, Rash   Welts with blisters      Medication List       Accurate as of August 02, 2020  3:11 PM. If you have any questions, ask your nurse or doctor.        Calquence 100 MG capsule Generic drug: acalabrutinib Take 1 capsule (100 mg total) by mouth 2 (two) times daily.   clobetasol cream 0.05 % Commonly known as: TEMOVATE APPLY  CREAM TWICE TO THREE TIMES A WEEK AT  NIGHT   AS  DIRECTED   FLUoxetine 20 MG capsule Commonly known as: PROZAC Take 1 capsule by mouth once daily   folic acid 1 MG tablet Commonly known as: FOLVITE Take 2 mg by mouth daily.   omeprazole 20 MG capsule Commonly known as: PriLOSEC Take one cap each morning 30 minutes before food.  Take one cap (before eating) each evening for one week.   sulfamethoxazole-trimethoprim 400-80 MG tablet Commonly known as: BACTRIM Take 1 tablet by mouth daily.   vitamin B-12 1000 MCG tablet Commonly known as: CYANOCOBALAMIN Take 1,000 mcg by mouth daily.       Allergies:  Allergies  Allergen Reactions  . Propoxyphene Anaphylaxis, Shortness Of Breath and Other (See Comments)    Hallucinations   . Nitrofurantoin Diarrhea and Rash    Welts with blisters    Past Medical History, Surgical history, Social history, and Family History were reviewed and updated.  Review of Systems: All other 10 point review of systems is negative.   Physical Exam:  height is 5\' 4"  (1.626 m) and weight is 227 lb 0.6 oz (103 kg). Her blood pressure is 93/51 (abnormal) and her pulse is 62. Her respiration is 16 and oxygen saturation is 98%.   Wt Readings from Last 3 Encounters:  08/02/20 227 lb 0.6 oz (103 kg)  07/20/20 227 lb (103 kg)  06/01/20 224 lb (101.6 kg)    Ocular: Sclerae unicteric, pupils equal, round and reactive to light Ear-nose-throat: Oropharynx  clear, dentition fair Lymphatic: No cervical or supraclavicular adenopathy Lungs no rales or rhonchi, good excursion bilaterally Heart regular rate and rhythm, no murmur appreciated Abd soft, nontender, positive bowel sounds, no liver or spleen tip palpated on exam, no fluid wave  MSK no focal spinal tenderness, no joint edema Neuro: non-focal, well-oriented, appropriate affect Breasts: Deferred   Lab Results  Component Value Date   WBC 65.0 (HH) 08/02/2020   HGB 10.7 (L) 08/02/2020   HCT 33.2 (L) 08/02/2020   MCV 103.8 (H) 08/02/2020   PLT  150 08/02/2020   Lab Results  Component Value Date   FERRITIN 25 05/15/2020   IRON 89 05/15/2020   TIBC 344 05/15/2020   UIBC 255 05/15/2020   IRONPCTSAT 26 05/15/2020   Lab Results  Component Value Date   RETICCTPCT 2.5 05/15/2020   RBC 3.20 (L) 08/02/2020   No results found for: KPAFRELGTCHN, LAMBDASER, KAPLAMBRATIO No results found for: IGGSERUM, IGA, IGMSERUM No results found for: Ronnald Ramp, A1GS, Nelida Meuse, SPEI   Chemistry      Component Value Date/Time   NA 140 08/02/2020 1430   K 4.5 08/02/2020 1430   CL 107 08/02/2020 1430   CO2 28 08/02/2020 1430   BUN 12 08/02/2020 1430   CREATININE 0.77 08/02/2020 1430   CREATININE 0.69 02/01/2020 1149      Component Value Date/Time   CALCIUM 9.3 08/02/2020 1430   ALKPHOS 74 08/02/2020 1430   AST 15 08/02/2020 1430   ALT 13 08/02/2020 1430   BILITOT 0.5 08/02/2020 1430       Impression and Plan: Ms. Denise is a very pleasant 64 yo caucasian female with CLL. Her WBC and lymphocyte counts have continued to increase over the last several months and it looks like we are now going to have to start her on treatment.  She will complete 3 months of Calquence (started 05/23/2020) and then add Venetoclax for one full year. Dr. Marin Olp will place Venteclax order to start in mid September. We will see her again in 8 weeks for follow-up and see how she is tolerating Venetoclax being added to her regimen.  She can contact our office with any questions or concerns.   Laverna Peace, NP 8/25/20213:11 PM

## 2020-08-03 ENCOUNTER — Telehealth: Payer: Self-pay | Admitting: Pharmacist

## 2020-08-03 ENCOUNTER — Telehealth: Payer: Self-pay | Admitting: Pharmacy Technician

## 2020-08-03 ENCOUNTER — Other Ambulatory Visit: Payer: Self-pay | Admitting: Hematology & Oncology

## 2020-08-03 DIAGNOSIS — C9110 Chronic lymphocytic leukemia of B-cell type not having achieved remission: Secondary | ICD-10-CM

## 2020-08-03 LAB — LACTATE DEHYDROGENASE: LDH: 205 U/L — ABNORMAL HIGH (ref 98–192)

## 2020-08-03 MED ORDER — VENETOCLAX 10 & 50 & 100 MG PO TBPK: 50.0000 mg | ORAL_TABLET | Freq: Every day | ORAL | 0 refills | Status: DC

## 2020-08-03 NOTE — Telephone Encounter (Signed)
Oral Oncology Pharmacist Encounter  Received new prescription for Venclexta (venetoclax) for the treatment of CLL in conjunction with acalabrutinib, planned duration until disease control or unacceptable drug toxicity. Planned start next month.  CBC/CMP from 08/02/20 assessed, lymphocyte count elevated but is coming down. CMP, Phos, and uric acid will need to be monitored with treatment initiation. Prescription dose and frequency assessed.   Current medication list in Epic reviewed, no DDIs with venetoclax identified.  Above DDI information sent to MD in a staff message.  Evaluated chart and no patient barriers to medication adherence identified.   Prescription has been e-scribed to the [facility] for benefits analysis and approval.  Oral Oncology Clinic will continue to follow for insurance authorization, copayment issues, initial counseling and start date.  Darl Pikes, PharmD, BCPS, BCOP, CPP Hematology/Oncology Clinical Pharmacist Practitioner [facility] [phone]  08/03/2020 4:28 PM

## 2020-08-03 NOTE — Telephone Encounter (Signed)
Oral Oncology Patient Advocate Encounter  Received notification from [facility] that prior authorization for Venclexta is required.  PA submitted on CoverMyMeds Key [id number] Status is pending  Oral Oncology Clinic will continue to follow.  [place] Patient [place] Phone [phone] Fax [phone] 08/03/2020 11:48 AM

## 2020-08-08 MED FILL — CALQUENCE 100 MG CAPSULE: 100 | 30 days supply | Qty: 60 | Fill #3

## 2020-08-08 NOTE — Telephone Encounter (Signed)
Oral Oncology Patient Advocate Encounter  Prior Authorization for Lynita Lombard has been approved.    PA# [id number] Effective dates: 07/20/20 to "until further notice"  Patients co-pay is $141.10 (for Starter Pack)  Oral Oncology Clinic will continue to follow.   [place] Patient [place] Phone [phone] Fax [phone] 08/08/2020 10:23 AM

## 2020-08-10 MED ORDER — VENETOCLAX 10 & 50 & 100 MG PO TBPK: ORAL_TABLET | ORAL | 0 refills | Status: DC

## 2020-08-15 ENCOUNTER — Telehealth: Payer: Self-pay | Admitting: *Deleted

## 2020-08-15 NOTE — Telephone Encounter (Signed)
-----   Message from Sloan Leiter, MD sent at 08/11/2020  8:45 AM EDT ----- Please let patient know she has osteopenia, recommend she ensure daily intake of calcium 1200 mg and Vitamin D 600 mg. Repeat screen 2 years.

## 2020-08-15 NOTE — Telephone Encounter (Signed)
Message left on cell phone that her recent BMD did show Osteopenia and recommendations are to take Calcium 1200mg  and Vitamin D 600 mg daily.  BMD will be repeated in 2 years.

## 2020-08-24 ENCOUNTER — Other Ambulatory Visit: Payer: Self-pay | Admitting: *Deleted

## 2020-08-24 DIAGNOSIS — C9110 Chronic lymphocytic leukemia of B-cell type not having achieved remission: Secondary | ICD-10-CM

## 2020-08-24 MED ORDER — ALLOPURINOL 100 MG PO TABS: 100.0000 mg | ORAL_TABLET | Freq: Every day | ORAL | 2 refills | Status: DC

## 2020-08-24 MED FILL — VENCLEXTA STARTING PACK: 10 & 50 & 1 | 28 days supply | Qty: 42 | Fill #0

## 2020-08-24 NOTE — Telephone Encounter (Signed)
Oral Oncology Patient Advocate Encounter  I spoke with Mystery this afternoon to set up delivery of Venclexta.  Address verified for shipment.  Venclexta will be filled through [facility] and mailed 08/24/20 for delivery 08/25/20.    [facility] will call 7-10 days before next refill is due to complete adherence call and set up delivery of medication.     [place] Patient [place] Phone [phone] Fax [phone] 08/24/2020 2:23 PM

## 2020-08-28 ENCOUNTER — Telehealth: Payer: Self-pay | Admitting: Pharmacist

## 2020-08-28 NOTE — Telephone Encounter (Signed)
Oral Chemotherapy Pharmacy Student Encounter  Patient Education I spoke with patient on the phone 08/24/20 for overview of new oral chemotherapy medication: Venclexta (venetoclax) for the treatment of CLL in conjunction with acalabrutinib, planned duration until disease progression or unacceptable drug toxicity.   Counseled patient on administration, dosing, side effects, monitoring, drug-food interactions, safe handling, storage, and disposal. Patient will take 20 mg by mouth daily for 7 days, THEN 50 mg daily for 7 days, THEN 100 mg daily for 7 days, THEN 200 mg daily for 7 days. Take with food and water.  Side effects include but not limited to: tumor lysis syndrome, N/V, fatigue, electrolyte abnormalities, diarrhea. Discussed TLS at length with Sara Sanford as she had looked it up beforehand and was apprehensive about the side effects. Educated on dose ramp-up and importance of hydration to minimize TLS risk and she expressed understanding.  Reviewed with patient importance of keeping a medication schedule and plan for any missed doses.  After discussion with patient no patient barriers to medication adherence identified.   Sara Sanford voiced understanding and appreciation. All questions answered.  Provided patient with [facility] phone number. Patient knows to call the office with questions or concerns. Oral Chemotherapy Navigation Clinic will continue to follow.   Laurey Arrow, PharmD Candidate 2022 [facility] Oral Chemotherapy [facility] [phone]  08/24/2020 9:23 AM

## 2020-08-30 IMAGING — CT CT CHEST W/ CM
2 of 5 series · 12 of 36 positions shown, 15 images · IV contrast (omnipaque)
Comparison: CT of 08/26/2019

CLINICAL DATA: Hematologic malignancy, assess for lymphadenopathy

EXAM:
CT CHEST, ABDOMEN, AND PELVIS WITH CONTRAST
TECHNIQUE: Multidetector CT imaging of the chest, abdomen and pelvis was
performed following the standard protocol during bolus
administration of intravenous contrast.
CONTRAST:  100mL OMNIPAQUE IOHEXOL 300 MG/ML  SOLN

[Series 2: cap with 2 · axial · 0.98mm/px · z∈[-586,-86]mm · 9 of 126 slices shown, 12 images]
[im 13/126  mediastinal]
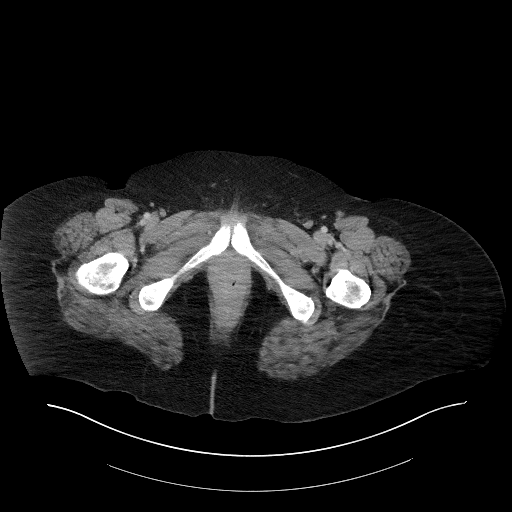
[im 13/126  lung]
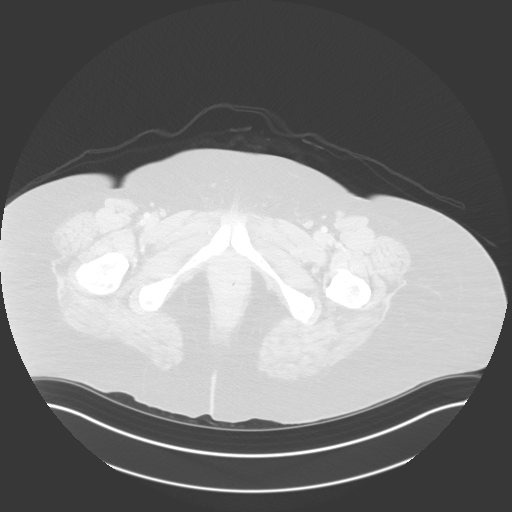
[im 26/126  lung]
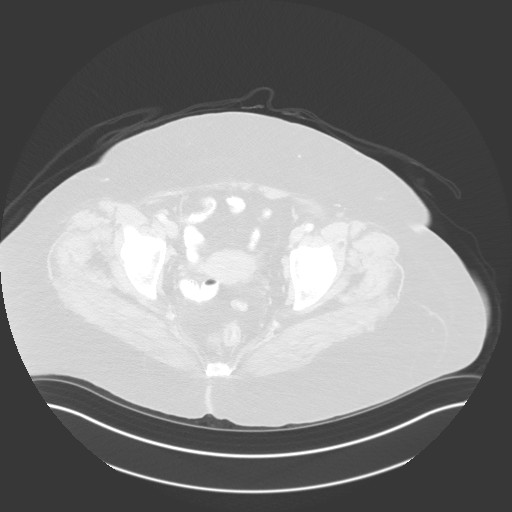
[im 38/126  lung]
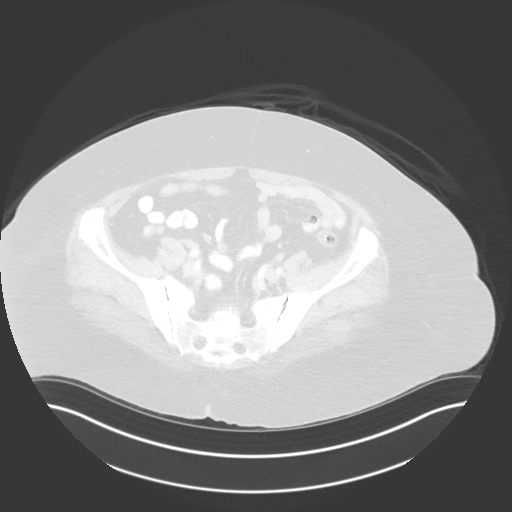
[im 51/126  lung]
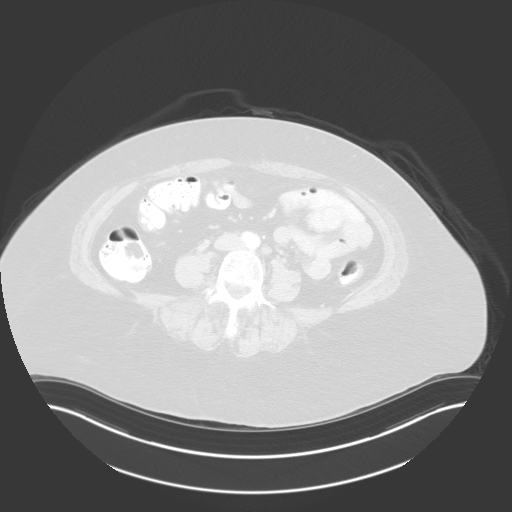
[im 63/126  mediastinal]
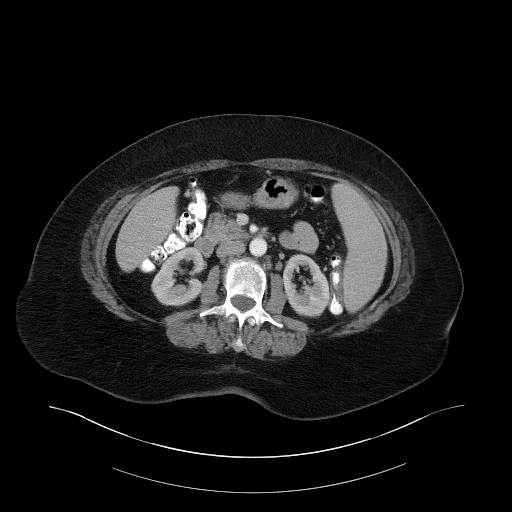
[im 63/126  lung]
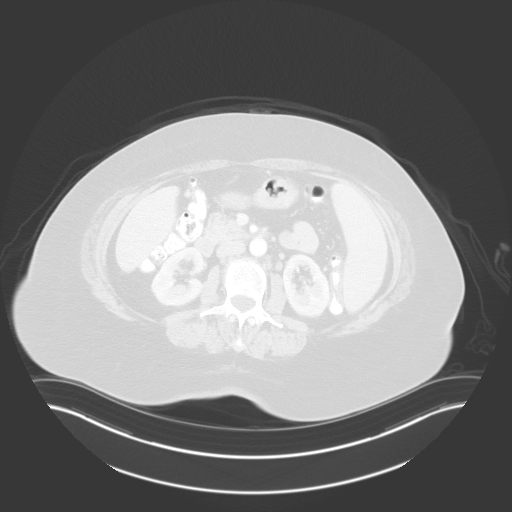
[im 76/126  lung]
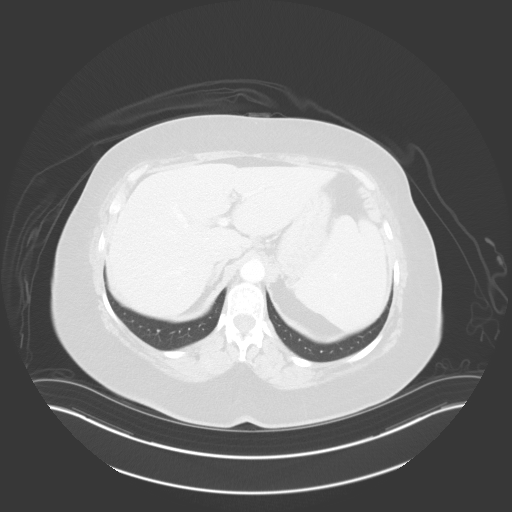
[im 88/126  lung]
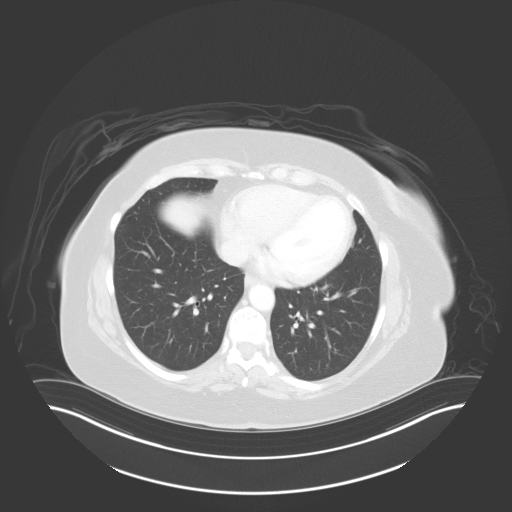
[im 101/126  lung]
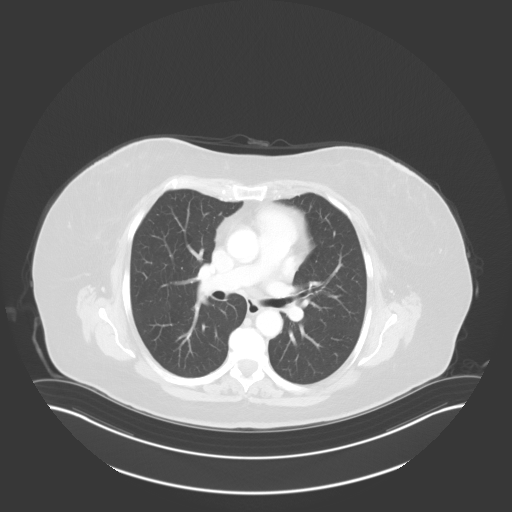
[im 113/126  mediastinal]
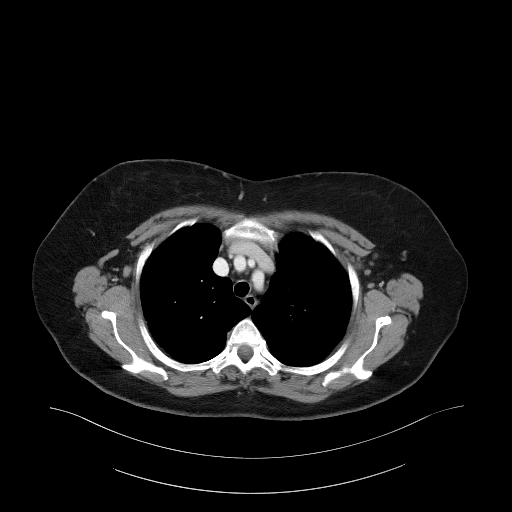
[im 113/126  lung]
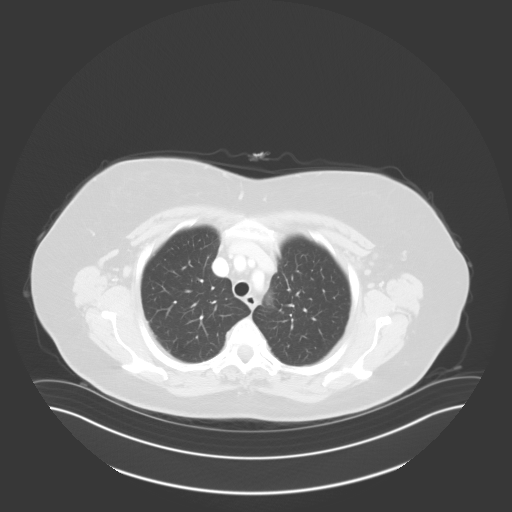

[Series 5: coronals · coronal · 1.01mm/px · 3 of 156 slices shown]
[im 32/156  lung]
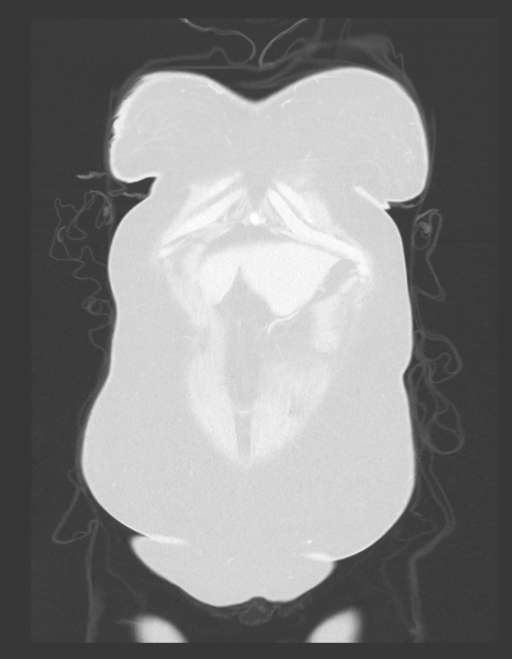
[im 63/156  lung]
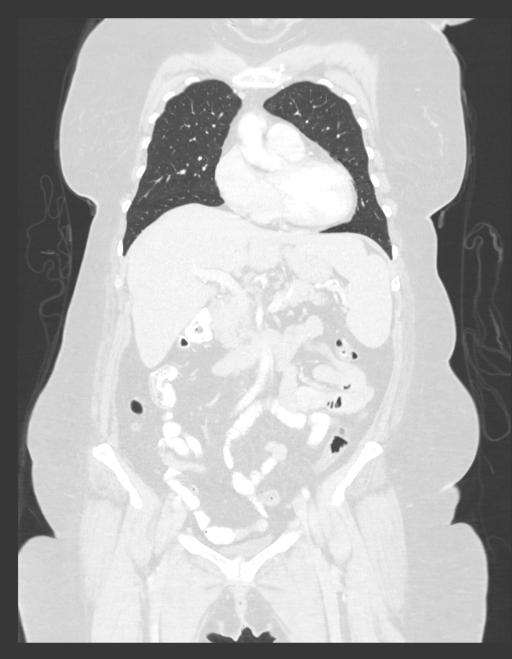
[im 94/156  lung]
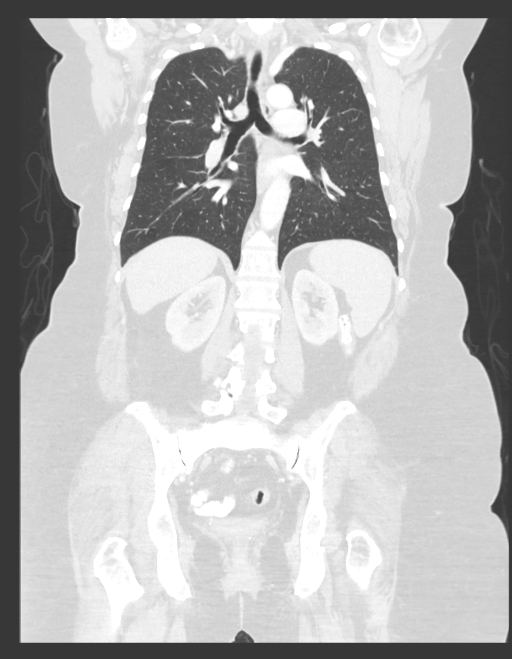

[12 of 36 positions shown; findings below may reference images not displayed]

FINDINGS: CT CHEST FINDINGS

Cardiovascular: Minimal atherosclerosis of the thoracic aorta. Heart
size normal. No pericardial effusion. Central pulmonary vasculature
is unremarkable. Limited assessment on venous phase imaging.

Mediastinum/Nodes: Mild enlargement of axillary lymph nodes on the
LEFT most retaining fatty hila however, similar mild enlargement on
the RIGHT. Thoracic inlet lymph nodes remain less than a cm. No
mediastinal or hilar lymphadenopathy.

(Image 10, series 2) 1 cm lymph node in the RIGHT axilla retained
fatty hilum previously 6-7 mm.

LEFT axillary lymph node (image 9, series 2) 1 cm previously
approximately 6-7 mm. Smaller lymph nodes track beneath the
subpectoral musculature on the LEFT. Small rounded lymph node in the
LEFT axilla in the lower axilla measuring 7 mm previously 5 mm.

Lungs/Pleura: Airways are patent.  Lungs are clear.

Musculoskeletal: No evidence of chest wall mass. See below for full
musculoskeletal details.

CT ABDOMEN PELVIS FINDINGS

Hepatobiliary: No focal hepatic lesion. Post cholecystectomy without
biliary ductal distension.

Pancreas: Pancreas normal without focal lesion. No peripancreatic
stranding.

Spleen: Mild splenomegaly spleen slightly greater than 12 cm
craniocaudal extent but when measured in a similar fashion slightly
enlarged previously approximately 10 cm. No focal splenic lesion.

Adrenals/Urinary Tract: Adrenal glands are normal.

Low-density lesion in the interpolar RIGHT kidney compatible with
small cyst. No hydronephrosis. Symmetric renal enhancement. Urinary
bladder is normal.

Stomach/Bowel: No acute gastrointestinal process. Small nodular
lymph node in the mesorectal space (image 99, series 2) 6 mm.
Another ovoid area in the upper portion of the rectovaginal
space/cul-de-sac 1.7 x 0.8 cm blended in with the colon on the
previous exam. Finding appears stable compared to the previous
study.

Vascular/Lymphatic: No acute vascular process. 9 mm lymph node in
the LEFT retroperitoneum, common iliac lymph node (image 77, series
2) previously w 6 mm

Lymph nodes remain less than a cm in the retroperitoneum minimally
increased in conspicuity

LEFT external iliac lymph node 9 mm (image 93, series 2) previously
7 mm.

Reproductive: Uterus and adnexa are unremarkable.

Other: No abdominal wall hernia or abnormality. No abdominopelvic
ascites.

Musculoskeletal: No acute musculoskeletal process. Pars defects at
L5 with mild anterolisthesis of L5 on S1 unchanged.
IMPRESSION: 1. Minimal increase in size of lymph nodes in the chest, abdomen and
pelvis as described most remaining less than a cm and with mild
splenic enlargement compared to the prior study.
2. Mesorectal/pelvic nodules are of uncertain significance. These
appear separate from the colon but could relate to prior
inflammation and are unchanged when compared to recent imaging.
Given proximity to the colon would suggest correlation with recent
colonoscopy results or with colonoscopy as warranted if there are no
prior recent colonoscopic assessments.
3. Lobular contour of the liver is unchanged.  No focal lesion.
4. Aortic atherosclerosis.

Aortic Atherosclerosis ([id number]).

## 2020-08-31 ENCOUNTER — Other Ambulatory Visit: Payer: Self-pay

## 2020-08-31 DIAGNOSIS — C9110 Chronic lymphocytic leukemia of B-cell type not having achieved remission: Secondary | ICD-10-CM

## 2020-08-31 DIAGNOSIS — R300 Dysuria: Secondary | ICD-10-CM

## 2020-09-01 ENCOUNTER — Other Ambulatory Visit: Payer: Self-pay

## 2020-09-01 ENCOUNTER — Inpatient Hospital Stay: Payer: Medicare Other | Attending: Family

## 2020-09-01 DIAGNOSIS — R300 Dysuria: Secondary | ICD-10-CM | POA: Diagnosis not present

## 2020-09-01 DIAGNOSIS — C9110 Chronic lymphocytic leukemia of B-cell type not having achieved remission: Secondary | ICD-10-CM | POA: Diagnosis not present

## 2020-09-01 LAB — CMP (CANCER CENTER ONLY)
ALT: 10 U/L (ref 0–44)
AST: 15 U/L (ref 15–41)
Albumin: 4.1 g/dL (ref 3.5–5.0)
Alkaline Phosphatase: 73 U/L (ref 38–126)
Anion gap: 6 (ref 5–15)
BUN: 13 mg/dL (ref 8–23)
CO2: 28 mmol/L (ref 22–32)
Calcium: 9.2 mg/dL (ref 8.9–10.3)
Chloride: 107 mmol/L (ref 98–111)
Creatinine: 0.74 mg/dL (ref 0.44–1.00)
GFR, Est AFR Am: >60 mL/min (ref >60)
GFR, Estimated: >60 mL/min (ref >60)
Glucose, Bld: 95 mg/dL (ref 70–99)
Potassium: 4.6 mmol/L (ref 3.5–5.1)
Sodium: 141 mmol/L (ref 135–145)
Total Bilirubin: 0.5 mg/dL (ref 0.3–1.2)
Total Protein: 6 g/dL — ABNORMAL LOW (ref 6.5–8.1)

## 2020-09-01 LAB — URINALYSIS, COMPLETE (UACMP) WITH MICROSCOPIC
Bilirubin Urine: NEGATIVE
Glucose, UA: NEGATIVE mg/dL
Ketones, ur: NEGATIVE mg/dL
Leukocytes,Ua: NEGATIVE
Nitrite: NEGATIVE
Protein, ur: NEGATIVE mg/dL
Specific Gravity, Urine: 1.01 (ref 1.005–1.030)
pH: 6.5 (ref 5.0–8.0)

## 2020-09-01 LAB — PHOSPHORUS: Phosphorus: 3.9 mg/dL (ref 2.5–4.6)

## 2020-09-01 LAB — URIC ACID: Uric Acid, Serum: 4 mg/dL (ref 2.5–7.1)

## 2020-09-02 LAB — URINE CULTURE

## 2020-09-03 IMAGING — DX DG CHEST 2V
2 series · 2 of 2 positions shown · non-contrast
Comparison: 08/09/2019

CLINICAL DATA: Cough, fever

EXAM:
CHEST - 2 VIEW

[chest pa]
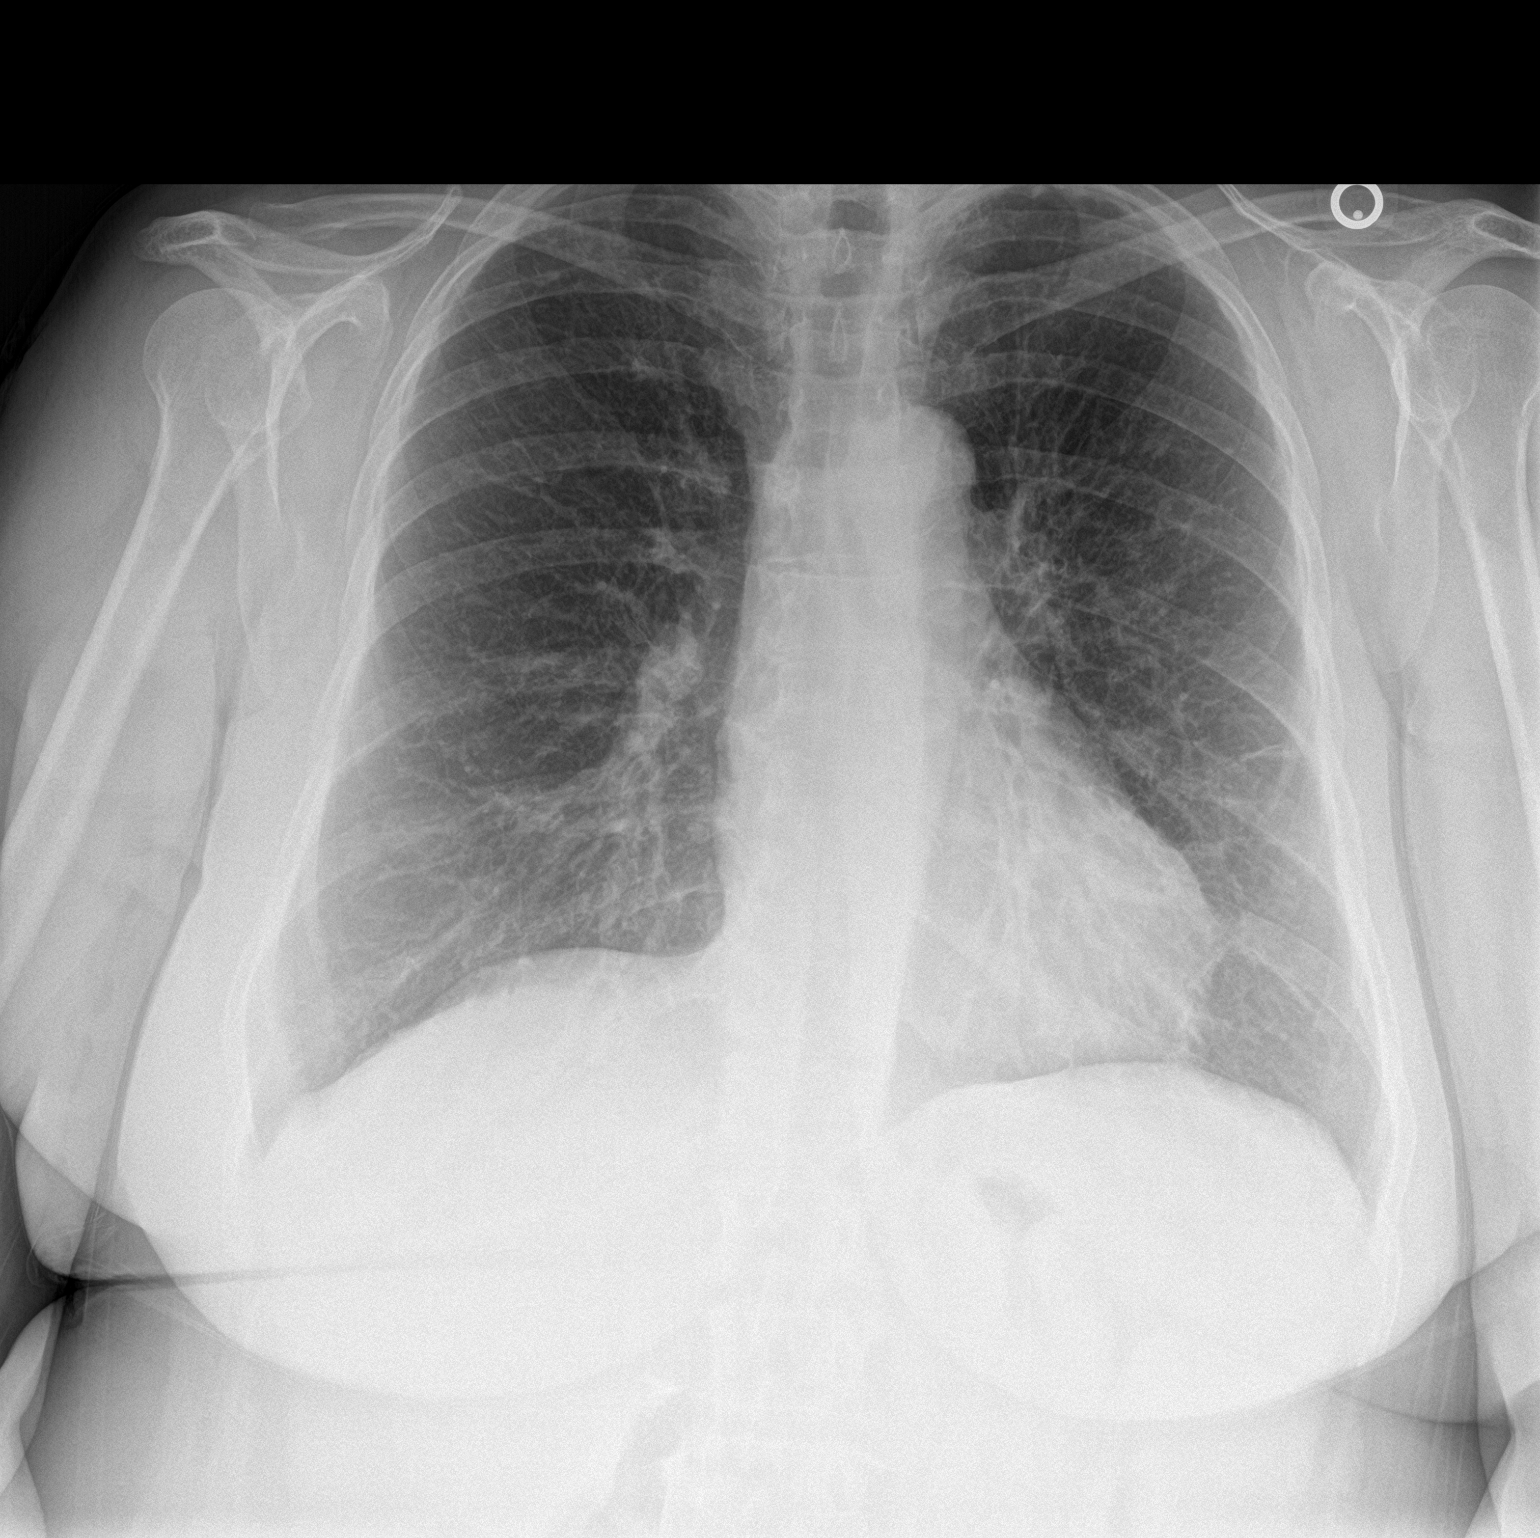

[chest lat]
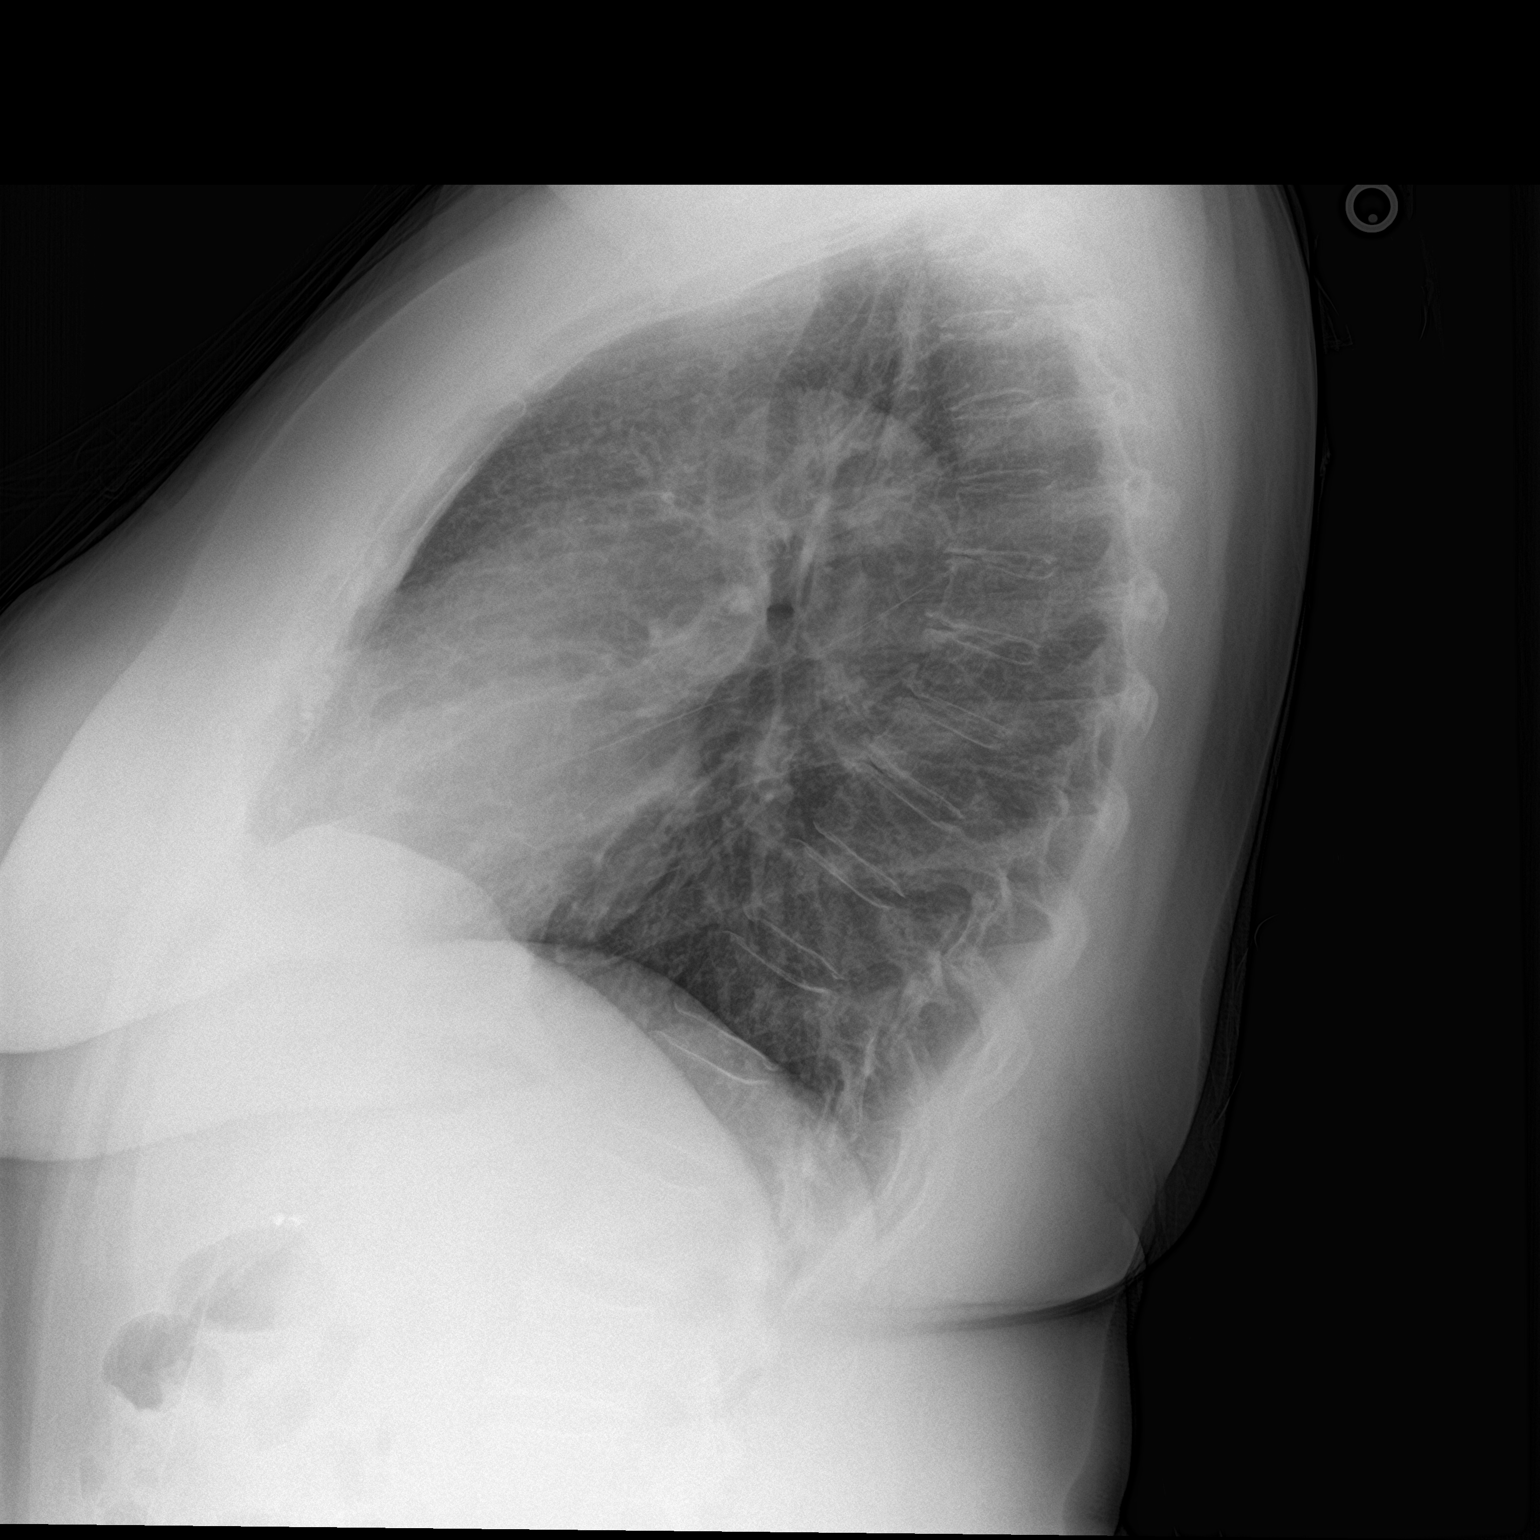

[2 of 2 positions shown; findings below may reference images not displayed]

FINDINGS: Scarring in the lingula. Right lung clear. Heart is normal size. No
effusions or acute bony abnormality.
IMPRESSION: No active cardiopulmonary disease.

## 2020-09-05 MED FILL — CALQUENCE 100 MG CAPSULE: 100 | 30 days supply | Qty: 60 | Fill #4

## 2020-09-08 ENCOUNTER — Other Ambulatory Visit: Payer: Self-pay

## 2020-09-08 ENCOUNTER — Inpatient Hospital Stay: Payer: Medicare Other | Attending: Family

## 2020-09-08 DIAGNOSIS — Z79899 Other long term (current) drug therapy: Secondary | ICD-10-CM | POA: Diagnosis not present

## 2020-09-08 DIAGNOSIS — Z881 Allergy status to other antibiotic agents status: Secondary | ICD-10-CM | POA: Insufficient documentation

## 2020-09-08 DIAGNOSIS — C9110 Chronic lymphocytic leukemia of B-cell type not having achieved remission: Secondary | ICD-10-CM | POA: Diagnosis not present

## 2020-09-08 LAB — CMP (CANCER CENTER ONLY)
ALT: 10 U/L (ref 0–44)
AST: 14 U/L — ABNORMAL LOW (ref 15–41)
Albumin: 4.1 g/dL (ref 3.5–5.0)
Alkaline Phosphatase: 79 U/L (ref 38–126)
Anion gap: 4 — ABNORMAL LOW (ref 5–15)
BUN: 9 mg/dL (ref 8–23)
CO2: 30 mmol/L (ref 22–32)
Calcium: 9.2 mg/dL (ref 8.9–10.3)
Chloride: 106 mmol/L (ref 98–111)
Creatinine: 0.68 mg/dL (ref 0.44–1.00)
GFR, Est AFR Am: >60 mL/min (ref >60)
GFR, Estimated: >60 mL/min (ref >60)
Glucose, Bld: 92 mg/dL (ref 70–99)
Potassium: 4.5 mmol/L (ref 3.5–5.1)
Sodium: 140 mmol/L (ref 135–145)
Total Bilirubin: 0.7 mg/dL (ref 0.3–1.2)
Total Protein: 5.9 g/dL — ABNORMAL LOW (ref 6.5–8.1)

## 2020-09-08 LAB — URIC ACID: Uric Acid, Serum: 3.3 mg/dL (ref 2.5–7.1)

## 2020-09-08 LAB — PHOSPHORUS: Phosphorus: 4.3 mg/dL (ref 2.5–4.6)

## 2020-09-13 ENCOUNTER — Other Ambulatory Visit: Payer: Self-pay | Admitting: Hematology & Oncology

## 2020-09-13 ENCOUNTER — Other Ambulatory Visit: Payer: Self-pay | Admitting: *Deleted

## 2020-09-13 DIAGNOSIS — C9110 Chronic lymphocytic leukemia of B-cell type not having achieved remission: Secondary | ICD-10-CM

## 2020-09-13 MED ORDER — VENETOCLAX 100 MG PO TABS: 200.0000 mg | ORAL_TABLET | Freq: Every day | ORAL | 3 refills | Status: DC

## 2020-09-15 ENCOUNTER — Inpatient Hospital Stay: Payer: Medicare Other

## 2020-09-15 ENCOUNTER — Other Ambulatory Visit: Payer: Self-pay

## 2020-09-15 DIAGNOSIS — C9110 Chronic lymphocytic leukemia of B-cell type not having achieved remission: Secondary | ICD-10-CM | POA: Diagnosis not present

## 2020-09-15 LAB — CBC WITH DIFFERENTIAL (CANCER CENTER ONLY)
Abs Immature Granulocytes: 0.03 10*3/uL (ref 0.00–0.07)
Basophils Absolute: 0 10*3/uL (ref 0.0–0.1)
Basophils Relative: 0 %
Eosinophils Absolute: 0 10*3/uL (ref 0.0–0.5)
Eosinophils Relative: 0 %
HCT: 34.6 % — ABNORMAL LOW (ref 36.0–46.0)
Hemoglobin: 11.1 g/dL — ABNORMAL LOW (ref 12.0–15.0)
Immature Granulocytes: 0 %
Lymphocytes Relative: 89 %
Lymphs Abs: 18.2 10*3/uL — ABNORMAL HIGH (ref 0.7–4.0)
MCH: 32.3 pg (ref 26.0–34.0)
MCHC: 32.1 g/dL (ref 30.0–36.0)
MCV: 100.6 fL — ABNORMAL HIGH (ref 80.0–100.0)
Monocytes Absolute: 0.3 10*3/uL (ref 0.1–1.0)
Monocytes Relative: 2 %
Neutro Abs: 1.8 10*3/uL (ref 1.7–7.7)
Neutrophils Relative %: 9 %
Platelet Count: 137 10*3/uL — ABNORMAL LOW (ref 150–400)
RBC: 3.44 MIL/uL — ABNORMAL LOW (ref 3.87–5.11)
RDW: 12.5 % (ref 11.5–15.5)
WBC Count: 20.4 10*3/uL — ABNORMAL HIGH (ref 4.0–10.5)
nRBC: 0 % (ref 0.0–0.2)

## 2020-09-15 LAB — CMP (CANCER CENTER ONLY)
ALT: 11 U/L (ref 0–44)
AST: 16 U/L (ref 15–41)
Albumin: 4.1 g/dL (ref 3.5–5.0)
Alkaline Phosphatase: 68 U/L (ref 38–126)
Anion gap: 6 (ref 5–15)
BUN: 13 mg/dL (ref 8–23)
CO2: 28 mmol/L (ref 22–32)
Calcium: 9.2 mg/dL (ref 8.9–10.3)
Chloride: 109 mmol/L (ref 98–111)
Creatinine: 0.68 mg/dL (ref 0.44–1.00)
GFR, Estimated: >60 mL/min (ref >60)
Glucose, Bld: 92 mg/dL (ref 70–99)
Potassium: 4.5 mmol/L (ref 3.5–5.1)
Sodium: 143 mmol/L (ref 135–145)
Total Bilirubin: 0.5 mg/dL (ref 0.3–1.2)
Total Protein: 5.8 g/dL — ABNORMAL LOW (ref 6.5–8.1)

## 2020-09-15 LAB — PHOSPHORUS: Phosphorus: 4 mg/dL (ref 2.5–4.6)

## 2020-09-15 LAB — URIC ACID: Uric Acid, Serum: 3.2 mg/dL (ref 2.5–7.1)

## 2020-09-18 MED FILL — VENCLEXTA 100 MG TABS: 100 | 30 days supply | Qty: 60 | Fill #0

## 2020-10-03 ENCOUNTER — Inpatient Hospital Stay (HOSPITAL_BASED_OUTPATIENT_CLINIC_OR_DEPARTMENT_OTHER): Payer: Medicare Other | Admitting: Hematology & Oncology

## 2020-10-03 ENCOUNTER — Other Ambulatory Visit: Payer: Self-pay

## 2020-10-03 ENCOUNTER — Encounter: Payer: Self-pay | Admitting: Hematology & Oncology

## 2020-10-03 ENCOUNTER — Telehealth: Payer: Self-pay | Admitting: Hematology & Oncology

## 2020-10-03 ENCOUNTER — Inpatient Hospital Stay: Payer: Medicare Other

## 2020-10-03 VITALS — BP 103/56 | HR 74 | Temp 98.2°F | Resp 18 | Wt 226.2 lb

## 2020-10-03 DIAGNOSIS — C9110 Chronic lymphocytic leukemia of B-cell type not having achieved remission: Secondary | ICD-10-CM | POA: Diagnosis not present

## 2020-10-03 LAB — CMP (CANCER CENTER ONLY)
ALT: 11 U/L (ref 0–44)
AST: 16 U/L (ref 15–41)
Albumin: 4.1 g/dL (ref 3.5–5.0)
Alkaline Phosphatase: 63 U/L (ref 38–126)
Anion gap: 4 — ABNORMAL LOW (ref 5–15)
BUN: 12 mg/dL (ref 8–23)
CO2: 29 mmol/L (ref 22–32)
Calcium: 9.2 mg/dL (ref 8.9–10.3)
Chloride: 107 mmol/L (ref 98–111)
Creatinine: 0.71 mg/dL (ref 0.44–1.00)
GFR, Estimated: >60 mL/min (ref >60)
Glucose, Bld: 95 mg/dL (ref 70–99)
Potassium: 4.2 mmol/L (ref 3.5–5.1)
Sodium: 140 mmol/L (ref 135–145)
Total Bilirubin: 0.6 mg/dL (ref 0.3–1.2)
Total Protein: 6 g/dL — ABNORMAL LOW (ref 6.5–8.1)

## 2020-10-03 LAB — CBC WITH DIFFERENTIAL (CANCER CENTER ONLY)
Abs Immature Granulocytes: 0.01 10*3/uL (ref 0.00–0.07)
Basophils Absolute: 0 10*3/uL (ref 0.0–0.1)
Basophils Relative: 0 %
Eosinophils Absolute: 0 10*3/uL (ref 0.0–0.5)
Eosinophils Relative: 0 %
HCT: 34.7 % — ABNORMAL LOW (ref 36.0–46.0)
Hemoglobin: 11.6 g/dL — ABNORMAL LOW (ref 12.0–15.0)
Immature Granulocytes: 0 %
Lymphocytes Relative: 75 %
Lymphs Abs: 6.1 10*3/uL — ABNORMAL HIGH (ref 0.7–4.0)
MCH: 32.4 pg (ref 26.0–34.0)
MCHC: 33.4 g/dL (ref 30.0–36.0)
MCV: 96.9 fL (ref 80.0–100.0)
Monocytes Absolute: 0.4 10*3/uL (ref 0.1–1.0)
Monocytes Relative: 4 %
Neutro Abs: 1.7 10*3/uL (ref 1.7–7.7)
Neutrophils Relative %: 21 %
Platelet Count: 114 10*3/uL — ABNORMAL LOW (ref 150–400)
RBC: 3.58 MIL/uL — ABNORMAL LOW (ref 3.87–5.11)
RDW: 12.7 % (ref 11.5–15.5)
WBC Count: 8.1 10*3/uL (ref 4.0–10.5)
nRBC: 0 % (ref 0.0–0.2)

## 2020-10-03 LAB — LACTATE DEHYDROGENASE: LDH: 176 U/L (ref 98–192)

## 2020-10-03 LAB — PHOSPHORUS: Phosphorus: 4.3 mg/dL (ref 2.5–4.6)

## 2020-10-03 LAB — URIC ACID: Uric Acid, Serum: 3.3 mg/dL (ref 2.5–7.1)

## 2020-10-03 MED ORDER — VENETOCLAX 100 MG PO TABS: 100.0000 mg | ORAL_TABLET | Freq: Every day | ORAL | 3 refills | Status: DC

## 2020-10-03 NOTE — Progress Notes (Signed)
Hematology and Oncology Follow Up Visit  RAVEEN WIESELER [id number] 16-Nov-1956 64 y.o. 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 Belanger is here today for follow-up.  She is doing pretty well right now.  She started the venetoclax about a month ago.  She is on 200 mg a day.  Her white cell counts come down quite nicely.  I think that we really can decrease the dose of venetoclax 100 mg a day.  She is still taking the Calquence twice a day.  She had no problems with diarrhea.  She has had no problems with nausea or vomiting.  There has been no fever.  She has had no cough or shortness of breath.  There has been no leg swelling.  She has had no bruising.  She does have a couple small hematomas on her skin.  This is really not all that surprising.  I would have to say that overall, her performance status is probably ECOG 1 right now.    Medications:  Allergies as of 10/03/2020      Reactions   Propoxyphene Anaphylaxis, Shortness Of Breath, Other (See Comments)   Hallucinations   Nitrofurantoin Diarrhea, Rash   Welts with blisters      Medication List       Accurate as of October 03, 2020  2:17 PM. If you have any questions, ask your nurse or doctor.        allopurinol 100 MG tablet Commonly known as: ZYLOPRIM Take 1 tablet (100 mg total) by mouth daily.   Calquence 100 MG capsule Generic drug: acalabrutinib Take 1 capsule (100 mg total) by mouth 2 (two) times daily.   clobetasol cream 0.05 % Commonly known as: TEMOVATE APPLY  CREAM TWICE TO THREE  TIMES A WEEK AT  NIGHT  AS  DIRECTED   FLUoxetine 20 MG capsule Commonly known as: PROZAC Take 1 capsule by mouth once daily   folic acid 1 MG tablet Commonly known as: FOLVITE Take 2 mg by mouth daily.   omeprazole 20 MG capsule Commonly known as: PriLOSEC Take one cap each morning 30 minutes before food.  Take one cap (before eating) each evening for one week.   sulfamethoxazole-trimethoprim 400-80 MG tablet Commonly known as: BACTRIM Take 1 tablet by mouth daily.   Venclexta Starting Pack 10 & 50 & 100 MG Tbpk Generic drug: venetoclax TAKE 20 MG BY MOUTH DAILY FOR 7 DAYS, THEN 50 MG DAILY FOR 7 DAYS, THEN 100 MG DAILY FOR 7 DAYS, THEN 200 MG DAILY FOR 7 DAYS. TAKE WITH FOOD AND WATER   venetoclax 100 MG Tabs Take 200 mg by mouth daily.   vitamin B-12 1000 MCG tablet Commonly known as: CYANOCOBALAMIN Take 1,000 mcg by mouth daily.       Allergies:  Allergies  Allergen Reactions  . Propoxyphene Anaphylaxis, Shortness Of Breath and Other (See Comments)    Hallucinations   . Nitrofurantoin Diarrhea and Rash    Welts with blisters    Past Medical History, Surgical history, Social history, and Family History were reviewed and updated.  Review of Systems: Review of Systems  Constitutional: Negative.   HENT: Negative.   Eyes: Negative.   Respiratory: Negative.   Cardiovascular: Negative.   Gastrointestinal: Negative.   Genitourinary: Negative.   Musculoskeletal: Negative.   Skin: Negative.   Neurological: Negative.   Endo/Heme/Allergies: Negative.   Psychiatric/Behavioral: Negative.      Physical Exam:  weight is 226 lb 4 oz (102.6 kg). Her oral temperature is 98.2 F (36.8 C). Her blood pressure is 103/56 (abnormal) and her pulse is 74. Her respiration is 18 and oxygen saturation is 100%.   Wt Readings from Last 3 Encounters:  10/03/20 226 lb 4 oz (102.6 kg)  08/02/20 227 lb 0.6 oz (103 kg)  07/20/20 227 lb (103 kg)    Physical Exam Vitals reviewed.   HENT:     Head: Normocephalic and atraumatic.  Eyes:     Pupils: Pupils are equal, round, and reactive to light.  Cardiovascular:     Rate and Rhythm: Normal rate and regular rhythm.     Heart sounds: Normal heart sounds.  Pulmonary:     Effort: Pulmonary effort is normal.     Breath sounds: Normal breath sounds.  Abdominal:     General: Bowel sounds are normal.     Palpations: Abdomen is soft.  Musculoskeletal:        General: No tenderness or deformity. Normal range of motion.     Cervical back: Normal range of motion.  Lymphadenopathy:     Cervical: No cervical adenopathy.  Skin:    General: Skin is warm and dry.     Findings: No erythema or rash.  Neurological:     Mental Status: She is alert and oriented to person, place, and time.  Psychiatric:        Behavior: Behavior normal.        Thought Content: Thought content normal.        Judgment: Judgment normal.      Lab Results  Component Value Date   WBC 8.1 10/03/2020   HGB 11.6 (L) 10/03/2020   HCT 34.7 (L) 10/03/2020   MCV 96.9 10/03/2020   PLT 114 (L) 10/03/2020   Lab Results  Component Value Date   FERRITIN 25 05/15/2020   IRON 89 05/15/2020   TIBC 344 05/15/2020   UIBC 255 05/15/2020   IRONPCTSAT 26 05/15/2020   Lab Results  Component Value Date   RETICCTPCT 2.5 05/15/2020   RBC 3.58 (L) 10/03/2020   No results found for: KPAFRELGTCHN, LAMBDASER, KAPLAMBRATIO No results found for: IGGSERUM, IGA, IGMSERUM No results found for: [name], SPEI   Chemistry      Component Value Date/Time   NA 140 10/03/2020 1251   K 4.2 10/03/2020 1251   CL 107 10/03/2020 1251   CO2 29 10/03/2020 1251   BUN 12 10/03/2020 1251   CREATININE 0.71 10/03/2020 1251   CREATININE 0.69 02/01/2020 1149      Component Value Date/Time   CALCIUM 9.2 10/03/2020 1251   ALKPHOS 63 10/03/2020 1251   AST 16 10/03/2020 1251   ALT 11 10/03/2020 1251   BILITOT 0.6  10/03/2020 1251       Impression and Plan: Ms. Petersen is a very pleasant 64 yo caucasian female with CLL.  I think she has relatively decent prognostic markers.  She has responded quite nicely to the acalabrutinib/venetoclax combination.  Now, I suspect that her percentage of lymphocytes will start going down.  Again we will decrease the dose of the venetoclax  to 100 mg a day.  I think this will be effective for [place].  I would like to see her back right after Thanksgiving.  I think this would be a good time to see her back.  I don't see that we have to do any scans on her right now.  Her last scans were done back in June.  We may consider doing these in December.    Volanda Napoleon, MD 10/26/20212:17 PM

## 2020-10-03 NOTE — Telephone Encounter (Signed)
Appointments scheduled calendar printed per 10/26 los

## 2020-10-10 MED FILL — CALQUENCE 100 MG CAPSULE: 100 | 30 days supply | Qty: 60 | Fill #5

## 2020-10-16 ENCOUNTER — Encounter: Payer: Self-pay | Admitting: Physician Assistant

## 2020-10-16 ENCOUNTER — Ambulatory Visit (INDEPENDENT_AMBULATORY_CARE_PROVIDER_SITE_OTHER): Payer: Medicare Other | Admitting: Physician Assistant

## 2020-10-16 VITALS — BP 121/50 | HR 88 | Temp 98.5°F | Ht 64.0 in | Wt 226.0 lb

## 2020-10-16 DIAGNOSIS — N390 Urinary tract infection, site not specified: Secondary | ICD-10-CM | POA: Diagnosis not present

## 2020-10-16 DIAGNOSIS — R300 Dysuria: Secondary | ICD-10-CM | POA: Diagnosis not present

## 2020-10-16 DIAGNOSIS — N3001 Acute cystitis with hematuria: Secondary | ICD-10-CM | POA: Diagnosis not present

## 2020-10-16 DIAGNOSIS — R350 Frequency of micturition: Secondary | ICD-10-CM | POA: Diagnosis not present

## 2020-10-16 LAB — POCT URINALYSIS DIP (CLINITEK)
Bilirubin, UA: NEGATIVE
Glucose, UA: NEGATIVE mg/dL
Ketones, POC UA: NEGATIVE mg/dL
Nitrite, UA: POSITIVE — AB
Spec Grav, UA: 1.025 (ref 1.010–1.025)
Urobilinogen, UA: 0.2 E.U./dL
pH, UA: 5.5 (ref 5.0–8.0)

## 2020-10-16 MED ORDER — CEFTRIAXONE SODIUM 1 G IJ SOLR
1.0000 g | INTRAMUSCULAR | Status: DC
  Administered 2020-10-16: 1 g via INTRAMUSCULAR

## 2020-10-16 MED ORDER — CEPHALEXIN 500 MG PO CAPS: 500.0000 mg | ORAL_CAPSULE | Freq: Two times a day (BID) | ORAL | 0 refills | Status: DC

## 2020-10-16 NOTE — Patient Instructions (Signed)
Urinary Tract Infection, Adult A urinary tract infection (UTI) is an infection of any part of the urinary tract. The urinary tract includes:  The kidneys.  The ureters.  The bladder.  The urethra. These organs make, store, and get rid of pee (urine) in the body. What are the causes? This is caused by germs (bacteria) in your genital area. These germs grow and cause swelling (inflammation) of your urinary tract. What increases the risk? You are more likely to develop this condition if:  You have a small, thin tube (catheter) to drain pee.  You cannot control when you pee or poop (incontinence).  You are female, and: ? You use these methods to prevent pregnancy:  A medicine that kills sperm (spermicide).  A device that blocks sperm (diaphragm). ? You have low levels of a female hormone (estrogen). ? You are pregnant.  You have genes that add to your risk.  You are sexually active.  You take antibiotic medicines.  You have trouble peeing because of: ? A prostate that is bigger than normal, if you are female. ? A blockage in the part of your body that drains pee from the bladder (urethra). ? A kidney stone. ? A nerve condition that affects your bladder (neurogenic bladder). ? Not getting enough to drink. ? Not peeing often enough.  You have other conditions, such as: ? Diabetes. ? A weak disease-fighting system (immune system). ? Sickle cell disease. ? Gout. ? Injury of the spine. What are the signs or symptoms? Symptoms of this condition include:  Needing to pee right away (urgently).  Peeing often.  Peeing small amounts often.  Pain or burning when peeing.  Blood in the pee.  Pee that smells bad or not like normal.  Trouble peeing.  Pee that is cloudy.  Fluid coming from the vagina, if you are female.  Pain in the belly or lower back. Other symptoms include:  Throwing up (vomiting).  No urge to eat.  Feeling mixed up (confused).  Being tired  and grouchy (irritable).  A fever.  Watery poop (diarrhea). How is this treated? This condition may be treated with:  Antibiotic medicine.  Other medicines.  Drinking enough water. Follow these instructions at home:  Medicines  Take over-the-counter and prescription medicines only as told by your doctor.  If you were prescribed an antibiotic medicine, take it as told by your doctor. Do not stop taking it even if you start to feel better. General instructions  Make sure you: ? Pee until your bladder is empty. ? Do not hold pee for a long time. ? Empty your bladder after sex. ? Wipe from front to back after pooping if you are a female. Use each tissue one time when you wipe.  Drink enough fluid to keep your pee pale yellow.  Keep all follow-up visits as told by your doctor. This is important. Contact a doctor if:  You do not get better after 1-2 days.  Your symptoms go away and then come back. Get help right away if:  You have very bad back pain.  You have very bad pain in your lower belly.  You have a fever.  You are sick to your stomach (nauseous).  You are throwing up. Summary  A urinary tract infection (UTI) is an infection of any part of the urinary tract.  This condition is caused by germs in your genital area.  There are many risk factors for a UTI. These include having a small, thin   tube to drain pee and not being able to control when you pee or poop.  Treatment includes antibiotic medicines for germs.  Drink enough fluid to keep your pee pale yellow. This information is not intended to replace advice given to you by your health care provider. Make sure you discuss any questions you have with your health care provider. Document Revised: 11/12/2018 Document Reviewed: 06/04/2018 Elsevier Patient Education  2020 Elsevier Inc.  

## 2020-10-16 NOTE — Progress Notes (Signed)
Subjective:    Patient ID: Sara Sanford, female    DOB: [birth year], 64 y.o.   MRN: [id number]  HPI  Pt is a 64 yo female with history of recurrent UTIs who presents to the clinic with dysuria, right sided flank pain, increase in urination that started yesterday. She is actively being treated for CLL. She has not taken anything to make symptoms better. No fever, chills, body aches. She has been a little nauseated but no vomiting. She just does not feel "well".   .. Active Ambulatory Problems    Diagnosis Date Noted  . OBESITY 05/22/2009  . GERD 05/22/2009  . POLYARTHRITIS 06/19/2010  . LEG EDEMA, BILATERAL 05/22/2009  . Abdominal aortic aneurysm ([place]) 08/01/2014  . GAD (generalized anxiety disorder) 02/17/2017  . Recurrent UTI 06/30/2019  . CLL (chronic lymphocytic leukemia) ([place]) 08/12/2019  . Macrocytic anemia 09/07/2019  . Primary osteoarthritis of left knee 09/09/2019  . Left leg pain 09/09/2019  . Cholelithiasis 08/17/2014  . Rash and nonspecific skin eruption 01/03/2020  . Hyperlipidemia 01/20/2020   Resolved Ambulatory Problems    Diagnosis Date Noted  . Acute bronchitis 06/19/2010  . HEMATOCHEZIA 08/11/2010  . Abdominal pain, generalized 08/11/2010  . UTI 12/26/2010  . Diarrhea 06/13/2011  . ABDOMINAL PAIN 06/13/2011  . GERD (gastroesophageal reflux disease) 08/03/2012  . Urinary tract infection due to Proteus 02/08/2016  . Hyperlipidemia associated with type 2 diabetes mellitus ([place]) 01/20/2020   Past Medical History:  Diagnosis Date  . Allergy   . Anxiety   . Arthritis   . Chronic lymphocytic leukemia ([place])   . Gallstones   . Heart burn   . Kidney stones   . Lichen sclerosus   . Obesity   . Post-menopausal   . UTI (urinary tract infection)       Review of Systems  All other systems reviewed and are negative.      Objective:   Physical Exam Vitals reviewed.  Constitutional:      Appearance: Normal appearance.  Cardiovascular:      Rate and Rhythm: Normal rate and regular rhythm.     Pulses: Normal pulses.  Pulmonary:     Effort: Pulmonary effort is normal.  Abdominal:     General: Bowel sounds are normal. There is no distension.     Palpations: Abdomen is soft.     Tenderness: There is no right CVA tenderness, left CVA tenderness or guarding.  Neurological:     General: No focal deficit present.     Mental Status: She is alert and oriented to person, place, and time.  Psychiatric:        Mood and Affect: Mood normal.          .. Results for orders placed or performed in visit on 10/16/20  POCT URINALYSIS DIP (CLINITEK)  Result Value Ref Range   Color, UA yellow yellow   Clarity, UA clear clear   Glucose, UA negative negative mg/dL   Bilirubin, UA negative negative   Ketones, POC UA negative negative mg/dL   Spec Grav, UA 1.025 1.010 - 1.025   Blood, UA moderate (A) negative   pH, UA 5.5 5.0 - 8.0   POC PROTEIN,UA trace negative, trace   Urobilinogen, UA 0.2 0.2 or 1.0 E.U./dL   Nitrite, UA Positive (A) Negative   Leukocytes, UA Moderate (2+) (A) Negative    Assessment & Plan:  Marland KitchenMarland KitchenBritne was seen today for dysuria and urinary frequency.  Diagnoses and all orders for this  visit:  Acute cystitis with hematuria -     cephALEXin (KEFLEX) 500 MG capsule; Take 1 capsule (500 mg total) by mouth 2 (two) times daily. For 7 days. -     cefTRIAXone (ROCEPHIN) injection 1 g  Burning with urination -     POCT URINALYSIS DIP (CLINITEK) -     Urine Culture -     cefTRIAXone (ROCEPHIN) injection 1 g  Frequency of urination -     POCT URINALYSIS DIP (CLINITEK) -     Urine Culture -     cefTRIAXone (ROCEPHIN) injection 1 g  Recurrent UTI -     Urine Culture -     cephALEXin (KEFLEX) 500 MG capsule; Take 1 capsule (500 mg total) by mouth 2 (two) times daily. For 7 days. -     cefTRIAXone (ROCEPHIN) injection 1 g  Other orders -     Discontinue: cephALEXin (KEFLEX) 500 MG capsule; Take 1 capsule (500 mg  total) by mouth 2 (two) times daily. For 7 days.   UA dipstick positive for blood, leuks, nitrates. Referred back to last urine culture and bacteria was sensitive to ceftriaxone and the family of cephalosporins. Started with IM 1g of rocephin and start keflex tomorrow. Interaction check was done since patient is on medications that immunosuppress for CLL. If symptoms not improving or worsening let us know. Will culture to confirm the antibiotics will treat bacteria. HO given. Increase hydration.

## 2020-10-17 MED FILL — VENCLEXTA 100 MG TABS: 100 | 30 days supply | Qty: 60 | Fill #1

## 2020-10-18 ENCOUNTER — Telehealth: Payer: Self-pay | Admitting: Neurology

## 2020-10-18 NOTE — Telephone Encounter (Signed)
Patient made aware.

## 2020-10-18 NOTE — Telephone Encounter (Signed)
Hold cephalexin. Culture should be back today or tomorrow. Start probiotic OTC daily and eat lots of yogurt. Ok to take immodium for next day or so.

## 2020-10-18 NOTE — Telephone Encounter (Signed)
Patient seen on 10/16/2020 and given Cephalexin 500 mg for UTI. She states she is having uncontrollable diarrhea. Any way to change medications?   She was also given Rocephin, 1 gm in the office. She needs something okay to take with her cancer drugs. Urine culture not back yet.

## 2020-10-19 ENCOUNTER — Other Ambulatory Visit: Payer: Self-pay | Admitting: Family Medicine

## 2020-10-19 LAB — URINE CULTURE
MICRO NUMBER:: 11176358
SPECIMEN QUALITY:: ADEQUATE

## 2020-10-19 MED ORDER — CIPROFLOXACIN HCL 250 MG PO TABS: 250.0000 mg | ORAL_TABLET | Freq: Two times a day (BID) | ORAL | 0 refills | Status: DC

## 2020-10-23 MED ORDER — NITROFURANTOIN MONOHYD MACRO 100 MG PO CAPS: 100.0000 mg | ORAL_CAPSULE | Freq: Two times a day (BID) | ORAL | 0 refills | Status: DC

## 2020-10-23 NOTE — Progress Notes (Signed)
Called and left the patient a message, asked her to call back and confirm her medication list with me. I did notice that nitrofurantoin is on her sensitivity list as giving her diarrhea and a rash.. so I asked that she also confirm her allergies with [place].

## 2020-10-23 NOTE — Progress Notes (Signed)
Okay, it looks like we do not have a lot of options.  It is resistant to all the penicillin products and sulfa.  If she cannot take Macrobid or ciprofloxacin, then she will have to come in for Rocephin injections 3 days in a row.

## 2020-10-23 NOTE — Progress Notes (Signed)
Patient called, she is not able to take the cipro that was sent in for her UTI due to interaction with her cancer medication, Venetoclax. Requesting an alternative to be sent in

## 2020-10-23 NOTE — Progress Notes (Signed)
I sent in a new prescription for Macrobid but this is based on her current medication list which is currently not up-to-date so we may need to verify that with her but I did go ahead and send over a new prescription for her today.  Again I cannot promise that there is not a contraindication as clearly I do not have a thorough med list

## 2020-10-26 NOTE — Progress Notes (Signed)
Spoke with patient, she states she is taking the Macrobid. She is having some rash that itchy but no swelling or "extreme" reaction. I advised patient to reach out to pharmacist to see what may be safe to take with her other medications such as benadryl in case she feels too itchy. Patient was instructed to call me back if any new or worsening of symptoms. I advised her she would need to finish the whole course of antibiotic so if at any time she feels she needs to stop it, contact office.

## 2020-11-08 ENCOUNTER — Telehealth: Payer: Self-pay | Admitting: Hematology & Oncology

## 2020-11-08 ENCOUNTER — Other Ambulatory Visit: Payer: Self-pay

## 2020-11-08 ENCOUNTER — Inpatient Hospital Stay (HOSPITAL_BASED_OUTPATIENT_CLINIC_OR_DEPARTMENT_OTHER): Payer: Medicare Other | Admitting: Hematology & Oncology

## 2020-11-08 ENCOUNTER — Encounter: Payer: Self-pay | Admitting: Hematology & Oncology

## 2020-11-08 ENCOUNTER — Inpatient Hospital Stay: Payer: Medicare Other | Attending: Family

## 2020-11-08 VITALS — BP 105/46 | HR 63 | Temp 98.3°F | Resp 20 | Wt 227.0 lb

## 2020-11-08 DIAGNOSIS — Z881 Allergy status to other antibiotic agents status: Secondary | ICD-10-CM | POA: Insufficient documentation

## 2020-11-08 DIAGNOSIS — C9110 Chronic lymphocytic leukemia of B-cell type not having achieved remission: Secondary | ICD-10-CM | POA: Diagnosis present

## 2020-11-08 LAB — CMP (CANCER CENTER ONLY)
ALT: 12 U/L (ref 0–44)
AST: 15 U/L (ref 15–41)
Albumin: 4 g/dL (ref 3.5–5.0)
Alkaline Phosphatase: 68 U/L (ref 38–126)
Anion gap: 6 (ref 5–15)
BUN: 15 mg/dL (ref 8–23)
CO2: 28 mmol/L (ref 22–32)
Calcium: 9 mg/dL (ref 8.9–10.3)
Chloride: 106 mmol/L (ref 98–111)
Creatinine: 0.66 mg/dL (ref 0.44–1.00)
GFR, Estimated: >60 mL/min (ref >60)
Glucose, Bld: 97 mg/dL (ref 70–99)
Potassium: 4.2 mmol/L (ref 3.5–5.1)
Sodium: 140 mmol/L (ref 135–145)
Total Bilirubin: 0.5 mg/dL (ref 0.3–1.2)
Total Protein: 5.9 g/dL — ABNORMAL LOW (ref 6.5–8.1)

## 2020-11-08 LAB — CBC WITH DIFFERENTIAL (CANCER CENTER ONLY)
Abs Immature Granulocytes: 0.03 10*3/uL (ref 0.00–0.07)
Basophils Absolute: 0 10*3/uL (ref 0.0–0.1)
Basophils Relative: 0 %
Eosinophils Absolute: 0 10*3/uL (ref 0.0–0.5)
Eosinophils Relative: 1 %
HCT: 36 % (ref 36.0–46.0)
Hemoglobin: 11.9 g/dL — ABNORMAL LOW (ref 12.0–15.0)
Immature Granulocytes: 1 %
Lymphocytes Relative: 70 %
Lymphs Abs: 4.2 10*3/uL — ABNORMAL HIGH (ref 0.7–4.0)
MCH: 31.9 pg (ref 26.0–34.0)
MCHC: 33.1 g/dL (ref 30.0–36.0)
MCV: 96.5 fL (ref 80.0–100.0)
Monocytes Absolute: 0.3 10*3/uL (ref 0.1–1.0)
Monocytes Relative: 5 %
Neutro Abs: 1.3 10*3/uL — ABNORMAL LOW (ref 1.7–7.7)
Neutrophils Relative %: 23 %
Platelet Count: 133 10*3/uL — ABNORMAL LOW (ref 150–400)
RBC: 3.73 MIL/uL — ABNORMAL LOW (ref 3.87–5.11)
RDW: 13 % (ref 11.5–15.5)
WBC Count: 5.9 10*3/uL (ref 4.0–10.5)
nRBC: 0 % (ref 0.0–0.2)

## 2020-11-08 LAB — SAVE SMEAR(SSMR), FOR PROVIDER SLIDE REVIEW

## 2020-11-08 LAB — URIC ACID: Uric Acid, Serum: 4.2 mg/dL (ref 2.5–7.1)

## 2020-11-08 LAB — PHOSPHORUS: Phosphorus: 3.9 mg/dL (ref 2.5–4.6)

## 2020-11-08 LAB — LACTATE DEHYDROGENASE: LDH: 194 U/L — ABNORMAL HIGH (ref 98–192)

## 2020-11-08 MED FILL — CALQUENCE 100 MG CAPSULE: 100 | 30 days supply | Qty: 60 | Fill #6

## 2020-11-08 NOTE — Progress Notes (Signed)
Hematology and Oncology Follow Up Visit  Sara Sanford [id number] Oct 11, 1956 64 y.o. 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  So far, everything is going quite well with her.  She is done very nicely with the Calquence and venetoclax.  She has had no problems with either.  She has had no problems with palpitations.  She has had no cough or shortness of breath.  There has been no problems with nausea or vomiting.  There has been no diarrhea.  She has had no rashes.  She has had no bleeding.  She is going on venetoclax 200 mg a day.  This really has helped control her white blood cell count nicely.  She had a very nice Thanksgiving with her family.  She did a lot of cooking.  She has had no problems with her skin.  Unfortunately she has had some problems with her knees.  She has some knee issues but she does not feel to be a candidate for surgery.  Medications:  Allergies as of 11/08/2020      Reactions   Propoxyphene Anaphylaxis, Shortness Of Breath, Other (See Comments)   Hallucinations   Nitrofurantoin Diarrhea, Rash   Welts with blisters      Medication List       Accurate as of November 08, 2020  3:37 PM. If you have any questions, ask your nurse or doctor.        STOP taking these medications   ciprofloxacin 250 MG tablet Commonly known as: Cipro Stopped by: Volanda Napoleon, MD   nitrofurantoin (macrocrystal-monohydrate) 100 MG capsule Commonly known as: Macrobid Stopped by: Volanda Napoleon, MD   omeprazole 20 MG capsule Commonly known as: PriLOSEC Stopped by: Volanda Napoleon, MD     TAKE these medications   Calquence 100 MG capsule Generic drug: acalabrutinib Take 1 capsule (100 mg total) by mouth 2 (two) times daily.   clobetasol cream 0.05 % Commonly known as: TEMOVATE APPLY  CREAM TWICE TO THREE TIMES A WEEK AT  NIGHT  AS  DIRECTED   FLUoxetine 20 MG capsule Commonly known as: PROZAC Take 1 capsule by mouth once daily   folic acid 1 MG tablet Commonly known as: FOLVITE Take 2 mg by mouth daily.   venetoclax 100 MG tablet Commonly known as: VENCLEXTA Take 100 mg by mouth daily.   vitamin B-12 1000 MCG tablet Commonly known as: CYANOCOBALAMIN Take 1,000 mcg by mouth daily.       Allergies:  Allergies  Allergen Reactions  . Propoxyphene Anaphylaxis, Shortness Of Breath and Other (See Comments)    Hallucinations   . Nitrofurantoin Diarrhea and Rash    Welts with blisters    Past Medical History, Surgical history, Social history, and Family History were reviewed and updated.  Review of Systems: Review of Systems  Constitutional: Negative.   HENT: Negative.   Eyes: Negative.   Respiratory: Negative.  Cardiovascular: Negative.   Gastrointestinal: Negative.   Genitourinary: Negative.   Musculoskeletal: Negative.   Skin: Negative.   Neurological: Negative.   Endo/Heme/Allergies: Negative.   Psychiatric/Behavioral: Negative.      Physical Exam:  weight is 227 lb (103 kg). Her oral temperature is 98.3 F (36.8 C). Her blood pressure is 105/46 (abnormal) and her pulse is 63. Her respiration is 20 and oxygen saturation is 98%.   Wt Readings from Last 3 Encounters:  11/08/20 227 lb (103 kg)  10/16/20 226 lb (102.5 kg)  10/03/20 226 lb 4 oz (102.6 kg)    Physical Exam Vitals reviewed.  HENT:     Head: Normocephalic and atraumatic.  Eyes:     Pupils: Pupils are equal, round, and reactive to light.  Cardiovascular:     Rate  and Rhythm: Normal rate and regular rhythm.     Heart sounds: Normal heart sounds.  Pulmonary:     Effort: Pulmonary effort is normal.     Breath sounds: Normal breath sounds.  Abdominal:     General: Bowel sounds are normal.     Palpations: Abdomen is soft.  Musculoskeletal:        General: No tenderness or deformity. Normal range of motion.     Cervical back: Normal range of motion.  Lymphadenopathy:     Cervical: No cervical adenopathy.  Skin:    General: Skin is warm and dry.     Findings: No erythema or rash.  Neurological:     Mental Status: She is alert and oriented to person, place, and time.  Psychiatric:        Behavior: Behavior normal.        Thought Content: Thought content normal.        Judgment: Judgment normal.      Lab Results  Component Value Date   WBC 5.9 11/08/2020   HGB 11.9 (L) 11/08/2020   HCT 36.0 11/08/2020   MCV 96.5 11/08/2020   PLT 133 (L) 11/08/2020   Lab Results  Component Value Date   FERRITIN 25 05/15/2020   IRON 89 05/15/2020   TIBC 344 05/15/2020   UIBC 255 05/15/2020   IRONPCTSAT 26 05/15/2020   Lab Results  Component Value Date   RETICCTPCT 2.5 05/15/2020   RBC 3.73 (L) 11/08/2020   No results found for: KPAFRELGTCHN, LAMBDASER, KAPLAMBRATIO No results found for: IGGSERUM, IGA, IGMSERUM No results found for: [name], SPEI   Chemistry      Component Value Date/Time   NA 140 11/08/2020 1433   K 4.2 11/08/2020 1433   CL 106 11/08/2020 1433   CO2 28 11/08/2020 1433   BUN 15 11/08/2020 1433   CREATININE 0.66 11/08/2020 1433   CREATININE 0.69 02/01/2020 1149      Component Value Date/Time   CALCIUM 9.0 11/08/2020 1433   ALKPHOS 68 11/08/2020 1433   AST 15 11/08/2020 1433   ALT 12 11/08/2020 1433   BILITOT 0.5 11/08/2020 1433       Impression and Plan: Sara Sanford is a very pleasant 64 yo caucasian female with CLL.  I think she has relatively decent  prognostic markers.  She has responded quite nicely to the acalabrutinib/venetoclax combination.  Her lymphocytes are going down slowly but surely.  Think that the current protocol is perfect for her.  I would not make any dosage adjustments.  We will now get her back in 2 months.  I feel confident that she will  remain stable.  I am just happy that she has done so well.    Volanda Napoleon, MD 12/1/20213:37 PM

## 2020-11-08 NOTE — Telephone Encounter (Signed)
Appointments scheduled patient has My Chart 12/1 los

## 2020-11-09 LAB — IGG, IGA, IGM
IgA: 24 mg/dL — ABNORMAL LOW (ref 87–352)
IgG (Immunoglobin G), Serum: 421 mg/dL — ABNORMAL LOW (ref 586–1602)
IgM (Immunoglobulin M), Srm: 47 mg/dL (ref 26–217)

## 2020-11-13 ENCOUNTER — Encounter: Payer: Self-pay | Admitting: Family Medicine

## 2020-11-13 ENCOUNTER — Other Ambulatory Visit: Payer: Self-pay

## 2020-11-13 ENCOUNTER — Ambulatory Visit (INDEPENDENT_AMBULATORY_CARE_PROVIDER_SITE_OTHER): Payer: Medicare Other | Admitting: Family Medicine

## 2020-11-13 VITALS — BP 88/49 | HR 49

## 2020-11-13 DIAGNOSIS — N390 Urinary tract infection, site not specified: Secondary | ICD-10-CM | POA: Diagnosis not present

## 2020-11-13 DIAGNOSIS — R319 Hematuria, unspecified: Secondary | ICD-10-CM | POA: Diagnosis not present

## 2020-11-13 DIAGNOSIS — N3000 Acute cystitis without hematuria: Secondary | ICD-10-CM | POA: Diagnosis not present

## 2020-11-13 DIAGNOSIS — R300 Dysuria: Secondary | ICD-10-CM | POA: Diagnosis not present

## 2020-11-13 LAB — POCT URINALYSIS DIP (CLINITEK)
Bilirubin, UA: NEGATIVE
Glucose, UA: NEGATIVE mg/dL
Ketones, POC UA: NEGATIVE mg/dL
Nitrite, UA: POSITIVE — AB
POC PROTEIN,UA: NEGATIVE
Spec Grav, UA: 1.02 (ref 1.010–1.025)
Urobilinogen, UA: 0.2 E.U./dL
pH, UA: 6.5 (ref 5.0–8.0)

## 2020-11-13 MED ORDER — AMOXICILLIN-POT CLAVULANATE 875-125 MG PO TABS: 1.0000 | ORAL_TABLET | Freq: Two times a day (BID) | ORAL | 0 refills | Status: DC

## 2020-11-13 NOTE — Progress Notes (Signed)
Acute Office Visit  Subjective:    Patient ID: Sara Sanford, female    DOB: Nov 26, 1956, 64 y.o.   MRN: [id number]  Chief Complaint  Patient presents with  . Urinary Tract Infection    x1 day    HPI Patient is in today for urinary frequency that started yesterday.  She is some over-the-counter Azo.  Having some mild back pain today.  Afebrile.  UTI was about 2-1/2 to 3 weeks ago.  She was given Keflex last time and unfortunately it caused severe diarrhea.  She was then switched to Macrobid based on the culture results which unfortunately causes itching and a little bit of irritation and skin rash.  But she feels like it did completely resolve her symptoms for at least over a little over a week.  Past Medical History:  Diagnosis Date  . Allergy   . Anxiety   . Arthritis   . Chronic lymphocytic leukemia ([place])   . Gallstones   . GERD (gastroesophageal reflux disease)   . Heart burn    peptic ulcer  . Kidney stones   . Lichen sclerosus   . Obesity   . Post-menopausal   . UTI (urinary tract infection)     Past Surgical History:  Procedure Laterality Date  . CARDIAC CATHETERIZATION  2008  . CHOLECYSTECTOMY    . COLONOSCOPY  2010   [place], [place]  . DILATION AND CURETTAGE OF UTERUS    . ESOPHAGOGASTRODUODENOSCOPY  2005   [place], [place] had egd and colon at the same time   . GALLBLADDER SURGERY  2015  . HYSTEROSCOPY WITH D & C  2008  . [place]  . UPPER GASTROINTESTINAL ENDOSCOPY      Family History  Problem Relation Age of Onset  . Breast cancer Paternal Aunt        pm breast CA  . Breast cancer Paternal Aunt        PM breast CA  . Heart attack Father   . Hypertension Father   . Colon polyps Father   . Hypertension Mother   . Stomach cancer Maternal Grandmother   . Colon cancer Neg Hx   . Rectal cancer Neg Hx     Social History   Socioeconomic History  . Marital status: Married    Spouse name: Not on file  . Number of children: 2   . Years of education: Not on file  . Highest education level: Not on file  Occupational History  . Occupation: Cytogeneticist: [employer]  Tobacco Use  . Smoking status: Never Smoker  . Smokeless tobacco: Never Used  Vaping Use  . Vaping Use: Never used  Substance and Sexual Activity  . Alcohol use: No  . Drug use: Never  . Sexual activity: Yes    Partners: Male    Birth control/protection: Post-menopausal  Other Topics Concern  . Not on file  Social History Narrative   2 mile walk 4 times a week.     Social Determinants of Health   Financial Resource Strain:   . Difficulty of Paying Living Expenses: Not on file  Food Insecurity:   . Worried About Charity fundraiser in the Last Year: Not on file  . Ran Out of Food in the Last Year: Not on file  Transportation Needs:   . Lack of Transportation (Medical): Not on file  . Lack of Transportation (Non-Medical): Not on file  Physical Activity:   .  Days of Exercise per Week: Not on file  . Minutes of Exercise per Session: Not on file  Stress:   . Feeling of Stress : Not on file  Social Connections:   . Frequency of Communication with Friends and Family: Not on file  . Frequency of Social Gatherings with Friends and Family: Not on file  . Attends Religious Services: Not on file  . Active Member of Clubs or Organizations: Not on file  . Attends Archivist Meetings: Not on file  . Marital Status: Not on file  Intimate Partner Violence:   . Fear of Current or Ex-Partner: Not on file  . Emotionally Abused: Not on file  . Physically Abused: Not on file  . Sexually Abused: Not on file    Outpatient Medications Prior to Visit  Medication Sig Dispense Refill  . acalabrutinib (CALQUENCE) 100 MG capsule Take 1 capsule (100 mg total) by mouth 2 (two) times daily. 60 capsule 6  . clobetasol cream (TEMOVATE) 0.05 % APPLY  CREAM TWICE TO THREE TIMES A WEEK AT  NIGHT  AS  DIRECTED 60 g 0  . FLUoxetine (PROZAC) 20 MG  capsule Take 1 capsule by mouth once daily 90 capsule 3  . folic acid (FOLVITE) 1 MG tablet Take 2 mg by mouth daily.    Marland Kitchen venetoclax 100 MG TABS Take 100 mg by mouth daily. 30 tablet 3  . vitamin B-12 (CYANOCOBALAMIN) 1000 MCG tablet Take 1,000 mcg by mouth daily.     No facility-administered medications prior to visit.    Allergies  Allergen Reactions  . Propoxyphene Anaphylaxis, Shortness Of Breath and Other (See Comments)    Hallucinations   . Keflex [Cephalexin] Other (See Comments)    Diarrrhea  . Nitrofurantoin Diarrhea and Rash    Welts with blisters    Review of Systems     Objective:    Physical Exam  BP (!) 88/49   Pulse (!) 49   SpO2 99%  Wt Readings from Last 3 Encounters:  11/08/20 227 lb (103 kg)  10/16/20 226 lb (102.5 kg)  10/03/20 226 lb 4 oz (102.6 kg)    Health Maintenance Due  Topic Date Due  . FOOT EXAM  Never done  . OPHTHALMOLOGY EXAM  Never done  . URINE MICROALBUMIN  Never done  . PNA vac Low Risk Adult (2 of 2 - PPSV23) 09/08/2020    There are no preventive care reminders to display for this patient.   Lab Results  Component Value Date   TSH 2.155 05/15/2020   Lab Results  Component Value Date   WBC 5.9 11/08/2020   HGB 11.9 (L) 11/08/2020   HCT 36.0 11/08/2020   MCV 96.5 11/08/2020   PLT 133 (L) 11/08/2020   Lab Results  Component Value Date   NA 140 11/08/2020   K 4.2 11/08/2020   CO2 28 11/08/2020   GLUCOSE 97 11/08/2020   BUN 15 11/08/2020   CREATININE 0.66 11/08/2020   BILITOT 0.5 11/08/2020   ALKPHOS 68 11/08/2020   AST 15 11/08/2020   ALT 12 11/08/2020   PROT 5.9 (L) 11/08/2020   ALBUMIN 4.0 11/08/2020   CALCIUM 9.0 11/08/2020   ANIONGAP 6 11/08/2020   Lab Results  Component Value Date   CHOL 206 (H) 02/01/2020   Lab Results  Component Value Date   HDL 38 (L) 02/01/2020   Lab Results  Component Value Date   LDLCALC 143 (H) 02/01/2020   Lab Results  Component  Value Date   TRIG 124 02/01/2020    Lab Results  Component Value Date   CHOLHDL 5.4 (H) 02/01/2020   Lab Results  Component Value Date   HGBA1C 5.1 06/01/2020       Assessment & Plan:   Problem List Items Addressed This Visit      Genitourinary   Recurrent UTI    Other Visit Diagnoses    Dysuria    -  Primary   Relevant Orders   POCT URINALYSIS DIP (CLINITEK) (Completed)   Urine Culture   Hematuria, unspecified type       Relevant Orders   Urine Culture   Acute cystitis without hematuria         Urinary tract infection-urinalysis positive for nitrites and small amount of leukocytes and blood.  We will go ahead and treat.  Will send for culture again hopefully no resistance this time.  Will tx with Augmentin.  Unfortunately Bactrim is contraindicated with her chemotherapy drugs.  Recurrent UTI  - we can't use Bactrim bc of her chemo drugs and we can't use macrobid bc of her reaction.  Consider topical estrogen for post menopausal urethral atrophy that could be contributing to her recurrence.    Meds ordered this encounter  Medications  . amoxicillin-clavulanate (AUGMENTIN) 875-125 MG tablet    Sig: Take 1 tablet by mouth 2 (two) times daily.    Dispense:  10 tablet    Refill:  0     Beatrice Lecher, MD

## 2020-11-13 NOTE — Progress Notes (Signed)
sxs x 1 day. Tried AZO

## 2020-11-17 ENCOUNTER — Other Ambulatory Visit: Payer: Self-pay | Admitting: *Deleted

## 2020-11-17 LAB — URINE CULTURE
MICRO NUMBER:: 11289348
SPECIMEN QUALITY:: ADEQUATE

## 2020-11-17 MED ORDER — NITROFURANTOIN MONOHYD MACRO 100 MG PO CAPS: 100.0000 mg | ORAL_CAPSULE | Freq: Two times a day (BID) | ORAL | 0 refills | Status: DC

## 2020-11-17 MED ORDER — CIPROFLOXACIN HCL 250 MG PO TABS: 250.0000 mg | ORAL_TABLET | Freq: Two times a day (BID) | ORAL | 0 refills | Status: DC

## 2020-11-17 NOTE — Addendum Note (Signed)
Addended by: Beatrice Lecher D on: 11/17/2020 11:19 AM   Modules accepted: Orders

## 2020-11-17 NOTE — Addendum Note (Signed)
Addended by: Beatrice Lecher D on: 11/17/2020 12:28 PM   Modules accepted: Orders

## 2020-11-27 MED ORDER — FLUCONAZOLE 150 MG PO TABS: 150.0000 mg | ORAL_TABLET | Freq: Every day | ORAL | 1 refills | Status: DC

## 2020-11-29 ENCOUNTER — Other Ambulatory Visit: Payer: Self-pay | Admitting: Hematology & Oncology

## 2020-12-04 MED FILL — CALQUENCE 100 MG CAPSULE: 100 | 30 days supply | Qty: 60 | Fill #0

## 2020-12-13 MED FILL — VENCLEXTA 100 MG TABS: 100 | 30 days supply | Qty: 60 | Fill #2

## 2020-12-14 ENCOUNTER — Other Ambulatory Visit: Payer: Self-pay | Admitting: *Deleted

## 2020-12-14 MED ORDER — FOLIC ACID 1 MG PO TABS: 2.0000 mg | ORAL_TABLET | Freq: Every day | ORAL | 3 refills | Status: DC

## 2021-01-03 MED FILL — CALQUENCE 100 MG CAPSULE: 100 | 30 days supply | Qty: 60 | Fill #1

## 2021-01-10 ENCOUNTER — Telehealth: Payer: Self-pay | Admitting: *Deleted

## 2021-01-10 ENCOUNTER — Other Ambulatory Visit: Payer: Self-pay

## 2021-01-10 ENCOUNTER — Inpatient Hospital Stay: Payer: Medicare Other | Attending: Family

## 2021-01-10 ENCOUNTER — Inpatient Hospital Stay (HOSPITAL_BASED_OUTPATIENT_CLINIC_OR_DEPARTMENT_OTHER): Payer: Medicare Other | Admitting: Hematology & Oncology

## 2021-01-10 ENCOUNTER — Encounter: Payer: Self-pay | Admitting: Hematology & Oncology

## 2021-01-10 ENCOUNTER — Inpatient Hospital Stay: Payer: Medicare Other

## 2021-01-10 VITALS — BP 104/58 | HR 76 | Temp 98.5°F | Resp 18 | Wt 230.0 lb

## 2021-01-10 DIAGNOSIS — C9110 Chronic lymphocytic leukemia of B-cell type not having achieved remission: Secondary | ICD-10-CM

## 2021-01-10 DIAGNOSIS — R300 Dysuria: Secondary | ICD-10-CM

## 2021-01-10 DIAGNOSIS — Z79899 Other long term (current) drug therapy: Secondary | ICD-10-CM | POA: Diagnosis not present

## 2021-01-10 DIAGNOSIS — R309 Painful micturition, unspecified: Secondary | ICD-10-CM

## 2021-01-10 DIAGNOSIS — Z881 Allergy status to other antibiotic agents status: Secondary | ICD-10-CM | POA: Diagnosis not present

## 2021-01-10 LAB — CBC WITH DIFFERENTIAL (CANCER CENTER ONLY)
Abs Immature Granulocytes: 0.04 10*3/uL (ref 0.00–0.07)
Basophils Absolute: 0 10*3/uL (ref 0.0–0.1)
Basophils Relative: 0 %
Eosinophils Absolute: 0 10*3/uL (ref 0.0–0.5)
Eosinophils Relative: 1 %
HCT: 36.5 % (ref 36.0–46.0)
Hemoglobin: 12.4 g/dL (ref 12.0–15.0)
Immature Granulocytes: 1 %
Lymphocytes Relative: 52 %
Lymphs Abs: 3 10*3/uL (ref 0.7–4.0)
MCH: 32 pg (ref 26.0–34.0)
MCHC: 34 g/dL (ref 30.0–36.0)
MCV: 94.1 fL (ref 80.0–100.0)
Monocytes Absolute: 0.5 10*3/uL (ref 0.1–1.0)
Monocytes Relative: 8 %
Neutro Abs: 2.2 10*3/uL (ref 1.7–7.7)
Neutrophils Relative %: 38 %
Platelet Count: 123 10*3/uL — ABNORMAL LOW (ref 150–400)
RBC: 3.88 MIL/uL (ref 3.87–5.11)
RDW: 13.5 % (ref 11.5–15.5)
WBC Count: 5.8 10*3/uL (ref 4.0–10.5)
nRBC: 0 % (ref 0.0–0.2)

## 2021-01-10 LAB — URINALYSIS, COMPLETE (UACMP) WITH MICROSCOPIC
Bilirubin Urine: NEGATIVE
Glucose, UA: NEGATIVE mg/dL
Hgb urine dipstick: NEGATIVE
Ketones, ur: NEGATIVE mg/dL
Leukocytes,Ua: NEGATIVE
Nitrite: NEGATIVE
Protein, ur: NEGATIVE mg/dL
Specific Gravity, Urine: 1.02 (ref 1.005–1.030)
pH: 6 (ref 5.0–8.0)

## 2021-01-10 LAB — LACTATE DEHYDROGENASE: LDH: 193 U/L — ABNORMAL HIGH (ref 98–192)

## 2021-01-10 LAB — CMP (CANCER CENTER ONLY)
ALT: 13 U/L (ref 0–44)
AST: 17 U/L (ref 15–41)
Albumin: 4 g/dL (ref 3.5–5.0)
Alkaline Phosphatase: 69 U/L (ref 38–126)
Anion gap: 6 (ref 5–15)
BUN: 12 mg/dL (ref 8–23)
CO2: 29 mmol/L (ref 22–32)
Calcium: 9.1 mg/dL (ref 8.9–10.3)
Chloride: 106 mmol/L (ref 98–111)
Creatinine: 0.72 mg/dL (ref 0.44–1.00)
GFR, Estimated: >60 mL/min (ref >60)
Glucose, Bld: 89 mg/dL (ref 70–99)
Potassium: 4.5 mmol/L (ref 3.5–5.1)
Sodium: 141 mmol/L (ref 135–145)
Total Bilirubin: 0.6 mg/dL (ref 0.3–1.2)
Total Protein: 5.7 g/dL — ABNORMAL LOW (ref 6.5–8.1)

## 2021-01-10 LAB — PHOSPHORUS: Phosphorus: 4 mg/dL (ref 2.5–4.6)

## 2021-01-10 LAB — URIC ACID: Uric Acid, Serum: 4.2 mg/dL (ref 2.5–7.1)

## 2021-01-10 LAB — SAVE SMEAR(SSMR), FOR PROVIDER SLIDE REVIEW

## 2021-01-10 NOTE — Progress Notes (Signed)
Hematology and Oncology Follow Up Visit  Sara Sanford [id number] 27-Aug-1956 65 y.o. 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  We last saw her back in December.  She had a nice Christmas.  She is responding nicely to the Calquence/venetoclax combination.  She has a nice decrease in her lymphocyte count.  She does have some problems with a urinary tract infection.  We will check a urinalysis on her today.  She has little bit of dry skin.  She does have some pruritus.  She has had no issues with diarrhea..  She has a lot of knee problems.  Unfortunately, she says that she is too big to have surgery for her knees.  She has had no rashes.  She has had no headache.  Overall, I would say her performance status is ECOG 1.    Medications:  Allergies as of 01/10/2021      Reactions   Propoxyphene Anaphylaxis, Shortness Of Breath, Other (See Comments)   Hallucinations   Keflex [cephalexin] Other (See Comments)   Diarrrhea   Nitrofurantoin Diarrhea, Rash   Welts with blisters      Medication List       Accurate as of January 10, 2021  2:07 PM. If you have any questions, ask your nurse or doctor.        STOP taking these medications   fluconazole 150 MG tablet Commonly known as: Diflucan Stopped by: Volanda Napoleon, MD   nitrofurantoin (macrocrystal-monohydrate) 100 MG capsule Commonly known as: Macrobid Stopped by: Volanda Napoleon, MD     TAKE these medications   Calquence 100 MG  capsule Generic drug: acalabrutinib TAKE 1 CAPSULE (100 MG TOTAL) BY MOUTH 2 (TWO) TIMES DAILY.   clobetasol cream 0.05 % Commonly known as: TEMOVATE APPLY  CREAM TWICE TO THREE TIMES A WEEK AT  NIGHT  AS  DIRECTED   FLUoxetine 20 MG capsule Commonly known as: PROZAC Take 1 capsule by mouth once daily   folic acid 1 MG tablet Commonly known as: FOLVITE Take 2 tablets (2 mg total) by mouth daily.   venetoclax 100 MG tablet Commonly known as: VENCLEXTA Take 100 mg by mouth daily.   vitamin B-12 1000 MCG tablet Commonly known as: CYANOCOBALAMIN Take 1,000 mcg by mouth daily.       Allergies:  Allergies  Allergen Reactions  . Propoxyphene Anaphylaxis, Shortness Of Breath and Other (See Comments)    Hallucinations   . Keflex [Cephalexin] Other (See Comments)    Diarrrhea  . Nitrofurantoin Diarrhea and Rash    Welts with blisters    Past Medical History, Surgical history, Social history, and Family History were reviewed and updated.  Review of Systems: Review of Systems  Constitutional: Negative.   HENT: Negative.   Eyes: Negative.   Respiratory: Negative.   Cardiovascular: Negative.   Gastrointestinal: Negative.   Genitourinary: Negative.   Musculoskeletal: Negative.   Skin:  Negative.   Neurological: Negative.   Endo/Heme/Allergies: Negative.   Psychiatric/Behavioral: Negative.      Physical Exam:  weight is 230 lb (104.3 kg). Her temperature is 98.5 F (36.9 C). Her blood pressure is 104/58 (abnormal) and her pulse is 76. Her respiration is 18 and oxygen saturation is 99%.   Wt Readings from Last 3 Encounters:  01/10/21 230 lb (104.3 kg)  11/08/20 227 lb (103 kg)  10/16/20 226 lb (102.5 kg)    Physical Exam Vitals reviewed.  HENT:     Head: Normocephalic and atraumatic.  Eyes:     Pupils: Pupils are equal, round, and reactive to light.  Cardiovascular:     Rate and Rhythm: Normal rate and regular rhythm.     Heart sounds: Normal heart sounds.   Pulmonary:     Effort: Pulmonary effort is normal.     Breath sounds: Normal breath sounds.  Abdominal:     General: Bowel sounds are normal.     Palpations: Abdomen is soft.  Musculoskeletal:        General: No tenderness or deformity. Normal range of motion.     Cervical back: Normal range of motion.  Lymphadenopathy:     Cervical: No cervical adenopathy.  Skin:    General: Skin is warm and dry.     Findings: No erythema or rash.  Neurological:     Mental Status: She is alert and oriented to person, place, and time.  Psychiatric:        Behavior: Behavior normal.        Thought Content: Thought content normal.        Judgment: Judgment normal.      Lab Results  Component Value Date   WBC 5.8 01/10/2021   HGB 12.4 01/10/2021   HCT 36.5 01/10/2021   MCV 94.1 01/10/2021   PLT 123 (L) 01/10/2021   Lab Results  Component Value Date   FERRITIN 25 05/15/2020   IRON 89 05/15/2020   TIBC 344 05/15/2020   UIBC 255 05/15/2020   IRONPCTSAT 26 05/15/2020   Lab Results  Component Value Date   RETICCTPCT 2.5 05/15/2020   RBC 3.88 01/10/2021   No results found for: KPAFRELGTCHN, LAMBDASER, KAPLAMBRATIO Lab Results  Component Value Date   IGGSERUM 421 (L) 11/08/2020   IGA 24 (L) 11/08/2020   IGMSERUM 47 11/08/2020   No results found for: [name], SPEI   Chemistry      Component Value Date/Time   NA 141 01/10/2021 1328   K 4.5 01/10/2021 1328   CL 106 01/10/2021 1328   CO2 29 01/10/2021 1328   BUN 12 01/10/2021 1328   CREATININE 0.72 01/10/2021 1328   CREATININE 0.69 02/01/2020 1149      Component Value Date/Time   CALCIUM 9.1 01/10/2021 1328   ALKPHOS 69 01/10/2021 1328   AST 17 01/10/2021 1328   ALT 13 01/10/2021 1328   BILITOT 0.6 01/10/2021 1328       Impression and Plan: Sara Sanford is a very pleasant 65 yo caucasian female with CLL.  I think she has relatively decent prognostic markers.  She  has responded quite nicely to the acalabrutinib/venetoclax combination.  Her lymphocytes are going down slowly but surely.  Think that the current protocol is perfect for her.  I would not make any dosage adjustments.  We will now get her back in 3 months.  I feel confident that she will remain stable.  I  just feel bad that her knees are giving her trouble.  It would be nice if she could be operated on.  Volanda Napoleon, MD 2/2/20222:07 PM

## 2021-01-10 NOTE — Telephone Encounter (Signed)
Per los 01/10/21 - scheduled for 04/09/21 - patient to view in [place]

## 2021-01-11 LAB — IGG, IGA, IGM
IgA: 18 mg/dL — ABNORMAL LOW (ref 87–352)
IgG (Immunoglobin G), Serum: 447 mg/dL — ABNORMAL LOW (ref 586–1602)
IgM (Immunoglobulin M), Srm: 38 mg/dL (ref 26–217)

## 2021-01-12 ENCOUNTER — Telehealth: Payer: Self-pay | Admitting: *Deleted

## 2021-01-12 MED ORDER — CIPROFLOXACIN HCL 500 MG PO TABS
500.0000 mg | ORAL_TABLET | Freq: Two times a day (BID) | ORAL | 0 refills | Status: DC
Start: 2021-01-12 — End: 2021-03-01

## 2021-01-12 NOTE — Telephone Encounter (Signed)
-----   Message from Volanda Napoleon, MD sent at 01/12/2021  7:56 AM EST ----- Call -m she does have a E.coli UTI.  Please call in Cipro 500 mg po BID x 5 days.  Thanks!!  Laurey Arrow

## 2021-01-12 NOTE — Telephone Encounter (Signed)
Notified pt of results. Cipro sent to pt preferred pharmacy.

## 2021-01-13 LAB — URINE CULTURE: Culture: >=100000 — AB

## 2021-01-15 ENCOUNTER — Telehealth: Payer: Self-pay | Admitting: *Deleted

## 2021-01-15 ENCOUNTER — Other Ambulatory Visit: Payer: Self-pay | Admitting: Hematology & Oncology

## 2021-01-15 MED ORDER — NITROFURANTOIN MONOHYD MACRO 100 MG PO CAPS: 100.0000 mg | ORAL_CAPSULE | Freq: Two times a day (BID) | ORAL | 0 refills | Status: DC

## 2021-01-15 NOTE — Progress Notes (Unsigned)
macrobid

## 2021-01-15 NOTE — Telephone Encounter (Signed)
Patient called stating that she was advised by pharmacist as well as Dr Charise Carwin not to take Cipro with Venclexta.  Dr Marin Olp notified.  PAtient requesting that she take MAcrobid in spite of being on her allergy list for rash.  She states she can manage the rash but is not comfortable taking Cipro in light of the warning.  Dr Marin Olp sent Rx for MAcrobid to patient pharmacy

## 2021-01-17 ENCOUNTER — Encounter: Payer: Self-pay | Admitting: Hematology & Oncology

## 2021-02-05 MED FILL — VENCLEXTA 100 MG TABS: 100 | 30 days supply | Qty: 60 | Fill #3

## 2021-02-05 MED FILL — CALQUENCE 100 MG CAPSULE: 100 | 30 days supply | Qty: 60 | Fill #2

## 2021-02-11 ENCOUNTER — Other Ambulatory Visit: Payer: Self-pay | Admitting: Hematology & Oncology

## 2021-02-14 ENCOUNTER — Other Ambulatory Visit: Payer: Self-pay | Admitting: Hematology & Oncology

## 2021-03-01 ENCOUNTER — Other Ambulatory Visit: Payer: Self-pay

## 2021-03-01 ENCOUNTER — Encounter: Payer: Self-pay | Admitting: Obstetrics and Gynecology

## 2021-03-01 ENCOUNTER — Ambulatory Visit (INDEPENDENT_AMBULATORY_CARE_PROVIDER_SITE_OTHER): Payer: Medicare Other | Admitting: Obstetrics and Gynecology

## 2021-03-01 VITALS — BP 101/49 | HR 52 | Resp 16 | Ht 62.0 in | Wt 230.0 lb

## 2021-03-01 DIAGNOSIS — D1801 Hemangioma of skin and subcutaneous tissue: Secondary | ICD-10-CM | POA: Diagnosis not present

## 2021-03-01 NOTE — Progress Notes (Signed)
GYNECOLOGY OFFICE NOTE  History:  65 y.o. G4P0010 here today for black spot that husband noted on her "goods". She has not noticed it before but husband noted it and it is new. Has been there since at least christmas. Does not itch/burn. Has not tried anything for it.   Past Medical History:  Diagnosis Date  . Allergy   . Anxiety   . Arthritis   . Chronic lymphocytic leukemia ([place])   . Gallstones   . GERD (gastroesophageal reflux disease)   . Heart burn    peptic ulcer  . Kidney stones   . Lichen sclerosus   . Obesity   . Post-menopausal   . UTI (urinary tract infection)     Past Surgical History:  Procedure Laterality Date  . CARDIAC CATHETERIZATION  2008  . CHOLECYSTECTOMY    . COLONOSCOPY  2010   [place], [place]  . DILATION AND CURETTAGE OF UTERUS    . ESOPHAGOGASTRODUODENOSCOPY  2005   [place], [place] had egd and colon at the same time   . GALLBLADDER SURGERY  2015  . HYSTEROSCOPY WITH D & C  2008  . [place]  . UPPER GASTROINTESTINAL ENDOSCOPY       Current Outpatient Medications:  .  CALQUENCE 100 MG capsule, TAKE 1 CAPSULE (100 MG TOTAL) BY MOUTH 2 (TWO) TIMES DAILY., Disp: 60 capsule, Rfl: 6 .  clobetasol cream (TEMOVATE) 0.05 %, APPLY  CREAM TWICE TO THREE TIMES A WEEK AT  NIGHT  AS  DIRECTED, Disp: 60 g, Rfl: 0 .  FLUoxetine (PROZAC) 20 MG capsule, Take 1 capsule by mouth once daily, Disp: 90 capsule, Rfl: 3 .  folic acid (FOLVITE) 1 MG tablet, TAKE TWO TABLETS BY MOUTH DAILY, Disp: 60 tablet, Rfl: 12 .  venetoclax 100 MG TABS, Take 100 mg by mouth daily., Disp: 30 tablet, Rfl: 3 .  vitamin B-12 (CYANOCOBALAMIN) 1000 MCG tablet, Take 1,000 mcg by mouth daily., Disp: , Rfl:   The following portions of the patient's history were reviewed and updated as appropriate: allergies, current medications, past family history, past medical history, past social history, past surgical history and problem list.   Review of Systems:  Pertinent items  noted in HPI and remainder of comprehensive ROS otherwise negative.   Objective:  Physical Exam BP (!) 101/49   Pulse (!) 52   Resp 16   Ht 5\' 2"  (1.575 m)   Wt 230 lb (104.3 kg)   BMI 42.07 kg/m  CONSTITUTIONAL: Well-developed, well-nourished female in no acute distress.  HENT:  Normocephalic, atraumatic. External right and left ear normal. Oropharynx is clear and moist EYES: Conjunctivae and EOM are normal. Pupils are equal, round, and reactive to light. No scleral icterus.  NECK: Normal range of motion, supple, no masses SKIN: Skin is warm and dry. No rash noted. Not diaphoretic. No erythema. No pallor. NEUROLOGIC: Alert and oriented to person, place, and time. Normal reflexes, muscle tone coordination. No cranial nerve deficit noted. PSYCHIATRIC: Normal mood and affect. Normal behavior. Normal judgment and thought content. CARDIOVASCULAR: Normal heart rate noted RESPIRATORY: Effort normal, no problems with respiration noted ABDOMEN: Soft, no distention noted.   PELVIC: Normal appearing external genitalia; 1 mm hemangioma at right upper labia majora MUSCULOSKELETAL: Normal range of motion. No edema noted.  Exam done with chaperone present.  Labs and Imaging No results found.  Assessment & Plan:  1. 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 Please refer to After Visit Summary for other counseling recommendations.   Return if symptoms worsen or fail to improve.   Feliz Beam, MD, [place] for [facility])

## 2021-03-06 MED FILL — CALQUENCE 100 MG CAPSULE: 100 | 30 days supply | Qty: 60 | Fill #3

## 2021-03-09 ENCOUNTER — Other Ambulatory Visit (HOSPITAL_COMMUNITY): Payer: Self-pay

## 2021-03-15 ENCOUNTER — Other Ambulatory Visit (HOSPITAL_COMMUNITY): Payer: Self-pay

## 2021-04-02 ENCOUNTER — Other Ambulatory Visit (HOSPITAL_COMMUNITY): Payer: Self-pay

## 2021-04-04 ENCOUNTER — Other Ambulatory Visit: Payer: Self-pay | Admitting: Hematology & Oncology

## 2021-04-04 ENCOUNTER — Other Ambulatory Visit (HOSPITAL_COMMUNITY): Payer: Self-pay

## 2021-04-04 MED ORDER — VENETOCLAX 100 MG PO TABS
ORAL_TABLET | ORAL | 3 refills | Status: DC
  Filled 2021-04-11: qty 60, 30d supply, fill #0
  Filled 2021-04-30: qty 60, 30d supply, fill #1
  Filled 2021-05-28: qty 60, 30d supply, fill #2
  Filled 2021-08-03: qty 60, 30d supply, fill #3

## 2021-04-04 MED FILL — Acalabrutinib Cap 100 MG: ORAL | 30 days supply | Qty: 60 | Fill #0 | Status: AC

## 2021-04-09 ENCOUNTER — Inpatient Hospital Stay: Payer: Medicare Other | Attending: Family

## 2021-04-09 ENCOUNTER — Other Ambulatory Visit: Payer: Self-pay

## 2021-04-09 ENCOUNTER — Encounter: Payer: Self-pay | Admitting: Hematology & Oncology

## 2021-04-09 ENCOUNTER — Inpatient Hospital Stay (HOSPITAL_BASED_OUTPATIENT_CLINIC_OR_DEPARTMENT_OTHER): Payer: Medicare Other | Admitting: Hematology & Oncology

## 2021-04-09 ENCOUNTER — Telehealth: Payer: Self-pay

## 2021-04-09 VITALS — BP 106/55 | HR 62 | Temp 98.4°F | Resp 16 | Wt 228.0 lb

## 2021-04-09 DIAGNOSIS — Z79899 Other long term (current) drug therapy: Secondary | ICD-10-CM | POA: Insufficient documentation

## 2021-04-09 DIAGNOSIS — R161 Splenomegaly, not elsewhere classified: Secondary | ICD-10-CM | POA: Diagnosis not present

## 2021-04-09 DIAGNOSIS — C44509 Unspecified malignant neoplasm of skin of other part of trunk: Secondary | ICD-10-CM | POA: Diagnosis not present

## 2021-04-09 DIAGNOSIS — Z881 Allergy status to other antibiotic agents status: Secondary | ICD-10-CM | POA: Insufficient documentation

## 2021-04-09 DIAGNOSIS — C9110 Chronic lymphocytic leukemia of B-cell type not having achieved remission: Secondary | ICD-10-CM

## 2021-04-09 DIAGNOSIS — R591 Generalized enlarged lymph nodes: Secondary | ICD-10-CM | POA: Diagnosis not present

## 2021-04-09 LAB — SAVE SMEAR(SSMR), FOR PROVIDER SLIDE REVIEW

## 2021-04-09 LAB — CBC WITH DIFFERENTIAL (CANCER CENTER ONLY)
Abs Immature Granulocytes: 0.02 10*3/uL (ref 0.00–0.07)
Basophils Absolute: 0 10*3/uL (ref 0.0–0.1)
Basophils Relative: 0 %
Eosinophils Absolute: 0 10*3/uL (ref 0.0–0.5)
Eosinophils Relative: 0 %
HCT: 36.8 % (ref 36.0–46.0)
Hemoglobin: 12.7 g/dL (ref 12.0–15.0)
Immature Granulocytes: 0 %
Lymphocytes Relative: 47 %
Lymphs Abs: 2.7 10*3/uL (ref 0.7–4.0)
MCH: 32.4 pg (ref 26.0–34.0)
MCHC: 34.5 g/dL (ref 30.0–36.0)
MCV: 93.9 fL (ref 80.0–100.0)
Monocytes Absolute: 0.4 10*3/uL (ref 0.1–1.0)
Monocytes Relative: 7 %
Neutro Abs: 2.7 10*3/uL (ref 1.7–7.7)
Neutrophils Relative %: 46 %
Platelet Count: 136 10*3/uL — ABNORMAL LOW (ref 150–400)
RBC: 3.92 MIL/uL (ref 3.87–5.11)
RDW: 12.9 % (ref 11.5–15.5)
WBC Count: 5.8 10*3/uL (ref 4.0–10.5)
nRBC: 0 % (ref 0.0–0.2)

## 2021-04-09 LAB — PHOSPHORUS: Phosphorus: 3.4 mg/dL (ref 2.5–4.6)

## 2021-04-09 LAB — CMP (CANCER CENTER ONLY)
ALT: 13 U/L (ref 0–44)
AST: 16 U/L (ref 15–41)
Albumin: 4.1 g/dL (ref 3.5–5.0)
Alkaline Phosphatase: 68 U/L (ref 38–126)
Anion gap: 4 — ABNORMAL LOW (ref 5–15)
BUN: 15 mg/dL (ref 8–23)
CO2: 29 mmol/L (ref 22–32)
Calcium: 9.3 mg/dL (ref 8.9–10.3)
Chloride: 106 mmol/L (ref 98–111)
Creatinine: 0.73 mg/dL (ref 0.44–1.00)
GFR, Estimated: >60 mL/min (ref >60)
Glucose, Bld: 95 mg/dL (ref 70–99)
Potassium: 4.7 mmol/L (ref 3.5–5.1)
Sodium: 139 mmol/L (ref 135–145)
Total Bilirubin: 0.7 mg/dL (ref 0.3–1.2)
Total Protein: 5.9 g/dL — ABNORMAL LOW (ref 6.5–8.1)

## 2021-04-09 LAB — LACTATE DEHYDROGENASE: LDH: 182 U/L (ref 98–192)

## 2021-04-09 LAB — URIC ACID: Uric Acid, Serum: 4.2 mg/dL (ref 2.5–7.1)

## 2021-04-09 NOTE — Telephone Encounter (Signed)
Called and left a vm with appts per 04/09/21 los  Sara Sanford

## 2021-04-09 NOTE — Progress Notes (Signed)
Hematology and Oncology Follow Up Visit  Sara Sanford [id number] [birth year] 65 y.o. 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 Bal is here today for follow-up.  She has responded quite nicely to the Calquence/venetoclax combination.  Go up on that we are having that she may be developing these skin cancers.  We can see this with the [place].  We also can see it with CLL.  She probably needs to go to dermatology.  She lives in [place] so we will have to try to find a dermatologist in [place] who can try to help [place] out.  Otherwise, she is doing pretty well.  She has had no problems with bowels or bladder.  She has had no bleeding.  She really has not had a CAT scan for about a year.  I really think we have to get a CAT scan when we see her back so we can see how everything looks with her lymphadenopathy and splenomegaly.  She has had no fever.  She has had no cough.  There is been no nausea or vomiting.  Overall, I would have to say that her performance status is ECOG 1.   Medications:  Allergies as of 04/09/2021      Reactions   Propoxyphene Anaphylaxis, Shortness Of Breath, Other (See Comments)   Hallucinations   Keflex [cephalexin] Other (See Comments)   Diarrrhea   Nitrofurantoin Diarrhea, Rash   Welts with blisters      Medication List       Accurate as of Apr 09, 2021  1:44 PM. If you have any questions, ask your nurse or doctor.        Calquence 100 MG capsule Generic drug: acalabrutinib TAKE 1 CAPSULE  (100 MG TOTAL) BY MOUTH 2 (TWO) TIMES DAILY.   clobetasol cream 0.05 % Commonly known as: TEMOVATE APPLY  CREAM TWICE TO THREE TIMES A WEEK AT  NIGHT  AS  DIRECTED   FLUoxetine 20 MG capsule Commonly known as: PROZAC Take 1 capsule by mouth once daily   folic acid 1 MG tablet Commonly known as: FOLVITE TAKE TWO TABLETS BY MOUTH DAILY   venetoclax 100 MG tablet Commonly known as: VENCLEXTA Take 100 mg by mouth daily.   venetoclax 100 MG tablet Commonly known as: VENCLEXTA TAKE 2 TABLETS (200 MG) BY MOUTH DAILY.   vitamin B-12 1000 MCG tablet Commonly known as: CYANOCOBALAMIN Take 1,000 mcg by mouth daily.       Allergies:  Allergies  Allergen Reactions  . Propoxyphene Anaphylaxis, Shortness Of Breath and Other (See Comments)    Hallucinations   . Keflex [Cephalexin] Other (See Comments)    Diarrrhea  . Nitrofurantoin Diarrhea and Rash    Welts with blisters    Past Medical History, Surgical history, Social history, and Family History were reviewed and updated.  Review of Systems: Review of Systems  Constitutional: Negative.   HENT: Negative.   Eyes: Negative.   Respiratory: Negative.  Cardiovascular: Negative.   Gastrointestinal: Negative.   Genitourinary: Negative.   Musculoskeletal: Negative.   Skin: Negative.   Neurological: Negative.   Endo/Heme/Allergies: Negative.   Psychiatric/Behavioral: Negative.      Physical Exam:  weight is 228 lb (103.4 kg). Her oral temperature is 98.4 F (36.9 C). Her blood pressure is 106/55 (abnormal) and her pulse is 62. Her respiration is 16 and oxygen saturation is 99%.   Wt Readings from Last 3 Encounters:  04/09/21 228 lb (103.4 kg)  03/01/21 230 lb (104.3 kg)  01/10/21 230 lb (104.3 kg)    Physical Exam Vitals reviewed.  HENT:     Head: Normocephalic and atraumatic.  Eyes:     Pupils: Pupils are equal, round, and reactive to light.  Cardiovascular:     Rate and Rhythm: Normal rate and regular rhythm.      Heart sounds: Normal heart sounds.  Pulmonary:     Effort: Pulmonary effort is normal.     Breath sounds: Normal breath sounds.  Abdominal:     General: Bowel sounds are normal.     Palpations: Abdomen is soft.  Musculoskeletal:        General: No tenderness or deformity. Normal range of motion.     Cervical back: Normal range of motion.  Lymphadenopathy:     Cervical: No cervical adenopathy.  Skin:    General: Skin is warm and dry.     Findings: No erythema or rash.  Neurological:     Mental Status: She is alert and oriented to person, place, and time.  Psychiatric:        Behavior: Behavior normal.        Thought Content: Thought content normal.        Judgment: Judgment normal.      Lab Results  Component Value Date   WBC 5.8 04/09/2021   HGB 12.7 04/09/2021   HCT 36.8 04/09/2021   MCV 93.9 04/09/2021   PLT 136 (L) 04/09/2021   Lab Results  Component Value Date   FERRITIN 25 05/15/2020   IRON 89 05/15/2020   TIBC 344 05/15/2020   UIBC 255 05/15/2020   IRONPCTSAT 26 05/15/2020   Lab Results  Component Value Date   RETICCTPCT 2.5 05/15/2020   RBC 3.92 04/09/2021   No results found for: KPAFRELGTCHN, LAMBDASER, KAPLAMBRATIO Lab Results  Component Value Date   IGGSERUM 447 (L) 01/10/2021   IGA 18 (L) 01/10/2021   IGMSERUM 38 01/10/2021   No results found for: [name], SPEI   Chemistry      Component Value Date/Time   NA 141 01/10/2021 1328   K 4.5 01/10/2021 1328   CL 106 01/10/2021 1328   CO2 29 01/10/2021 1328   BUN 12 01/10/2021 1328   CREATININE 0.72 01/10/2021 1328   CREATININE 0.69 02/01/2020 1149      Component Value Date/Time   CALCIUM 9.1 01/10/2021 1328   ALKPHOS 69 01/10/2021 1328   AST 17 01/10/2021 1328   ALT 13 01/10/2021 1328   BILITOT 0.6 01/10/2021 1328       Impression and Plan: Ms. Flicker is a very pleasant 65 yo caucasian female with CLL.  I think she has  relatively decent prognostic markers.  She has responded quite nicely to the acalabrutinib/venetoclax combination.  Her lymphocytes are going down slowly but surely.  At some point, were probably going to have to do a bone marrow biopsy on her to see if she  is in remission.  We will have to see if she has minimal residual disease.  The best way to do this is with a bone marrow biopsy.  I would probably would not do a bone marrow on her until September.  We will see about dermatology is seeing her for the likely skin cancer that she has.  Again this might be from the [place].  I am just happy that her blood counts are improving.  Her lymphocytes keep coming down slowly but surely.  I looked at her blood smear.  I do not see anything on her blood smear that looks suspicious for active disease.  I will like to get her back in 3 months.  We will do a CT scan when we see her back.  Volanda Napoleon, MD 5/2/20221:44 PM

## 2021-04-10 LAB — KAPPA/LAMBDA LIGHT CHAINS
Kappa free light chain: 14.6 mg/L (ref 3.3–19.4)
Kappa, lambda light chain ratio: 4.17 — ABNORMAL HIGH (ref 0.26–1.65)
Lambda free light chains: 3.5 mg/L — ABNORMAL LOW (ref 5.7–26.3)

## 2021-04-10 LAB — IGG, IGA, IGM
IgA: 16 mg/dL — ABNORMAL LOW (ref 87–352)
IgG (Immunoglobin G), Serum: 487 mg/dL — ABNORMAL LOW (ref 586–1602)
IgM (Immunoglobulin M), Srm: 28 mg/dL (ref 26–217)

## 2021-04-11 ENCOUNTER — Other Ambulatory Visit (HOSPITAL_COMMUNITY): Payer: Self-pay

## 2021-04-26 ENCOUNTER — Other Ambulatory Visit (HOSPITAL_COMMUNITY): Payer: Self-pay

## 2021-04-27 ENCOUNTER — Other Ambulatory Visit: Payer: Self-pay

## 2021-04-27 ENCOUNTER — Emergency Department (INDEPENDENT_AMBULATORY_CARE_PROVIDER_SITE_OTHER)
Admission: EM | Admit: 2021-04-27 | Discharge: 2021-04-27 | Disposition: A | Discharge status: Home / Self Care | Payer: Medicare Other | Source: Home / Self Care

## 2021-04-27 ENCOUNTER — Encounter: Payer: Self-pay | Admitting: Emergency Medicine

## 2021-04-27 DIAGNOSIS — N390 Urinary tract infection, site not specified: Secondary | ICD-10-CM | POA: Diagnosis not present

## 2021-04-27 DIAGNOSIS — R300 Dysuria: Secondary | ICD-10-CM

## 2021-04-27 LAB — POCT URINALYSIS DIP (MANUAL ENTRY)
Bilirubin, UA: NEGATIVE
Glucose, UA: 100 mg/dL — AB
Ketones, POC UA: NEGATIVE mg/dL
Nitrite, UA: POSITIVE — AB
Spec Grav, UA: 1.015 (ref 1.010–1.025)
Urobilinogen, UA: 1 E.U./dL
pH, UA: 5.5 (ref 5.0–8.0)

## 2021-04-27 MED ORDER — SULFAMETHOXAZOLE-TRIMETHOPRIM 800-160 MG PO TABS: 1.0000 | ORAL_TABLET | Freq: Two times a day (BID) | ORAL | 0 refills | Status: AC

## 2021-04-27 NOTE — ED Triage Notes (Signed)
Patient c/o dysuria, urgency and frequency upon awakening this am.  Patient has taken AZO today for discomfort.  Denies any OTC pain meds

## 2021-04-27 NOTE — ED Provider Notes (Signed)
[name]    CSN: [id number] Arrival date & time: 04/27/21  1402      History   Chief Complaint Chief Complaint  Patient presents with  . Dysuria    HPI Sara Sanford is a 65 y.o. female.   HPI 65 year old female presents with dysuria and urinary urgency this morning.  Reports taking AZO earlier today for discomfort.  Denies fever, denies flank pain.  Past Medical History:  Diagnosis Date  . Allergy   . Anxiety   . Arthritis   . Chronic lymphocytic leukemia ([place])   . Gallstones   . GERD (gastroesophageal reflux disease)   . Heart burn    peptic ulcer  . Kidney stones   . Lichen sclerosus   . Obesity   . Post-menopausal   . UTI (urinary tract infection)     Patient Active Problem List   Diagnosis Date Noted  . Hyperlipidemia 01/20/2020  . Rash and nonspecific skin eruption 01/03/2020  . Primary osteoarthritis of left knee 09/09/2019  . Left leg pain 09/09/2019  . Macrocytic anemia 09/07/2019  . CLL (chronic lymphocytic leukemia) ([place]) 08/12/2019  . Recurrent UTI 06/30/2019  . GAD (generalized anxiety disorder) 02/17/2017  . Cholelithiasis 08/17/2014  . Abdominal aortic aneurysm ([place]) 08/01/2014  . POLYARTHRITIS 06/19/2010  . OBESITY 05/22/2009  . GERD 05/22/2009  . LEG EDEMA, BILATERAL 05/22/2009    Past Surgical History:  Procedure Laterality Date  . CARDIAC CATHETERIZATION  2008  . CHOLECYSTECTOMY    . COLONOSCOPY  2010   [place]  . DILATION AND CURETTAGE OF UTERUS    . ESOPHAGOGASTRODUODENOSCOPY  2005   [place], [place] had egd and colon at the same time   . GALLBLADDER SURGERY  2015  . HYSTEROSCOPY WITH D & C  2008  . [place]  . UPPER GASTROINTESTINAL ENDOSCOPY      OB History    Gravida  4   Para  3   Term      Preterm      AB  1   Living        SAB  1   IAB      Ectopic      Multiple      Live Births               Home Medications    Prior to Admission medications    Medication Sig Start Date End Date Taking? Authorizing Provider  acalabrutinib (CALQUENCE) 100 MG capsule TAKE 1 CAPSULE (100 MG TOTAL) BY MOUTH 2 (TWO) TIMES DAILY. 11/29/20 11/29/21 Yes Ennever, Rudell Cobb, MD  clobetasol cream (TEMOVATE) 0.05 % APPLY  CREAM TWICE TO THREE TIMES A WEEK AT  NIGHT  AS  DIRECTED 07/20/20  Yes Sloan Leiter, MD  FLUoxetine (PROZAC) 20 MG capsule Take 1 capsule by mouth once daily 06/21/20  Yes Metheney, Rene Kocher, MD  folic acid (FOLVITE) 1 MG tablet TAKE TWO TABLETS BY MOUTH DAILY 02/14/21  Yes Ennever, Rudell Cobb, MD  sulfamethoxazole-trimethoprim (BACTRIM DS) 800-160 MG tablet Take 1 tablet by mouth 2 (two) times daily for 7 days. 04/27/21 05/04/21 Yes Eliezer Lofts, FNP  venetoclax (VENCLEXTA) 100 MG tablet TAKE 2 TABLETS (200 MG) BY MOUTH DAILY. 04/04/21 04/04/22 Yes Ennever, Rudell Cobb, MD  venetoclax 100 MG TABS Take 100 mg by mouth daily. 10/03/20  Yes Ennever, Rudell Cobb, MD  vitamin B-12 (CYANOCOBALAMIN) 1000 MCG tablet Take 1,000 mcg by mouth daily.   Yes [provider]    Family History Family History  Problem Relation Age of Onset  . Breast cancer Paternal Aunt        pm breast CA  . Breast cancer Paternal Aunt        PM breast CA  . Heart attack Father   . Hypertension Father   . Colon polyps Father   . Hypertension Mother   . Stomach cancer Maternal Grandmother   . Colon cancer Neg Hx   . Rectal cancer Neg Hx     Social History Social History   Tobacco Use  . Smoking status: Never Smoker  . Smokeless tobacco: Never Used  Vaping Use  . Vaping Use: Never used  Substance Use Topics  . Alcohol use: No  . Drug use: Never     Allergies   Propoxyphene, Keflex [cephalexin], and Nitrofurantoin   Review of Systems Review of Systems  Constitutional: Negative.   HENT: Negative.   Respiratory: Negative.   Cardiovascular: Negative.   Gastrointestinal: Negative.   Genitourinary: Positive for dysuria, frequency and urgency. Negative for  flank pain.  Musculoskeletal: Negative.   Skin: Negative.   Neurological: Negative.      Physical Exam Triage Vital Signs ED Triage Vitals  Enc Vitals Group     BP 04/27/21 1514 109/61     Pulse Rate 04/27/21 1514 60     Resp --      Temp 04/27/21 1514 98.5 F (36.9 C)     Temp Source 04/27/21 1514 Oral     SpO2 04/27/21 1514 98 %     Weight --      Height --      Head Circumference --      Peak Flow --      Pain Score 04/27/21 1518 8     Pain Loc --      Pain Edu? --      Excl. in [place]? --    No data found.  Updated Vital Signs BP 109/61 (BP Location: Right Arm)   Pulse 60   Temp 98.5 F (36.9 C) (Oral)   SpO2 98%      Physical Exam Vitals and nursing note reviewed.  Constitutional:      General: She is not in acute distress.    Appearance: Normal appearance. She is obese. She is not ill-appearing.  HENT:     Head: Normocephalic and atraumatic.     Nose: Nose normal.     Mouth/Throat:     Mouth: Mucous membranes are moist.     Pharynx: Oropharynx is clear.  Eyes:     Extraocular Movements: Extraocular movements intact.     Conjunctiva/sclera: Conjunctivae normal.     Pupils: Pupils are equal, round, and reactive to light.  Cardiovascular:     Rate and Rhythm: Normal rate and regular rhythm.     Pulses: Normal pulses.     Heart sounds: Normal heart sounds.  Pulmonary:     Effort: Pulmonary effort is normal. No respiratory distress.     Breath sounds: Normal breath sounds. No wheezing, rhonchi or rales.  Abdominal:     General: Bowel sounds are normal. There is no distension.     Palpations: Abdomen is soft. There is no mass.     Tenderness: There is no abdominal tenderness. There is no right CVA tenderness, left CVA tenderness, guarding or rebound.  Musculoskeletal:        General: Normal range of motion.     Cervical  back: Normal range of motion and neck supple.  Lymphadenopathy:     Cervical: No cervical adenopathy.  Skin:    General: Skin is warm  and dry.  Neurological:     General: No focal deficit present.     Mental Status: She is alert and oriented to person, place, and time.  Psychiatric:        Mood and Affect: Mood normal.        Behavior: Behavior normal.      UC Treatments / Results  Labs (all labs ordered are listed, but only abnormal results are displayed) Labs Reviewed  POCT URINALYSIS DIP (MANUAL ENTRY) - Abnormal; Notable for the following components:      Result Value   Color, UA orange (*)    Clarity, UA cloudy (*)    Glucose, UA =100 (*)    Blood, UA small (*)    Protein Ur, POC trace (*)    Nitrite, UA Positive (*)    Leukocytes, UA Large (3+) (*)    All other components within normal limits  POCT URINALYSIS DIP (MANUAL ENTRY)    EKG   Radiology No results found.  Procedures Procedures (including critical care time)  Medications Ordered in UC Medications - No data to display  Initial Impression / Assessment and Plan / UC Course  I have reviewed the triage vital signs and the nursing notes.  Pertinent labs & imaging results that were available during my care of the patient were reviewed by me and considered in my medical decision making (see chart for details).     MDM: 1.  Recurrent UTI, 2.  Dysuria.  Urine culture ordered.  Patient discharged home, hemodynamically stable Final Clinical Impressions(s) / UC Diagnoses   Final diagnoses:  Recurrent UTI  Dysuria     Discharge Instructions     Advised/instructed patient to take medication as prescribed with food to completion.  Encouraged patient to increase daily water intake while taking medication.  Advised patient we will follow-up with urine culture results once received.    ED Prescriptions    Medication Sig Dispense Auth. Provider   sulfamethoxazole-trimethoprim (BACTRIM DS) 800-160 MG tablet Take 1 tablet by mouth 2 (two) times daily for 7 days. 14 tablet Eliezer Lofts, FNP     PDMP not reviewed this encounter.    Eliezer Lofts, [place] 04/27/21 1542

## 2021-04-27 NOTE — Discharge Instructions (Addendum)
Advised/instructed patient to take medication as prescribed with food to completion.  Encouraged patient to increase daily water intake while taking medication.  Advised patient we will follow-up with urine culture results once received.

## 2021-04-29 LAB — URINE CULTURE
MICRO NUMBER:: 11918551
SPECIMEN QUALITY:: ADEQUATE

## 2021-04-30 ENCOUNTER — Telehealth: Payer: Self-pay | Admitting: *Deleted

## 2021-04-30 ENCOUNTER — Telehealth: Payer: Self-pay | Admitting: Emergency Medicine

## 2021-04-30 ENCOUNTER — Other Ambulatory Visit (HOSPITAL_COMMUNITY): Payer: Self-pay

## 2021-04-30 NOTE — Telephone Encounter (Signed)
Call back to [facility] regarding antibiotics - RN confirmed that the antibiotic was the appropriate one for her UTI based on her culture results. Jenina has not been taking AZO - RN reinforced w/ Maryclaire that it was okay to take AZO per the directions on the box. Pt also to follow up with her PCP or oncologist to see if there are any preventative measures you could take to prevent frequent UTI's. Pt also given the option to establish care w/ a urologist.

## 2021-04-30 NOTE — Telephone Encounter (Signed)
Pt offered an appt for tomorrow she declined. She stated that she was able to contact the someone at [facility] and they told her that she was on the right course of medication for her UTI.

## 2021-05-02 ENCOUNTER — Other Ambulatory Visit (HOSPITAL_COMMUNITY): Payer: Self-pay

## 2021-05-03 ENCOUNTER — Other Ambulatory Visit (HOSPITAL_COMMUNITY): Payer: Self-pay

## 2021-05-03 MED FILL — Acalabrutinib Cap 100 MG: ORAL | 30 days supply | Qty: 60 | Fill #1 | Status: AC

## 2021-05-08 ENCOUNTER — Other Ambulatory Visit (HOSPITAL_COMMUNITY): Payer: Self-pay

## 2021-05-09 ENCOUNTER — Telehealth: Payer: Self-pay | Admitting: Pharmacy Technician

## 2021-05-09 ENCOUNTER — Other Ambulatory Visit (HOSPITAL_COMMUNITY): Payer: Self-pay

## 2021-05-09 NOTE — Telephone Encounter (Signed)
Oral Oncology Patient Advocate Encounter  Was successful in securing patient a $8,000 grant from [employer] to provide copayment coverage for [employer] and [employer]le[employer].  This will keep the out of pocket expense at $0.     [employer] ID: [id number]  I have spoken with the patient.   The billing information is as follows and has been shared with [facility].    RxBin: [id number] PCN: [id number] Member ID: [id number] Group ID: [id number] Dates of Eligibility: 04/18/21 through 04/17/22  Fund:  Chronic Lymphocytic [place] Patient [place] Phone [phone] Fax [phone] 05/09/2021 12:47 PM

## 2021-05-10 ENCOUNTER — Other Ambulatory Visit (HOSPITAL_COMMUNITY): Payer: Self-pay

## 2021-05-11 ENCOUNTER — Other Ambulatory Visit (HOSPITAL_COMMUNITY): Payer: Self-pay

## 2021-05-15 ENCOUNTER — Other Ambulatory Visit (HOSPITAL_COMMUNITY): Payer: Self-pay

## 2021-05-28 ENCOUNTER — Other Ambulatory Visit (HOSPITAL_COMMUNITY): Payer: Self-pay

## 2021-05-28 MED FILL — Acalabrutinib Cap 100 MG: ORAL | 30 days supply | Qty: 60 | Fill #2 | Status: AC

## 2021-05-30 ENCOUNTER — Other Ambulatory Visit: Payer: Self-pay | Admitting: Family Medicine

## 2021-05-30 DIAGNOSIS — Z1231 Encounter for screening mammogram for malignant neoplasm of breast: Secondary | ICD-10-CM

## 2021-06-04 ENCOUNTER — Other Ambulatory Visit (HOSPITAL_COMMUNITY): Payer: Self-pay

## 2021-06-04 ENCOUNTER — Other Ambulatory Visit: Payer: Self-pay | Admitting: Family Medicine

## 2021-06-06 ENCOUNTER — Telehealth: Payer: Self-pay | Admitting: *Deleted

## 2021-06-06 NOTE — Telephone Encounter (Signed)
Called home number twice, something went wrong with patient's phone. Cell phone voicemail is full, not able to leave a message to call and schedule.

## 2021-06-12 ENCOUNTER — Other Ambulatory Visit (HOSPITAL_COMMUNITY): Payer: Self-pay

## 2021-06-18 ENCOUNTER — Other Ambulatory Visit: Payer: Self-pay | Admitting: *Deleted

## 2021-06-18 DIAGNOSIS — L900 Lichen sclerosus et atrophicus: Secondary | ICD-10-CM

## 2021-06-18 MED ORDER — CLOBETASOL PROPIONATE 0.05 % EX CREA: TOPICAL_CREAM | CUTANEOUS | 2 refills | Status: DC

## 2021-07-04 ENCOUNTER — Other Ambulatory Visit: Payer: Self-pay | Admitting: Hematology & Oncology

## 2021-07-04 ENCOUNTER — Other Ambulatory Visit: Payer: Self-pay

## 2021-07-04 ENCOUNTER — Ambulatory Visit (INDEPENDENT_AMBULATORY_CARE_PROVIDER_SITE_OTHER): Payer: Medicare Other

## 2021-07-04 ENCOUNTER — Other Ambulatory Visit (HOSPITAL_COMMUNITY): Payer: Self-pay

## 2021-07-04 DIAGNOSIS — Z1231 Encounter for screening mammogram for malignant neoplasm of breast: Secondary | ICD-10-CM | POA: Diagnosis not present

## 2021-07-04 MED ORDER — CALQUENCE 100 MG PO CAPS
ORAL_CAPSULE | Freq: Two times a day (BID) | ORAL | 6 refills | Status: DC
  Filled 2021-07-04: qty 60, 30d supply, fill #0
  Filled 2021-08-03: qty 60, 30d supply, fill #1
  Filled 2021-09-04: qty 60, 30d supply, fill #2

## 2021-07-09 ENCOUNTER — Other Ambulatory Visit (HOSPITAL_COMMUNITY): Payer: Self-pay

## 2021-07-11 ENCOUNTER — Other Ambulatory Visit (HOSPITAL_COMMUNITY): Payer: Self-pay

## 2021-07-13 ENCOUNTER — Other Ambulatory Visit (HOSPITAL_COMMUNITY): Payer: Self-pay

## 2021-07-16 ENCOUNTER — Other Ambulatory Visit: Payer: Self-pay

## 2021-07-16 ENCOUNTER — Inpatient Hospital Stay (HOSPITAL_BASED_OUTPATIENT_CLINIC_OR_DEPARTMENT_OTHER): Payer: Medicare Other | Admitting: Hematology & Oncology

## 2021-07-16 ENCOUNTER — Ambulatory Visit (HOSPITAL_BASED_OUTPATIENT_CLINIC_OR_DEPARTMENT_OTHER)
Admission: RE | Admit: 2021-07-16 | Discharge: 2021-07-16 | Disposition: A | Discharge status: Home / Self Care | Payer: Medicare Other | Source: Ambulatory Visit | Attending: Hematology & Oncology | Admitting: Hematology & Oncology

## 2021-07-16 ENCOUNTER — Encounter: Payer: Self-pay | Admitting: Hematology & Oncology

## 2021-07-16 ENCOUNTER — Inpatient Hospital Stay: Payer: Medicare Other | Attending: Family

## 2021-07-16 ENCOUNTER — Encounter (HOSPITAL_BASED_OUTPATIENT_CLINIC_OR_DEPARTMENT_OTHER): Payer: Self-pay

## 2021-07-16 VITALS — BP 99/49 | HR 73 | Temp 98.1°F | Resp 18 | Wt 233.0 lb

## 2021-07-16 DIAGNOSIS — C9110 Chronic lymphocytic leukemia of B-cell type not having achieved remission: Secondary | ICD-10-CM | POA: Insufficient documentation

## 2021-07-16 DIAGNOSIS — Z881 Allergy status to other antibiotic agents status: Secondary | ICD-10-CM | POA: Insufficient documentation

## 2021-07-16 DIAGNOSIS — Z79899 Other long term (current) drug therapy: Secondary | ICD-10-CM | POA: Insufficient documentation

## 2021-07-16 DIAGNOSIS — R1011 Right upper quadrant pain: Secondary | ICD-10-CM | POA: Insufficient documentation

## 2021-07-16 LAB — CBC WITH DIFFERENTIAL (CANCER CENTER ONLY)
Abs Immature Granulocytes: 0.01 10*3/uL (ref 0.00–0.07)
Basophils Absolute: 0 10*3/uL (ref 0.0–0.1)
Basophils Relative: 0 %
Eosinophils Absolute: 0 10*3/uL (ref 0.0–0.5)
Eosinophils Relative: 0 %
HCT: 38.5 % (ref 36.0–46.0)
Hemoglobin: 13.3 g/dL (ref 12.0–15.0)
Immature Granulocytes: 0 %
Lymphocytes Relative: 42 %
Lymphs Abs: 2.2 10*3/uL (ref 0.7–4.0)
MCH: 33.4 pg (ref 26.0–34.0)
MCHC: 34.5 g/dL (ref 30.0–36.0)
MCV: 96.7 fL (ref 80.0–100.0)
Monocytes Absolute: 0.4 10*3/uL (ref 0.1–1.0)
Monocytes Relative: 8 %
Neutro Abs: 2.6 10*3/uL (ref 1.7–7.7)
Neutrophils Relative %: 50 %
Platelet Count: 124 10*3/uL — ABNORMAL LOW (ref 150–400)
RBC: 3.98 MIL/uL (ref 3.87–5.11)
RDW: 12.3 % (ref 11.5–15.5)
WBC Count: 5.3 10*3/uL (ref 4.0–10.5)
nRBC: 0 % (ref 0.0–0.2)

## 2021-07-16 LAB — CMP (CANCER CENTER ONLY)
ALT: 14 U/L (ref 0–44)
AST: 17 U/L (ref 15–41)
Albumin: 4.2 g/dL (ref 3.5–5.0)
Alkaline Phosphatase: 66 U/L (ref 38–126)
Anion gap: 9 (ref 5–15)
BUN: 12 mg/dL (ref 8–23)
CO2: 28 mmol/L (ref 22–32)
Calcium: 9.1 mg/dL (ref 8.9–10.3)
Chloride: 105 mmol/L (ref 98–111)
Creatinine: 0.72 mg/dL (ref 0.44–1.00)
GFR, Estimated: >60 mL/min (ref >60)
Glucose, Bld: 87 mg/dL (ref 70–99)
Potassium: 3.9 mmol/L (ref 3.5–5.1)
Sodium: 142 mmol/L (ref 135–145)
Total Bilirubin: 0.8 mg/dL (ref 0.3–1.2)
Total Protein: 5.9 g/dL — ABNORMAL LOW (ref 6.5–8.1)

## 2021-07-16 LAB — LACTATE DEHYDROGENASE: LDH: 193 U/L — ABNORMAL HIGH (ref 98–192)

## 2021-07-16 LAB — URIC ACID: Uric Acid, Serum: 4.1 mg/dL (ref 2.5–7.1)

## 2021-07-16 LAB — PHOSPHORUS: Phosphorus: 3.9 mg/dL (ref 2.5–4.6)

## 2021-07-16 MED ORDER — IOHEXOL 300 MG/ML  SOLN
100.0000 mL | Freq: Once | INTRAMUSCULAR | Status: AC | PRN
  Administered 2021-07-16: 100 mL via INTRAVENOUS

## 2021-07-16 NOTE — Progress Notes (Signed)
Hematology and Oncology Follow Up Visit  KEYDY FINGAR OX:[id number] [birth year] 65 y.o. 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 is here today for follow-up.  She is doing okay.  She really has had no complaints although there is a lesion on her left forearm that is bothering her.  We will have to see about getting her to a dermatologist.  She lives in [place] so I am sure we can find a dermatologist there.  Otherwise, she is doing nicely.  She has had no problems with nausea or vomiting.  She has had no cough or shortness of breath.  We did go ahead and do a CT scan on her today.  The CT scan did not show any evidence of lymphadenopathy.  She is complaining of some pain in the right upper quadrant of her abdomen.  I looked at the CT scan.  There is nothing there that would cause this to be a problem.  She does not have a gallbladder.  She has had no bleeding.  She has had a little bit of diarrhea.  I am sure this is probably from her oral medications.  She has had no leg swelling.  She has had no rashes.  She has had no headache.  Overall, I would have to say her performance status is probably ECOG 1.     Medications:  Allergies as of 07/16/2021       Reactions   Propoxyphene Anaphylaxis, Shortness Of Breath, Other (See Comments)   Hallucinations   Keflex [cephalexin] Other (See Comments)   Diarrrhea   Nitrofurantoin Diarrhea, Rash   Welts with blisters        Medication List        Accurate as of July 16, 2021 12:28 PM. If you have any questions, ask your nurse or doctor.          Calquence 100 MG capsule Generic drug: acalabrutinib TAKE 1 CAPSULE (100 MG TOTAL) BY MOUTH 2 (TWO) TIMES DAILY.   clobetasol cream 0.05 % Commonly known as: TEMOVATE APPLY  CREAM TWICE TO THREE TIMES A WEEK AT  NIGHT  AS  DIRECTED   FLUoxetine 20 MG capsule Commonly known as: PROZAC TAKE ONE CAPSULE BY MOUTH DAILY   folic acid 1 MG tablet Commonly known as: FOLVITE TAKE TWO TABLETS BY MOUTH DAILY   venetoclax 100 MG tablet Commonly known as: VENCLEXTA Take 100 mg by mouth daily.   Venclexta 100 MG tablet Generic drug: venetoclax TAKE 2 TABLETS (200 MG) BY MOUTH DAILY.   vitamin B-12 1000 MCG tablet Commonly  known as: CYANOCOBALAMIN Take 1,000 mcg by mouth daily.        Allergies:  Allergies  Allergen Reactions   Propoxyphene Anaphylaxis, Shortness Of Breath and Other (See Comments)    Hallucinations    Keflex [Cephalexin] Other (See Comments)    Diarrrhea   Nitrofurantoin Diarrhea and Rash    Welts with blisters    Past Medical History, Surgical history, Social history, and Family History were reviewed and updated.  Review of Systems: Review of Systems  Constitutional: Negative.   HENT: Negative.    Eyes: Negative.   Respiratory: Negative.    Cardiovascular: Negative.   Gastrointestinal: Negative.   Genitourinary: Negative.   Musculoskeletal: Negative.   Skin: Negative.   Neurological: Negative.   Endo/Heme/Allergies: Negative.   Psychiatric/Behavioral: Negative.      Physical Exam:  weight is 233 lb (105.7 kg). Her oral temperature is 98.1 F (36.7 C). Her blood pressure is 99/49 (abnormal) and her pulse is 73. Her respiration is 18 and oxygen saturation is 100%.   Wt Readings from Last 3 Encounters:  07/16/21 233 lb (105.7 kg)  04/09/21 228 lb (103.4 kg)  03/01/21 230 lb (104.3 kg)    Physical Exam Vitals reviewed.  HENT:     Head: Normocephalic and  atraumatic.  Eyes:     Pupils: Pupils are equal, round, and reactive to light.  Cardiovascular:     Rate and Rhythm: Normal rate and regular rhythm.     Heart sounds: Normal heart sounds.  Pulmonary:     Effort: Pulmonary effort is normal.     Breath sounds: Normal breath sounds.  Abdominal:     General: Bowel sounds are normal.     Palpations: Abdomen is soft.  Musculoskeletal:        General: No tenderness or deformity. Normal range of motion.     Cervical back: Normal range of motion.  Lymphadenopathy:     Cervical: No cervical adenopathy.  Skin:    General: Skin is warm and dry.     Findings: No erythema or rash.  Neurological:     Mental Status: She is alert and oriented to person, place, and time.  Psychiatric:        Behavior: Behavior normal.        Thought Content: Thought content normal.        Judgment: Judgment normal.     Lab Results  Component Value Date   WBC 5.3 07/16/2021   HGB 13.3 07/16/2021   HCT 38.5 07/16/2021   MCV 96.7 07/16/2021   PLT 124 (L) 07/16/2021   Lab Results  Component Value Date   FERRITIN 25 05/15/2020   IRON 89 05/15/2020   TIBC 344 05/15/2020   UIBC 255 05/15/2020   IRONPCTSAT 26 05/15/2020   Lab Results  Component Value Date   RETICCTPCT 2.5 05/15/2020   RBC 3.98 07/16/2021   Lab Results  Component Value Date   KPAFRELGTCHN 14.6 04/09/2021   LAMBDASER 3.5 (L) 04/09/2021   KAPLAMBRATIO 4.17 (H) 04/09/2021   Lab Results  Component Value Date   IGGSERUM 487 (L) 04/09/2021   IGA 16 (L) 04/09/2021   IGMSERUM 28 04/09/2021   No results found for: Kathrynn Ducking, MSPIKE, SPEI   Chemistry      Component Value Date/Time   NA 142 07/16/2021 1028   K 3.9 07/16/2021 1028   CL 105 07/16/2021 1028   CO2 28 07/16/2021 1028   BUN 12 07/16/2021 1028  CREATININE 0.72 07/16/2021 1028   CREATININE 0.69 02/01/2020 1149      Component Value Date/Time   CALCIUM 9.1 07/16/2021 1028    ALKPHOS 66 07/16/2021 1028   AST 17 07/16/2021 1028   ALT 14 07/16/2021 1028   BILITOT 0.8 07/16/2021 1028       Impression and Plan: [name] is a very pleasant 65 yo caucasian female with CLL.  I think she has relatively decent prognostic markers.  She has responded quite nicely to the acalabrutinib/venetoclax combination.  Her lymphocytes are going down slowly but surely.  I am glad that the CT scan looked fine.  I feel confident about this.  I think that we probably will have to have her on both medications until September of next year.  At that point, if she does not have any minimal residual disease, then I think we can get her off treatment.  Again we will see about getting her to a dermatologist.  I will see her back in 3 months.     Volanda Napoleon, MD 8/8/202212:28 PM

## 2021-07-17 LAB — IGG, IGA, IGM
IgA: 19 mg/dL — ABNORMAL LOW (ref 87–352)
IgG (Immunoglobin G), Serum: 484 mg/dL — ABNORMAL LOW (ref 586–1602)
IgM (Immunoglobulin M), Srm: 26 mg/dL (ref 26–217)

## 2021-07-18 ENCOUNTER — Other Ambulatory Visit (HOSPITAL_COMMUNITY): Payer: Self-pay

## 2021-07-19 ENCOUNTER — Telehealth: Payer: Self-pay

## 2021-07-19 LAB — SURGICAL PATHOLOGY

## 2021-07-19 NOTE — Telephone Encounter (Signed)
Per OV note from 04/09/21 pt is needing a dermatologist in [place]. Per Dr Marin Olp refer to Dr Loyal Jacobson or Dr Mare Ferrari @ [facility]. Referral information sent to fax # [phone].

## 2021-07-23 ENCOUNTER — Other Ambulatory Visit: Payer: Self-pay

## 2021-07-23 ENCOUNTER — Ambulatory Visit (INDEPENDENT_AMBULATORY_CARE_PROVIDER_SITE_OTHER): Payer: Medicare Other | Admitting: Family Medicine

## 2021-07-23 VITALS — BP 89/55 | HR 68 | Ht 63.0 in | Wt 234.0 lb

## 2021-07-23 DIAGNOSIS — Z0000 Encounter for general adult medical examination without abnormal findings: Secondary | ICD-10-CM | POA: Diagnosis not present

## 2021-07-23 LAB — FLOW CYTOMETRY

## 2021-07-23 NOTE — Progress Notes (Signed)
MEDICARE ANNUAL WELLNESS VISIT  07/23/2021  Subjective:  Sara Sanford is a 65 y.o. female patient of Metheney, Rene Kocher, MD who had a Medicare Annual Wellness Visit today. Sara Sanford is Retired and lives with their spouse. she has 2 children. she reports that she is socially active and does interact with friends/family regularly. she is minimally physically active and enjoys sewing and quilting.  Patient Care Team: Sara Marry, MD as PCP - General (Family Medicine)  Advanced Directives 07/23/2021 07/16/2021 04/09/2021 01/10/2021 11/08/2020 10/03/2020 08/02/2020  Does Patient Have a Medical Advance Directive? No No No No No No No  Would patient like information on creating a medical advance directive? No - Patient declined No - Patient declined No - Patient declined No - Patient declined No - Patient declined No - Patient declined No - Patient declined    Hospital Utilization Over the Past 12 Months: # of hospitalizations or ER visits: 0 # of surgeries: 0  Review of Systems    Patient reports that her overall health is unchanged when compared to last year.  Review of Systems: History obtained from chart review and the patient  All other systems negative.  Pain Assessment Pain : No/denies pain Pain Score: 0-No pain     Current Medications & Allergies (verified) Allergies as of 07/23/2021       Reactions   Propoxyphene Anaphylaxis, Shortness Of Breath, Other (See Comments)   Hallucinations   Keflex [cephalexin] Other (See Comments)   Diarrrhea   Nitrofurantoin Diarrhea, Rash   Welts with blisters        Medication List        Accurate as of July 23, 2021  2:44 PM. If you have any questions, ask your nurse or doctor.          Calquence 100 MG capsule Generic drug: acalabrutinib TAKE 1 CAPSULE (100 MG TOTAL) BY MOUTH 2 (TWO) TIMES DAILY.   clobetasol cream 0.05 % Commonly known as: TEMOVATE APPLY  CREAM TWICE TO THREE TIMES A WEEK AT  NIGHT  AS   DIRECTED   FLUoxetine 20 MG capsule Commonly known as: PROZAC TAKE ONE CAPSULE BY MOUTH DAILY   folic acid 1 MG tablet Commonly known as: FOLVITE TAKE TWO TABLETS BY MOUTH DAILY   venetoclax 100 MG tablet Commonly known as: VENCLEXTA Take 100 mg by mouth daily.   Venclexta 100 MG tablet Generic drug: venetoclax TAKE 2 TABLETS (200 MG) BY MOUTH DAILY.   vitamin B-12 1000 MCG tablet Commonly known as: CYANOCOBALAMIN Take 1,000 mcg by mouth daily.        History (reviewed): Past Medical History:  Diagnosis Date   Allergy    Anxiety    Arthritis    Chronic lymphocytic leukemia ([place])    Gallstones    GERD (gastroesophageal reflux disease)    Heart burn    peptic ulcer   Kidney stones    Lichen sclerosus    Obesity    Post-menopausal    UTI (urinary tract infection)    Past Surgical History:  Procedure Laterality Date   CARDIAC CATHETERIZATION  2008   CHOLECYSTECTOMY     COLONOSCOPY  2010   [place], [place]   DILATION AND CURETTAGE OF UTERUS     ESOPHAGOGASTRODUODENOSCOPY  2005   [place], [place] had egd and colon at the same time    GALLBLADDER SURGERY  2015   HYSTEROSCOPY WITH D & C  2008   [place]   UPPER GASTROINTESTINAL  ENDOSCOPY     Family History  Problem Relation Age of Onset   Breast cancer Paternal Aunt        pm breast CA   Breast cancer Paternal Aunt        PM breast CA   Heart attack Father    Hypertension Father    Colon polyps Father    Hypertension Mother    Stomach cancer Maternal Grandmother    Colon cancer Neg Hx    Rectal cancer Neg Hx    Social History   Socioeconomic History   Marital status: Married    Spouse name: Sara Sanford   Number of children: 2   Years of education: 12th grade   Highest education level: 12th grade  Occupational History   Occupation: Cytogeneticist: Company secretary   Occupation: Retired  Tobacco Use   Smoking status: Never   Smokeless tobacco: Never  Brewing technologist Use: Never used  Substance and Sexual Activity   Alcohol use: No   Drug use: Never   Sexual activity: Yes    Partners: Male    Birth control/protection: Post-menopausal  Other Topics Concern   Not on file  Social History Narrative   Lives with her husband. She has two children. She enjoys sewing and quilting.    Social Determinants of Health   Financial Resource Strain: Low Risk    Difficulty of Paying Living Expenses: Not hard at all  Food Insecurity: No Food Insecurity   Worried About Charity fundraiser in the Last Year: Never true   [place] in the Last Year: Never true  Transportation Needs: No Transportation Needs   Lack of Transportation (Medical): No   Lack of Transportation (Non-Medical): No  Physical Activity: Inactive   Days of Exercise per Week: 0 days   Minutes of Exercise per Session: 0 min  Stress: No Stress Concern Present   Feeling of Stress : Not at all  Social Connections: Moderately Integrated   Frequency of Communication with Friends and Family: More than three times a week   Frequency of Social Gatherings with Friends and Family: Once a week   Attends Religious Services: Never   Marine scientist or Organizations: Yes   Attends Music therapist: More than 4 times per year   Marital Status: Married    Activities of Daily Living In your present state of health, do you have any difficulty performing the following activities: 07/23/2021  Hearing? Y  Comment constant ringing of the ears.  Vision? N  Difficulty concentrating or making decisions? N  Walking or climbing stairs? Y  Comment due to the knee problem  Dressing or bathing? N  Doing errands, shopping? N  Preparing Food and eating ? N  Using the Toilet? N  In the past six months, have you accidently leaked urine? N  Comment She has experienced urgency  Do you have problems with loss of bowel control? N  Managing your Medications? N  Managing your Finances? N   Housekeeping or managing your Housekeeping? N  Some recent data might be hidden    Patient Education/Literacy How often do you need to have someone help you when you read instructions, pamphlets, or other written materials from your doctor or pharmacy?: 1 - Never What is the last grade level you completed in school?: Associates degree  Exercise Current Exercise Habits: The patient does not participate in regular exercise at present, Exercise  limited by: orthopedic condition(s)  Diet Patient reports consuming 2 meals a day and 0 snack(s) a day Patient reports that her primary diet is: Regular Patient reports that she does have regular access to food.   Depression Screen PHQ 2/9 Scores 07/23/2021 01/20/2020 07/19/2019 01/12/2019 06/25/2018 06/16/2017 08/20/2016  PHQ - 2 Score 0 1 0 1 0 0 0  PHQ- 9 Score - - 2 3 - 2 1     Fall Risk Fall Risk  07/23/2021 01/20/2020  Falls in the past year? 1 1  Number falls in past yr: 1 0  Injury with Fall? 0 0  Risk for fall due to : History of fall(s);Impaired balance/gait;Orthopedic patient Impaired balance/gait  Follow up Falls evaluation completed;Education provided;Falls prevention discussed Falls prevention discussed     Objective:   BP (!) 89/55 (BP Location: Left Arm, Patient Position: Sitting, Cuff Size: Large)   Pulse 68   Ht '5\' 3"'$  (1.6 m)   Wt 234 lb (106.1 kg)   SpO2 98%   BMI 41.45 kg/m   Last Weight  Most recent update: 07/23/2021  2:12 PM    Weight  106.1 kg (234 lb)             Body mass index is 41.45 kg/m.  Hearing/Vision  [place] did not have difficulty with hearing/understanding during the face-to-face interview Doramae did not have difficulty with her vision during the face-to-face interview Reports that she has not had a formal eye exam by an eye care professional within the past year Reports that she has not had a formal hearing evaluation within the past year  Cognitive Function: 6CIT Screen 07/23/2021  What Year? 0  points  What month? 0 points  What time? 0 points  Count back from 20 0 points  Months in reverse 0 points  Repeat phrase 0 points  Total Score 0    Normal Cognitive Function Screening: Yes (Normal:0-7, Significant for Dysfunction: >8)  Immunization & Health Maintenance Record Immunization History  Administered Date(s) Administered   Influenza Whole 09/21/2009   Influenza, High Dose Seasonal PF 09/09/2019, 11/04/2020   Influenza,inj,Quad PF,6+ Mos 09/23/2018   Influenza-Unspecified 09/21/2015, 09/17/2016, 09/13/2017, 09/23/2018   Moderna Sars-Covid-2 Vaccination 01/21/2020, 02/19/2020   Pneumococcal Conjugate-13 09/09/2019   Tdap 08/03/2012   Zoster, Live 08/21/2015    Health Maintenance  Topic Date Due   OPHTHALMOLOGY EXAM  07/23/2021 (Originally 08/18/1966)   HEMOGLOBIN A1C  07/24/2021 (Originally 12/01/2020)   COVID-19 Vaccine (3 - Moderna risk series) 08/08/2021 (Originally 03/18/2020)   URINE MICROALBUMIN  08/23/2021 (Originally 08/18/1966)   INFLUENZA VACCINE  09/08/2021 (Originally 07/09/2021)   Zoster Vaccines- Shingrix (1 of 2) 10/23/2021 (Originally 08/18/1975)   FOOT EXAM  07/23/2022 (Originally 08/18/1966)   PNA vac Low Risk Adult (2 of 2 - PPSV23) 07/23/2022 (Originally 09/08/2020)   TETANUS/TDAP  08/03/2022   MAMMOGRAM  07/05/2023   COLONOSCOPY (Pts 45-18yr Insurance coverage will need to be confirmed)  12/27/2029   DEXA SCAN  Completed   Hepatitis C Screening  Completed   HPV VACCINES  Aged Out       Assessment  This is a routine wellness examination for [place]  Health Maintenance: Due or Overdue There are no preventive care reminders to display for this patient.   SJake Bathedoes not need a referral for Community Assistance: Care Management:   no Social Work:    no Prescription Assistance:  no Nutrition/Diabetes Education:  no   Plan:  Personalized Goals  Goals  Addressed               This Visit's Progress     Patient Stated  (pt-stated)        07/23/2021 AWV Goal: Exercise for General Health  Patient will verbalize understanding of the benefits of increased physical activity: Exercising regularly is important. It will improve your overall fitness, flexibility, and endurance. Regular exercise also will improve your overall health. It can help you control your weight, reduce stress, and improve your bone density. Over the next year, patient will increase physical activity as tolerated with a goal of at least 150 minutes of moderate physical activity per week.  You can tell that you are exercising at a moderate intensity if your heart starts beating faster and you start breathing faster but can still hold a conversation. Moderate-intensity exercise ideas include: Walking 1 mile (1.6 km) in about 15 minutes Biking Hiking Golfing Dancing Water aerobics Patient will verbalize understanding of everyday activities that increase physical activity by providing examples like the following: Yard work, such as: Sales promotion account executive Gardening Washing windows or floors Patient will be able to explain general safety guidelines for exercising:  Before you start a new exercise program, talk with your health care provider. Do not exercise so much that you hurt yourself, feel dizzy, or get very short of breath. Wear comfortable clothes and wear shoes with good support. Drink plenty of water while you exercise to prevent dehydration or heat stroke. Work out until your breathing and your heartbeat get faster.        Personalized Health Maintenance & Screening Recommendations  Pneumococcal vaccine  Influenza vaccine Shingles vaccine  Lung Cancer Screening Recommended: no (Low Dose CT Chest recommended if Age 61-80 years, 30 pack-year currently smoking OR have quit w/in past 15 years) Hepatitis C Screening recommended: no HIV Screening  recommended: no  Advanced Directives: Written information was not given per the patient's request.  Referrals & Orders No orders of the defined types were placed in this encounter.   Follow-up Plan Follow-up with Sara Marry, MD as planned Discuss shingles vaccine and pneumonia vaccine with Dr. Marin Olp.  Medicare wellness visit in one year.  AVS printed and given to the patient.   I have personally reviewed and noted the following in the patient's chart:   Medical and social history Use of alcohol, tobacco or illicit drugs  Current medications and supplements Functional ability and status Nutritional status Physical activity Advanced directives List of other physicians Hospitalizations, surgeries, and ER visits in previous 12 months Vitals Screenings to include cognitive, depression, and falls Referrals and appointments  In addition, I have reviewed and discussed with patient certain preventive protocols, quality metrics, and best practice recommendations. A written personalized care plan for preventive services as well as general preventive health recommendations were provided to patient.     Tinnie Gens, RN  07/23/2021

## 2021-07-23 NOTE — Patient Instructions (Addendum)
[place] Maintenance Summary and Written Plan of Care  [name] ,  Thank you for allowing me to perform your Medicare Annual Wellness Visit and for your ongoing commitment to your health.   Health Maintenance & Immunization History Health Maintenance  Topic Date Due   OPHTHALMOLOGY EXAM  07/23/2021 (Originally 08/18/1966)   HEMOGLOBIN A1C  07/24/2021 (Originally 12/01/2020)   COVID-19 Vaccine (3 - Moderna risk series) 08/08/2021 (Originally 03/18/2020)   URINE MICROALBUMIN  08/23/2021 (Originally 08/18/1966)   INFLUENZA VACCINE  09/08/2021 (Originally 07/09/2021)   Zoster Vaccines- Shingrix (1 of 2) 10/23/2021 (Originally 08/18/1975)   FOOT EXAM  07/23/2022 (Originally 08/18/1966)   PNA vac Low Risk Adult (2 of 2 - PPSV23) 07/23/2022 (Originally 09/08/2020)   TETANUS/TDAP  08/03/2022   MAMMOGRAM  07/05/2023   COLONOSCOPY (Pts 45-54yr Insurance coverage will need to be confirmed)  12/27/2029   DEXA SCAN  Completed   Hepatitis C Screening  Completed   HPV VACCINES  Aged Out   Immunization History  Administered Date(s) Administered   Influenza Whole 09/21/2009   Influenza, High Dose Seasonal PF 09/09/2019, 11/04/2020   Influenza,inj,Quad PF,6+ Mos 09/23/2018   Influenza-Unspecified 09/21/2015, 09/17/2016, 09/13/2017, 09/23/2018   Moderna Sars-Covid-2 Vaccination 01/21/2020, 02/19/2020   Pneumococcal Conjugate-13 09/09/2019   Tdap 08/03/2012   Zoster, Live 08/21/2015    These are the patient goals that we discussed:  Goals Addressed               This Visit's Progress     Patient Stated (pt-stated)        07/23/2021 AWV Goal: Exercise for General Health  Patient will verbalize understanding of the benefits of increased physical activity: Exercising regularly is important. It will improve your overall fitness, flexibility, and endurance. Regular exercise also will improve your overall health. It can help you control your weight, reduce stress,  and improve your bone density. Over the next year, patient will increase physical activity as tolerated with a goal of at least 150 minutes of moderate physical activity per week.  You can tell that you are exercising at a moderate intensity if your heart starts beating faster and you start breathing faster but can still hold a conversation. Moderate-intensity exercise ideas include: Walking 1 mile (1.6 km) in about 15 minutes Biking Hiking Golfing Dancing Water aerobics Patient will verbalize understanding of everyday activities that increase physical activity by providing examples like the following: Yard work, such as: PSales promotion account executiveGardening Washing windows or floors Patient will be able to explain general safety guidelines for exercising:  Before you start a new exercise program, talk with your health care provider. Do not exercise so much that you hurt yourself, feel dizzy, or get very short of breath. Wear comfortable clothes and wear shoes with good support. Drink plenty of water while you exercise to prevent dehydration or heat stroke. Work out until your breathing and your heartbeat get faster.          This is a list of Health Maintenance Items that are overdue or due now: Pneumococcal vaccine  Influenza vaccine Shingles vaccine    Orders/Referrals Placed Today: No orders of the defined types were placed in this encounter.  (Contact our referral department at [phone] you have not spoken with someone about your referral appointment within the next 5 days)    Follow-up Plan Follow-up with MHali Marry MD as planned  Discuss shingles vaccine and pneumonia vaccine with Dr. Marin Olp.  Medicare wellness visit in one year.  AVS printed and given to the patient.

## 2021-08-03 ENCOUNTER — Other Ambulatory Visit (HOSPITAL_COMMUNITY): Payer: Self-pay

## 2021-08-08 ENCOUNTER — Other Ambulatory Visit (HOSPITAL_COMMUNITY): Payer: Self-pay

## 2021-08-09 ENCOUNTER — Other Ambulatory Visit (HOSPITAL_COMMUNITY): Payer: Self-pay

## 2021-08-23 ENCOUNTER — Ambulatory Visit (INDEPENDENT_AMBULATORY_CARE_PROVIDER_SITE_OTHER): Payer: Medicare Other | Admitting: Family Medicine

## 2021-08-23 ENCOUNTER — Encounter: Payer: Self-pay | Admitting: Family Medicine

## 2021-08-23 VITALS — BP 107/35 | HR 83 | Ht 63.0 in | Wt 237.0 lb

## 2021-08-23 DIAGNOSIS — R0789 Other chest pain: Secondary | ICD-10-CM

## 2021-08-23 DIAGNOSIS — I714 Abdominal aortic aneurysm, without rupture, unspecified: Secondary | ICD-10-CM

## 2021-08-23 DIAGNOSIS — I700 Atherosclerosis of aorta: Secondary | ICD-10-CM

## 2021-08-23 DIAGNOSIS — I491 Atrial premature depolarization: Secondary | ICD-10-CM

## 2021-08-23 DIAGNOSIS — R21 Rash and other nonspecific skin eruption: Secondary | ICD-10-CM | POA: Diagnosis not present

## 2021-08-23 HISTORY — DX: Atherosclerosis of aorta: I70.0

## 2021-08-23 MED ORDER — ROSUVASTATIN CALCIUM 5 MG PO TABS
5.0000 mg | ORAL_TABLET | Freq: Every day | ORAL | 3 refills | Status: DC
Start: 2021-08-23 — End: 2022-02-27

## 2021-08-23 MED ORDER — TRIAMCINOLONE ACETONIDE 0.1 % EX CREA: 1.0000 "application " | TOPICAL_CREAM | Freq: Every day | CUTANEOUS | 0 refills | Status: DC

## 2021-08-23 NOTE — Progress Notes (Addendum)
Established Patient Office Visit  Subjective:  Patient ID: Sara Sanford Sara Sanford Sanford, female    DOB: 1956-01-05  Age: 65 y.o. MRN: [id number]  CC:  Chief Complaint  Patient presents with   Follow-up    HPI Sara Sanford Sara Sanford Sanford presents for   She did want to discuss recent findings on her CT chest abdomen pelvis performed by Dr. Marin Olp on August 8.  They did note some aortic atherosclerosis no for definitive coronary artery calcifications which was good.  Chest pain on the left side of her chest on and off for about a month.  She says it usually occurs on the left side and feels like a squeezing pain.  She says it usually last for a few hours and then eased off.  She has not noted any specific triggers such as motion or activity etc.  She says often times will radiate down into her left arm as well she does have a history of GERD but had to come off of her PPI about a year ago because of her chemotherapy drugs she was told that they did not mix well and so not to take them.  She does have occasional heartburn but says this feels a little bit different.  But she still wonders if it could be connected.  She also has a rash on her upper chest that is been there for quite some time she says it is extremely itchy at times she has a little bit on her arm as well.  It is mostly on the right upper chest.  Past Medical History:  Diagnosis Date   Allergy    Anxiety    Arthritis    Chronic lymphocytic leukemia ([place])    Gallstones    GERD (gastroesophageal reflux disease)    Heart burn    peptic ulcer   Kidney stones    Lichen sclerosus    Obesity    Post-menopausal    UTI (urinary tract infection)     Past Surgical History:  Procedure Laterality Date   CARDIAC CATHETERIZATION  2008   CHOLECYSTECTOMY     COLONOSCOPY  2010   [place], [place]   DILATION AND CURETTAGE OF UTERUS     ESOPHAGOGASTRODUODENOSCOPY  2005   [place], [place] had egd and colon at the same time    GALLBLADDER SURGERY  2015    HYSTEROSCOPY WITH D & C  2008   KIDNEY STONE SURGERY  1977   UPPER GASTROINTESTINAL ENDOSCOPY      Family History  Problem Relation Age of Onset   Breast cancer Paternal Aunt        pm breast CA   Breast cancer Paternal Aunt        PM breast CA   Heart attack Father    Hypertension Father    Colon polyps Father    Hypertension Mother    Stomach cancer Maternal Grandmother    Colon cancer Neg Hx    Rectal cancer Neg Hx     Social History   Socioeconomic History   Marital status: Married    Spouse name: Priscilla Soifer   Number of children: 2   Years of education: 12th grade   Highest education level: 12th grade  Occupational History   Occupation: Cytogeneticist: Company secretary   Occupation: Retired  Tobacco Use   Smoking status: Never   Smokeless tobacco: Never  Vaping Use   Vaping Use: Never used  Substance and Sexual Activity   Alcohol use:  No   Drug use: Never   Sexual activity: Yes    Partners: Male    Birth control/protection: Post-menopausal  Other Topics Concern   Not on file  Social History Narrative   Lives with her husband. She has two children. She enjoys sewing and quilting.    Social Determinants of Health   Financial Resource Strain: Low Risk    Difficulty of Paying Living Expenses: Not hard at all  Food Insecurity: No Food Insecurity   Worried About Charity fundraiser in the Last Year: Never true   [place] in the Last Year: Never true  Transportation Needs: No Transportation Needs   Lack of Transportation (Medical): No   Lack of Transportation (Non-Medical): No  Physical Activity: Inactive   Days of Exercise per Week: 0 days   Minutes of Exercise per Session: 0 min  Stress: No Stress Concern Present   Feeling of Stress : Not at all  Social Connections: Moderately Integrated   Frequency of Communication with Friends and Family: More than three times a week   Frequency of Social Gatherings with Friends and Family: Once a week    Attends Religious Services: Never   Marine scientist or Organizations: Yes   Attends Music therapist: More than 4 times per year   Marital Status: Married  Human resources officer Violence: Not At Risk   Fear of Current or Ex-Partner: No   Emotionally Abused: No   Physically Abused: No   Sexually Abused: No    Outpatient Medications Prior to Visit  Medication Sig Dispense Refill   acalabrutinib (CALQUENCE) 100 MG capsule TAKE 1 CAPSULE (100 MG TOTAL) BY MOUTH 2 (TWO) TIMES DAILY. 60 capsule 6   clobetasol cream (TEMOVATE) 0.05 % APPLY  CREAM TWICE TO THREE TIMES A WEEK AT  NIGHT  AS  DIRECTED 60 g 2   FLUoxetine (PROZAC) 20 MG capsule TAKE ONE CAPSULE BY MOUTH DAILY 90 capsule 1   folic acid (FOLVITE) 1 MG tablet TAKE TWO TABLETS BY MOUTH DAILY 60 tablet 12   venetoclax (VENCLEXTA) 100 MG tablet TAKE 2 TABLETS (200 MG) BY MOUTH DAILY. 60 tablet 3   vitamin B-12 (CYANOCOBALAMIN) 1000 MCG tablet Take 1,000 mcg by mouth daily.     venetoclax 100 MG TABS Take 100 mg by mouth daily. 30 tablet 3   No facility-administered medications prior to visit.    Allergies  Allergen Reactions   Propoxyphene Anaphylaxis, Shortness Of Breath and Other (See Comments)    Hallucinations    Keflex [Cephalexin] Other (See Comments)    Diarrrhea   Nitrofurantoin Diarrhea and Rash    Welts with blisters    ROS Review of Systems    Objective:    Physical Exam Constitutional:      Appearance: Normal appearance. She is well-developed.  HENT:     Head: Normocephalic and atraumatic.  Eyes:     Conjunctiva/sclera: Conjunctivae normal.  Cardiovascular:     Rate and Rhythm: Normal rate. Rhythm irregular.     Heart sounds: Normal heart sounds.  Pulmonary:     Effort: Pulmonary effort is normal.     Breath sounds: Normal breath sounds.  Skin:    General: Skin is warm and dry.  Neurological:     Mental Status: She is alert and oriented to person, place, and time.  Psychiatric:         Behavior: Behavior normal.    BP (!) 107/35   Pulse 83  Ht '5\' 3"'$  (1.6 m)   Wt 237 lb (107.5 kg)   SpO2 94%   BMI 41.98 kg/m  Wt Readings from Last 3 Encounters:  08/23/21 237 lb (107.5 kg)  07/23/21 234 lb (106.1 kg)  07/16/21 233 lb (105.7 kg)     Health Maintenance Due  Topic Date Due   OPHTHALMOLOGY EXAM  Never done   URINE MICROALBUMIN  Never done   COVID-19 Vaccine (3 - Moderna risk series) 03/18/2020   HEMOGLOBIN A1C  12/01/2020    There are no preventive care reminders to display for this patient.  Lab Results  Component Value Date   TSH 2.60 08/23/2021   Lab Results  Component Value Date   WBC 5.3 07/16/2021   HGB 13.3 07/16/2021   HCT 38.5 07/16/2021   MCV 96.7 07/16/2021   PLT 124 (L) 07/16/2021   Lab Results  Component Value Date   NA 142 07/16/2021   K 3.9 07/16/2021   CO2 28 07/16/2021   GLUCOSE 87 07/16/2021   BUN 12 07/16/2021   CREATININE 0.72 07/16/2021   BILITOT 0.8 07/16/2021   ALKPHOS 66 07/16/2021   AST 17 07/16/2021   ALT 14 07/16/2021   PROT 5.9 (L) 07/16/2021   ALBUMIN 4.2 07/16/2021   CALCIUM 9.1 07/16/2021   ANIONGAP 9 07/16/2021   Lab Results  Component Value Date   CHOL 204 (H) 08/23/2021   Lab Results  Component Value Date   HDL 56 08/23/2021   Lab Results  Component Value Date   LDLCALC 125 (H) 08/23/2021   Lab Results  Component Value Date   TRIG 121 08/23/2021   Lab Results  Component Value Date   CHOLHDL 3.6 08/23/2021   Lab Results  Component Value Date   HGBA1C 5.1 06/01/2020      Assessment & Plan:   Problem List Items Addressed This Visit       Cardiovascular and Mediastinum   Aortic atherosclerosis ([place]) - Primary    Discussed starting statin.  She is agreeable. Check for interaction with her chemo drugs.        Relevant Medications   rosuvastatin (CRESTOR) 5 MG tablet   Abdominal aortic aneurysm ([place])    Due for 3-year repeat for follow-up.  3.2 cm on ultrasound in August 2015  and then no change in 2019.      Relevant Medications   rosuvastatin (CRESTOR) 5 MG tablet   Other Relevant Orders   US AORTA   Other Visit Diagnoses     Rash       Relevant Medications   triamcinolone cream (KENALOG) 0.1 %   Atypical chest pain       Relevant Orders   Lipid Panel w/reflex Direct LDL (Completed)   TSH (Completed)   CK (Creatine Kinase) (Completed)   Ambulatory referral to Cardiology   PAC (premature atrial contraction)       Relevant Medications   rosuvastatin (CRESTOR) 5 MG tablet   Other Relevant Orders   Ambulatory referral to Cardiology       Atypical chest pain-I am concerned about her chest pain that she has been having on and off for about a month.  Risk factors include age, aortic atherosclerosis, hyperlipidemia, BMI greater than 40 , will get updated labs today.  EKG shows rate of 60 bpm with scattered PACs. Recommend a stress test she is not sure she would be able to do the treadmill because of her knee so we will order a cardiology consultation  so that she can have a nuclear stress test if needed.  Rash - papular dermatitis, localized.  Will tx with topical steroid for the next 2 to 3 weeks.  If not improving then please let me know.  Meds ordered this encounter  Medications   rosuvastatin (CRESTOR) 5 MG tablet    Sig: Take 1 tablet (5 mg total) by mouth at bedtime.    Dispense:  90 tablet    Refill:  3   triamcinolone cream (KENALOG) 0.1 %    Sig: Apply 1 application topically daily. Rash on chest and arm    Dispense:  45 g    Refill:  0     Follow-up: No follow-ups on file.   I spent 42 minutes on the day of the encounter to include pre-visit record review, face-to-face time with the patient and post visit ordering of test.   Beatrice Lecher, MD

## 2021-08-23 NOTE — Assessment & Plan Note (Signed)
Discussed starting statin.  She is agreeable. Check for interaction with her chemo drugs.

## 2021-08-24 LAB — LIPID PANEL W/REFLEX DIRECT LDL
Cholesterol: 204 mg/dL — ABNORMAL HIGH (ref <200)
HDL: 56 mg/dL (ref >=50)
LDL Cholesterol (Calc): 125 mg/dL (calc) — ABNORMAL HIGH
Non-HDL Cholesterol (Calc): 148 mg/dL (calc) — ABNORMAL HIGH (ref <130)
Total CHOL/HDL Ratio: 3.6 (calc) (ref <5.0)
Triglycerides: 121 mg/dL (ref <150)

## 2021-08-24 LAB — CK: Total CK: 51 U/L (ref 29–143)

## 2021-08-24 LAB — TSH: TSH: 2.6 mIU/L (ref 0.40–4.50)

## 2021-08-24 NOTE — Assessment & Plan Note (Signed)
Due for 3-year repeat for follow-up.  3.2 cm on ultrasound in August 2015 and then no change in 2019.

## 2021-08-24 NOTE — Addendum Note (Signed)
Addended by: Beatrice Lecher D on: 08/24/2021 11:27 AM   Modules accepted: Orders, Level of Service

## 2021-08-24 NOTE — Progress Notes (Signed)
Hi Danee, LDL cholesterol looks better this year, great work and bringing that down its the best is looked in several years.  Your good cholesterol also looks much better its up to 56.  Thyroid looks great.  Muscle enzyme level is also normal.  We have placed a referral to cardiology so let us know if you do not hear from them sometime next week.

## 2021-08-28 ENCOUNTER — Ambulatory Visit (HOSPITAL_BASED_OUTPATIENT_CLINIC_OR_DEPARTMENT_OTHER)
Admission: RE | Admit: 2021-08-28 | Discharge: 2021-08-28 | Disposition: A | Discharge status: Home / Self Care | Payer: Medicare Other | Source: Ambulatory Visit | Attending: Family Medicine | Admitting: Family Medicine

## 2021-08-28 ENCOUNTER — Other Ambulatory Visit: Payer: Self-pay

## 2021-08-28 DIAGNOSIS — I714 Abdominal aortic aneurysm, without rupture, unspecified: Secondary | ICD-10-CM

## 2021-08-29 NOTE — Progress Notes (Signed)
Great news!!! No aneurysm. No evidence of abdominal aortic aneurysm or significant aortic atherosclerosis. Measurements by prior abdominal ultrasound were likely overestimated.

## 2021-09-04 ENCOUNTER — Other Ambulatory Visit: Payer: Self-pay | Admitting: Hematology & Oncology

## 2021-09-04 ENCOUNTER — Other Ambulatory Visit (HOSPITAL_COMMUNITY): Payer: Self-pay

## 2021-09-04 MED ORDER — VENETOCLAX 100 MG PO TABS
ORAL_TABLET | ORAL | 3 refills | Status: DC
Start: 2021-09-04 — End: 2022-01-28
  Filled 2021-09-04: qty 60, 30d supply, fill #0
  Filled 2021-10-02: qty 60, 30d supply, fill #1
  Filled 2021-12-10: qty 60, 30d supply, fill #2
  Filled 2022-01-02: qty 60, 30d supply, fill #3

## 2021-09-05 ENCOUNTER — Other Ambulatory Visit (HOSPITAL_COMMUNITY): Payer: Self-pay

## 2021-09-06 ENCOUNTER — Other Ambulatory Visit (HOSPITAL_COMMUNITY): Payer: Self-pay

## 2021-09-11 ENCOUNTER — Other Ambulatory Visit (HOSPITAL_COMMUNITY): Payer: Self-pay

## 2021-09-12 ENCOUNTER — Other Ambulatory Visit (HOSPITAL_COMMUNITY): Payer: Self-pay

## 2021-09-12 ENCOUNTER — Other Ambulatory Visit: Payer: Self-pay | Admitting: Pharmacist

## 2021-09-12 DIAGNOSIS — C9110 Chronic lymphocytic leukemia of B-cell type not having achieved remission: Secondary | ICD-10-CM

## 2021-09-12 MED ORDER — CALQUENCE 100 MG PO TABS
100.0000 mg | ORAL_TABLET | Freq: Two times a day (BID) | ORAL | 3 refills | Status: DC
  Filled 2021-09-12: qty 60, fill #0
  Filled 2021-10-02: qty 60, 30d supply, fill #0
  Filled 2021-11-07: qty 60, 30d supply, fill #1
  Filled 2021-12-10: qty 60, 30d supply, fill #2
  Filled 2022-01-02: qty 60, 30d supply, fill #3

## 2021-09-14 NOTE — Addendum Note (Signed)
Addended by: Narda Rutherford on: 09/14/2021 07:57 AM   Modules accepted: Orders

## 2021-09-26 ENCOUNTER — Other Ambulatory Visit: Payer: Self-pay

## 2021-09-26 ENCOUNTER — Other Ambulatory Visit (HOSPITAL_COMMUNITY): Payer: Self-pay

## 2021-09-26 DIAGNOSIS — F419 Anxiety disorder, unspecified: Secondary | ICD-10-CM | POA: Insufficient documentation

## 2021-09-26 DIAGNOSIS — T7840XA Allergy, unspecified, initial encounter: Secondary | ICD-10-CM | POA: Insufficient documentation

## 2021-09-26 DIAGNOSIS — N200 Calculus of kidney: Secondary | ICD-10-CM | POA: Insufficient documentation

## 2021-09-26 DIAGNOSIS — Z780 Asymptomatic menopausal state: Secondary | ICD-10-CM | POA: Insufficient documentation

## 2021-09-26 DIAGNOSIS — M1990 Unspecified osteoarthritis, unspecified site: Secondary | ICD-10-CM | POA: Insufficient documentation

## 2021-09-26 DIAGNOSIS — R12 Heartburn: Secondary | ICD-10-CM | POA: Insufficient documentation

## 2021-09-26 DIAGNOSIS — L900 Lichen sclerosus et atrophicus: Secondary | ICD-10-CM | POA: Insufficient documentation

## 2021-10-02 ENCOUNTER — Other Ambulatory Visit (HOSPITAL_COMMUNITY): Payer: Self-pay

## 2021-10-08 ENCOUNTER — Other Ambulatory Visit (HOSPITAL_COMMUNITY): Payer: Self-pay

## 2021-10-11 ENCOUNTER — Encounter: Payer: Self-pay | Admitting: Cardiology

## 2021-10-11 ENCOUNTER — Other Ambulatory Visit: Payer: Self-pay

## 2021-10-11 ENCOUNTER — Ambulatory Visit (INDEPENDENT_AMBULATORY_CARE_PROVIDER_SITE_OTHER): Payer: Medicare Other | Admitting: Cardiology

## 2021-10-11 VITALS — BP 98/60 | HR 76 | Ht 64.0 in | Wt 238.0 lb

## 2021-10-11 DIAGNOSIS — E782 Mixed hyperlipidemia: Secondary | ICD-10-CM

## 2021-10-11 DIAGNOSIS — I700 Atherosclerosis of aorta: Secondary | ICD-10-CM | POA: Diagnosis not present

## 2021-10-11 DIAGNOSIS — R079 Chest pain, unspecified: Secondary | ICD-10-CM | POA: Diagnosis not present

## 2021-10-11 DIAGNOSIS — R011 Cardiac murmur, unspecified: Secondary | ICD-10-CM | POA: Insufficient documentation

## 2021-10-11 MED ORDER — NITROGLYCERIN 0.4 MG SL SUBL: 0.4000 mg | SUBLINGUAL_TABLET | SUBLINGUAL | 6 refills | Status: DC | PRN

## 2021-10-11 NOTE — Patient Instructions (Addendum)

## 2021-10-11 NOTE — Progress Notes (Signed)
Cardiology Office Note:    Date:  10/11/2021   ID:  Sara Sanford, DOB 1956/03/10, MRN [id number]  PCP:  Hali Marry, MD  Cardiologist:  Jenean Lindau, MD   Referring MD: Hali Marry, *    ASSESSMENT:    1. Aortic atherosclerosis ([place])   2. Mixed hyperlipidemia   3. Chest pain, unspecified type   4. Morbid obesity ([place])    PLAN:    In order of problems listed above:  Aortic atherosclerosis: Secondary prevention stressed with the patient.  Importance of compliance with diet medication stressed and she vocalized understanding. Chest pain: Atypical in etiology for coronary artery disease but patient has multiple risk factors so we will do a Lexiscan sestamibi and she is agreeable.  Sublingual nitroglycerin prescription was sent, its protocol and 911 protocol explained and the patient vocalized understanding questions were answered to the patient's satisfaction Cardiac murmur: Echocardiogram will be done to assess murmur heard on auscultation. Mixed dyslipidemia: Diet was emphasized.  Her goal LDL should be less than 70 this is followed by primary care. Morbid obesity: Weight reduction stressed risks of obesity explained and she promises to do better. Patient will be seen in follow-up appointment in 6 months or earlier if the patient has any concerns    Medication Adjustments/Labs and Tests Ordered: Current medicines are reviewed at length with the patient today.  Concerns regarding medicines are outlined above.  Orders Placed This Encounter  Procedures   MYOCARDIAL PERFUSION IMAGING   EKG 12-Lead   ECHOCARDIOGRAM COMPLETE   Meds ordered this encounter  Medications   nitroGLYCERIN (NITROSTAT) 0.4 MG SL tablet    Sig: Place 1 tablet (0.4 mg total) under the tongue every 5 (five) minutes as needed.    Dispense:  25 tablet    Refill:  6     History of Present Illness:    Sara Sanford is a 65 y.o. female who is being seen today for the  evaluation of chest pain at the request of Hali Marry, *.  Patient is a pleasant 65 year old female.  She has past medical history of CLL, aortic atherosclerosis and dyslipidemia and obesity.  She leads a sedentary lifestyle because of orthopedic issues.  She mentions to me that she is referred here for hardening of the arteries.  She has also mixed dyslipidemia.  She mentions to me that she occasionally has chest discomfort and this is not related to exertion.  She cannot relate it to any stress or any such problems that are happening in her life.  Her chest pain occurs randomly.  At the time of my evaluation, the patient is alert awake oriented and in no distress.  Past Medical History:  Diagnosis Date   Abdominal aortic aneurysm 08/01/2014   3.2 cm seen on [place] 07/2014. No change 02/2018. Repeat in 3 yr.    Allergy    Anxiety    Aortic atherosclerosis ([place]) 08/23/2021   Arthritis    Cholelithiasis 08/17/2014   CLL (chronic lymphocytic leukemia) ([place]) 08/12/2019   GAD (generalized anxiety disorder) 02/17/2017   GERD 05/22/2009   Qualifier: Diagnosis of  By: Valetta Close DO, Karen     Heart burn    peptic ulcer   Hyperlipidemia 01/20/2020   Kidney stones    Left leg pain 09/09/2019   LEG EDEMA, BILATERAL 05/22/2009   Qualifier: Diagnosis of  By: Valetta Close DO, Karen     Lichen sclerosus    Macrocytic anemia 09/07/2019   OBESITY  05/22/2009   Qualifier: Diagnosis of  By: Esmeralda Arthur     POLYARTHRITIS 06/19/2010   Qualifier: Diagnosis of  By: Valetta Close DO, Santiago Glad     Post-menopausal    Primary osteoarthritis of left knee 09/09/2019   Rash and nonspecific skin eruption 01/03/2020   Recurrent UTI 06/30/2019    Past Surgical History:  Procedure Laterality Date   CARDIAC CATHETERIZATION  2008   CHOLECYSTECTOMY     COLONOSCOPY  2010   [place], [place]   DILATION AND CURETTAGE OF UTERUS     ESOPHAGOGASTRODUODENOSCOPY  2005   [place], [place] had egd and colon at the same time    GALLBLADDER SURGERY  2015    HYSTEROSCOPY WITH D & C  2008   KIDNEY STONE SURGERY  1977   UPPER GASTROINTESTINAL ENDOSCOPY      Current Medications: Current Meds  Medication Sig   Acalabrutinib Maleate (CALQUENCE) 100 MG TABS Take 1 tablet (100 mg) by mouth 2 (two) times daily.   clobetasol cream (TEMOVATE) 7.56 % Apply 1 application topically 3 (three) times a week.   FLUoxetine (PROZAC) 20 MG capsule TAKE ONE CAPSULE BY MOUTH DAILY   folic acid (FOLVITE) 1 MG tablet TAKE TWO TABLETS BY MOUTH DAILY   nitroGLYCERIN (NITROSTAT) 0.4 MG SL tablet Place 1 tablet (0.4 mg total) under the tongue every 5 (five) minutes as needed.   rosuvastatin (CRESTOR) 5 MG tablet Take 1 tablet (5 mg total) by mouth at bedtime.   triamcinolone cream (KENALOG) 0.1 % Apply 1 application topically daily. Rash on chest and arm   venetoclax (VENCLEXTA) 100 MG tablet TAKE 2 TABLETS (200 MG) BY MOUTH DAILY.   vitamin B-12 (CYANOCOBALAMIN) 1000 MCG tablet Take 1,000 mcg by mouth daily.     Allergies:   Propoxyphene, Keflex [cephalexin], and Nitrofurantoin   Social History   Socioeconomic History   Marital status: Married    Spouse name: Mical Brun   Number of children: 2   Years of education: 12th grade   Highest education level: 12th grade  Occupational History   Occupation: Cytogeneticist: Company secretary   Occupation: Retired  Tobacco Use   Smoking status: Never   Smokeless tobacco: Never  Scientific laboratory technician Use: Never used  Substance and Sexual Activity   Alcohol use: No   Drug use: Never   Sexual activity: Yes    Partners: Male    Birth control/protection: Post-menopausal  Other Topics Concern   Not on file  Social History Narrative   Lives with her husband. She has two children. She enjoys sewing and quilting.    Social Determinants of Health   Financial Resource Strain: Low Risk    Difficulty of Paying Living Expenses: Not hard at all  Food Insecurity: No Food Insecurity   Worried About Sales executive in the Last Year: Never true   [place] in the Last Year: Never true  Transportation Needs: No Transportation Needs   Lack of Transportation (Medical): No   Lack of Transportation (Non-Medical): No  Physical Activity: Inactive   Days of Exercise per Week: 0 days   Minutes of Exercise per Session: 0 min  Stress: No Stress Concern Present   Feeling of Stress : Not at all  Social Connections: Moderately Integrated   Frequency of Communication with Friends and Family: More than three times a week   Frequency of Social Gatherings with Friends and Family: Once a week  Attends Religious Services: Never   Active Member of Clubs or Organizations: Yes   Attends Music therapist: More than 4 times per year   Marital Status: Married     Family History: The patient's family history includes Breast cancer in her paternal aunt and paternal aunt; Colon polyps in her father; Heart attack in her father; Hypertension in her father and mother; Stomach cancer in her maternal grandmother. There is no history of Colon cancer or Rectal cancer.  ROS:   Please see the history of present illness.    All other systems reviewed and are negative.  EKGs/Labs/Other Studies Reviewed:    The following studies were reviewed today: I discussed my findings with the patient at length.  EKG reveals sinus rhythm and PACs and nonspecific ST-T changes   Recent Labs: 07/16/2021: ALT 14; BUN 12; Creatinine 0.72; Hemoglobin 13.3; Platelet Count 124; Potassium 3.9; Sodium 142 08/23/2021: TSH 2.60  Recent Lipid Panel    Component Value Date/Time   CHOL 204 (H) 08/23/2021 0000   TRIG 121 08/23/2021 0000   HDL 56 08/23/2021 0000   CHOLHDL 3.6 08/23/2021 0000   VLDL 11 08/29/2016 1013   LDLCALC 125 (H) 08/23/2021 0000    Physical Exam:    VS:  BP 98/60   Pulse 76   Ht 5\' 4"  (1.626 m)   Wt 238 lb (108 kg)   SpO2 98%   BMI 40.85 kg/m     Wt Readings from Last 3 Encounters:  10/11/21 238  lb (108 kg)  08/23/21 237 lb (107.5 kg)  07/23/21 234 lb (106.1 kg)     GEN: Patient is in no acute distress HEENT: Normal NECK: No JVD; No carotid bruits LYMPHATICS: No lymphadenopathy CARDIAC: S1 S2 regular, 2/6 systolic murmur at the apex. RESPIRATORY:  Clear to auscultation without rales, wheezing or rhonchi  ABDOMEN: Soft, non-tender, non-distended MUSCULOSKELETAL:  No edema; No deformity  SKIN: Warm and dry NEUROLOGIC:  Alert and oriented x 3 PSYCHIATRIC:  Normal affect    Signed, Jenean Lindau, MD  10/11/2021 2:22 PM    [facility]

## 2021-10-12 ENCOUNTER — Telehealth (HOSPITAL_COMMUNITY): Payer: Self-pay | Admitting: Cardiology

## 2021-10-12 NOTE — Telephone Encounter (Signed)
Patient called and cancelled Myoview for reason below:  10/12/2021 11:43 AM [id number] Sanford, Sara  Cancel Rsn: Patient-she will call back to reschedule due to her schedule at work.  Order will be removed from the [place].

## 2021-10-18 ENCOUNTER — Inpatient Hospital Stay: Payer: Medicare Other | Attending: Family

## 2021-10-18 ENCOUNTER — Other Ambulatory Visit: Payer: Self-pay

## 2021-10-18 ENCOUNTER — Inpatient Hospital Stay (HOSPITAL_BASED_OUTPATIENT_CLINIC_OR_DEPARTMENT_OTHER): Payer: Medicare Other | Admitting: Hematology & Oncology

## 2021-10-18 ENCOUNTER — Other Ambulatory Visit (HOSPITAL_COMMUNITY): Payer: Self-pay

## 2021-10-18 ENCOUNTER — Encounter: Payer: Self-pay | Admitting: Hematology & Oncology

## 2021-10-18 VITALS — BP 101/49 | HR 68 | Temp 98.2°F | Resp 18 | Wt 237.0 lb

## 2021-10-18 DIAGNOSIS — C9110 Chronic lymphocytic leukemia of B-cell type not having achieved remission: Secondary | ICD-10-CM | POA: Diagnosis present

## 2021-10-18 DIAGNOSIS — Z881 Allergy status to other antibiotic agents status: Secondary | ICD-10-CM | POA: Diagnosis not present

## 2021-10-18 DIAGNOSIS — R635 Abnormal weight gain: Secondary | ICD-10-CM | POA: Diagnosis not present

## 2021-10-18 DIAGNOSIS — Z79899 Other long term (current) drug therapy: Secondary | ICD-10-CM | POA: Insufficient documentation

## 2021-10-18 DIAGNOSIS — M7989 Other specified soft tissue disorders: Secondary | ICD-10-CM | POA: Diagnosis not present

## 2021-10-18 LAB — CBC WITH DIFFERENTIAL (CANCER CENTER ONLY)
Abs Immature Granulocytes: 0.01 10*3/uL (ref 0.00–0.07)
Basophils Absolute: 0 10*3/uL (ref 0.0–0.1)
Basophils Relative: 0 %
Eosinophils Absolute: 0 10*3/uL (ref 0.0–0.5)
Eosinophils Relative: 0 %
HCT: 37.1 % (ref 36.0–46.0)
Hemoglobin: 12.7 g/dL (ref 12.0–15.0)
Immature Granulocytes: 0 %
Lymphocytes Relative: 36 %
Lymphs Abs: 1.7 10*3/uL (ref 0.7–4.0)
MCH: 34 pg (ref 26.0–34.0)
MCHC: 34.2 g/dL (ref 30.0–36.0)
MCV: 99.2 fL (ref 80.0–100.0)
Monocytes Absolute: 0.3 10*3/uL (ref 0.1–1.0)
Monocytes Relative: 7 %
Neutro Abs: 2.6 10*3/uL (ref 1.7–7.7)
Neutrophils Relative %: 57 %
Platelet Count: 140 10*3/uL — ABNORMAL LOW (ref 150–400)
RBC: 3.74 MIL/uL — ABNORMAL LOW (ref 3.87–5.11)
RDW: 12.4 % (ref 11.5–15.5)
WBC Count: 4.6 10*3/uL (ref 4.0–10.5)
nRBC: 0 % (ref 0.0–0.2)

## 2021-10-18 LAB — LACTATE DEHYDROGENASE: LDH: 196 U/L — ABNORMAL HIGH (ref 98–192)

## 2021-10-18 LAB — CMP (CANCER CENTER ONLY)
ALT: 14 U/L (ref 0–44)
AST: 16 U/L (ref 15–41)
Albumin: 4.2 g/dL (ref 3.5–5.0)
Alkaline Phosphatase: 68 U/L (ref 38–126)
Anion gap: 6 (ref 5–15)
BUN: 14 mg/dL (ref 8–23)
CO2: 28 mmol/L (ref 22–32)
Calcium: 9 mg/dL (ref 8.9–10.3)
Chloride: 108 mmol/L (ref 98–111)
Creatinine: 0.71 mg/dL (ref 0.44–1.00)
GFR, Estimated: >60 mL/min (ref >60)
Glucose, Bld: 92 mg/dL (ref 70–99)
Potassium: 4.1 mmol/L (ref 3.5–5.1)
Sodium: 142 mmol/L (ref 135–145)
Total Bilirubin: 0.6 mg/dL (ref 0.3–1.2)
Total Protein: 5.8 g/dL — ABNORMAL LOW (ref 6.5–8.1)

## 2021-10-18 LAB — SAVE SMEAR(SSMR), FOR PROVIDER SLIDE REVIEW

## 2021-10-18 NOTE — Progress Notes (Signed)
Hematology and Oncology Follow Up Visit  Sara Sanford [id number] 01/19/1956 65 y.o. 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 is here today for follow-up.  She really is bothered by her left knee.  She really cannot do much in the way of exercise because of the left knee.  She is gained weight.  She is not eating all that much.  I just think that she is taking in more calories and she is able to burn.  I told her that water aerobics might be a good idea for her.  She has done well with the Calquence/venetoclax.  She has been on both now for over a year.  I told her that 2 years of therapy should be all that we have to do and then we stop treatment.  She has had no cough or shortness of breath.  She has had no change in bowel or bladder habits.  She has had no rashes.  There is been limited leg swelling.  I think this is more chronic than anything else.  I know she will have a nice Thanksgiving with her family.  Overall, I would say performance status is ECOG 1.    Medications:  Allergies as of 10/18/2021       Reactions   Propoxyphene Anaphylaxis, Shortness Of Breath, Other (See Comments)   Hallucinations   Keflex [cephalexin] Other (See Comments)   Diarrrhea   Nitrofurantoin Diarrhea, Rash   Welts with blisters        Medication List        Accurate as of October 18, 2021  1:01 PM. If you have any questions, ask your nurse or doctor.          Calquence 100 MG Tabs Generic drug: Acalabrutinib  Maleate Take 1 tablet (100 mg) by mouth 2 (two) times daily.   clobetasol cream 0.05 % Commonly known as: TEMOVATE Apply 1 application topically 3 (three) times a week.   FLUoxetine 20 MG capsule Commonly known as: PROZAC TAKE ONE CAPSULE BY MOUTH DAILY   folic acid 1 MG tablet Commonly known as: FOLVITE TAKE TWO TABLETS BY MOUTH DAILY   nitroGLYCERIN 0.4 MG SL tablet Commonly known as: NITROSTAT Place 1 tablet (0.4 mg total) under the tongue every 5 (five) minutes as needed.   rosuvastatin 5 MG tablet Commonly known as: Crestor Take 1 tablet (5 mg total) by mouth at bedtime.   triamcinolone cream 0.1 % Commonly known as: KENALOG Apply 1 application topically daily. Rash on chest and arm   Venclexta 100 MG tablet Generic drug: venetoclax  TAKE 2 TABLETS (200 MG) BY MOUTH DAILY.   vitamin B-12 1000 MCG tablet Commonly known as: CYANOCOBALAMIN Take 1,000 mcg by mouth daily.        Allergies:  Allergies  Allergen Reactions   Propoxyphene Anaphylaxis, Shortness Of Breath and Other (See Comments)    Hallucinations    Keflex [Cephalexin] Other (See Comments)    Diarrrhea   Nitrofurantoin Diarrhea and Rash    Welts with blisters    Past Medical History, Surgical history, Social history, and Family History were reviewed and updated.  Review of Systems: Review of Systems  Constitutional: Negative.   HENT: Negative.    Eyes: Negative.   Respiratory: Negative.    Cardiovascular: Negative.   Gastrointestinal: Negative.   Genitourinary: Negative.   Musculoskeletal: Negative.   Skin: Negative.   Neurological: Negative.   Endo/Heme/Allergies: Negative.   Psychiatric/Behavioral: Negative.      Physical Exam:  weight is 237 lb (107.5 kg). Her oral temperature is 98.2 F (36.8 C). Her blood pressure is 101/49 (abnormal) and her pulse is 68. Her respiration is 18 and oxygen saturation is 98%.   Wt Readings from Last 3 Encounters:  10/18/21 237 lb (107.5 kg)   10/11/21 238 lb (108 kg)  08/23/21 237 lb (107.5 kg)    Physical Exam Vitals reviewed.  HENT:     Head: Normocephalic and atraumatic.  Eyes:     Pupils: Pupils are equal, round, and reactive to light.  Cardiovascular:     Rate and Rhythm: Normal rate and regular rhythm.     Heart sounds: Normal heart sounds.  Pulmonary:     Effort: Pulmonary effort is normal.     Breath sounds: Normal breath sounds.  Abdominal:     General: Bowel sounds are normal.     Palpations: Abdomen is soft.  Musculoskeletal:        General: No tenderness or deformity. Normal range of motion.     Cervical back: Normal range of motion.  Lymphadenopathy:     Cervical: No cervical adenopathy.  Skin:    General: Skin is warm and dry.     Findings: No erythema or rash.  Neurological:     Mental Status: She is alert and oriented to person, place, and time.  Psychiatric:        Behavior: Behavior normal.        Thought Content: Thought content normal.        Judgment: Judgment normal.     Lab Results  Component Value Date   WBC 4.6 10/18/2021   HGB 12.7 10/18/2021   HCT 37.1 10/18/2021   MCV 99.2 10/18/2021   PLT 140 (L) 10/18/2021   Lab Results  Component Value Date   FERRITIN 25 05/15/2020   IRON 89 05/15/2020   TIBC 344 05/15/2020   UIBC 255 05/15/2020   IRONPCTSAT 26 05/15/2020   Lab Results  Component Value Date   RETICCTPCT 2.5 05/15/2020   RBC 3.74 (L) 10/18/2021   Lab Results  Component Value Date   KPAFRELGTCHN 14.6 04/09/2021   LAMBDASER 3.5 (L) 04/09/2021   KAPLAMBRATIO 4.17 (H) 04/09/2021   Lab Results  Component Value Date   IGGSERUM 484 (L) 07/16/2021   IGA 19 (L) 07/16/2021   IGMSERUM 26 07/16/2021   No results found for: Ronnald Ramp, A1GS, A2GS, Arnaldo Natal, GAMS, MSPIKE, SPEI   Chemistry      Component Value Date/Time   NA 142 10/18/2021 1134   K 4.1 10/18/2021 1134  CL 108 10/18/2021 1134   CO2 28 10/18/2021 1134   BUN 14 10/18/2021 1134    CREATININE 0.71 10/18/2021 1134   CREATININE 0.69 02/01/2020 1149      Component Value Date/Time   CALCIUM 9.0 10/18/2021 1134   ALKPHOS 68 10/18/2021 1134   AST 16 10/18/2021 1134   ALT 14 10/18/2021 1134   BILITOT 0.6 10/18/2021 1134       Impression and Plan: [name] is a very pleasant 65 yo caucasian female with CLL.  I think she has relatively decent prognostic markers.  She has responded quite nicely to the acalabrutinib/venetoclax combination.  Her lymphocytes are going down slowly but surely.  Again, I had believe that after 1 more year of therapy, we can get her off.  I will have to believe that she will have minimal residual disease if anything.  Clinically, her biggest problem is the knee and the weight.  I told her that she could certainly have knee surgery if she needed from my point of view.  I still think we can get her back in 3 months.  We will get her up through the holiday season.   Volanda Napoleon, MD 11/10/20221:01 PM

## 2021-10-19 ENCOUNTER — Encounter (HOSPITAL_COMMUNITY): Payer: Medicare Other

## 2021-10-19 LAB — KAPPA/LAMBDA LIGHT CHAINS
Kappa free light chain: 11.9 mg/L (ref 3.3–19.4)
Kappa, lambda light chain ratio: 4.41 — ABNORMAL HIGH (ref 0.26–1.65)
Lambda free light chains: 2.7 mg/L — ABNORMAL LOW (ref 5.7–26.3)

## 2021-10-19 LAB — IGG, IGA, IGM
IgA: 18 mg/dL — ABNORMAL LOW (ref 87–352)
IgG (Immunoglobin G), Serum: 438 mg/dL — ABNORMAL LOW (ref 586–1602)
IgM (Immunoglobulin M), Srm: 28 mg/dL (ref 26–217)

## 2021-10-21 LAB — PROTEIN ELECTROPHORESIS, SERUM, WITH REFLEX
A/G Ratio: 1.8 — ABNORMAL HIGH (ref 0.7–1.7)
Albumin ELP: 3.5 g/dL (ref 2.9–4.4)
Alpha-1-Globulin: 0.2 g/dL (ref 0.0–0.4)
Alpha-2-Globulin: 0.6 g/dL (ref 0.4–1.0)
Beta Globulin: 0.9 g/dL (ref 0.7–1.3)
Gamma Globulin: 0.4 g/dL (ref 0.4–1.8)
Globulin, Total: 2 g/dL — ABNORMAL LOW (ref 2.2–3.9)
Total Protein ELP: 5.5 g/dL — ABNORMAL LOW (ref 6.0–8.5)

## 2021-10-22 ENCOUNTER — Ambulatory Visit (HOSPITAL_COMMUNITY): Payer: Medicare Other

## 2021-10-23 ENCOUNTER — Ambulatory Visit (HOSPITAL_COMMUNITY): Payer: Medicare Other

## 2021-10-28 IMAGING — CT CT CHEST-ABD-PELV W/ CM
3 of 5 series · 15 of 36 positions shown, 17 images · IV contrast (Omnipaque)
Comparison: CT the chest, abdomen and pelvis 05/18/2020.

CLINICAL DATA: 64-year-old female with history of CLL on therapy.
Follow-up study.

EXAM:
CT CHEST, ABDOMEN, AND PELVIS WITH CONTRAST
TECHNIQUE: Multidetector CT imaging of the chest, abdomen and pelvis was
performed following the standard protocol during bolus
administration of intravenous contrast.
CONTRAST:  100mL OMNIPAQUE IOHEXOL 300 MG/ML  SOLN

[Series 2: cap with 2 · axial · 0.95mm/px · z∈[-600,-100]mm · 9 of 126 slices shown, 11 images]
[im 13/126  mediastinal]
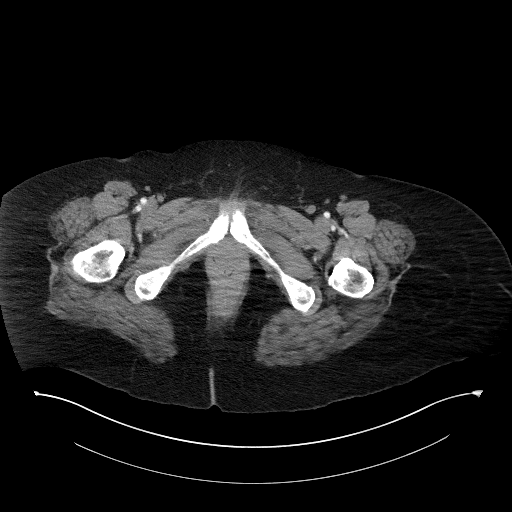
[im 13/126  bone]
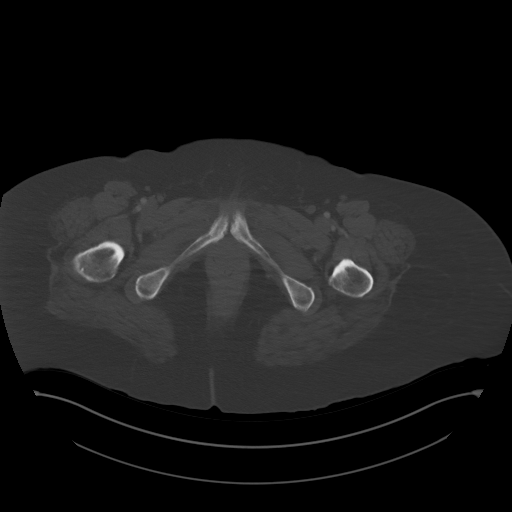
[im 26/126  mediastinal]
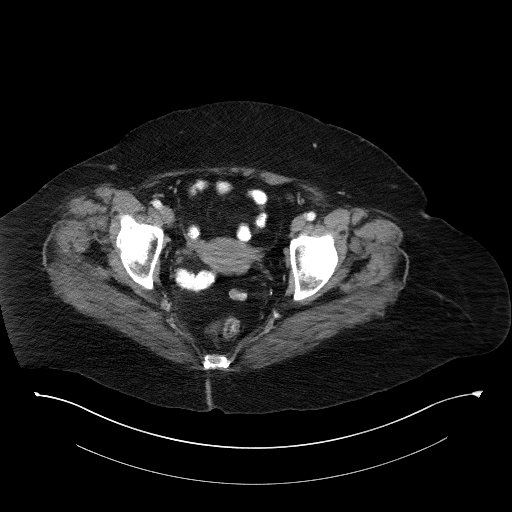
[im 38/126  mediastinal]
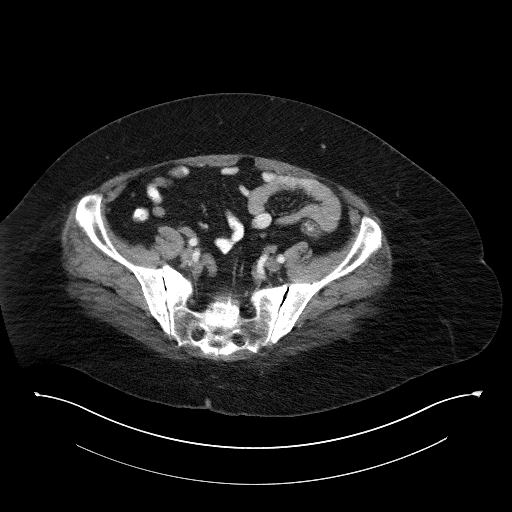
[im 51/126  mediastinal]
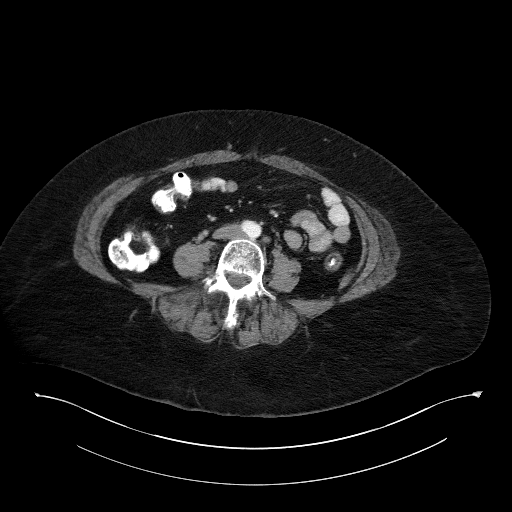
[im 63/126  mediastinal]
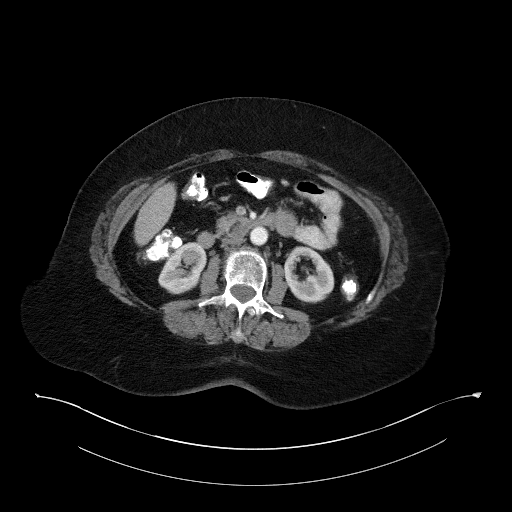
[im 76/126  mediastinal]
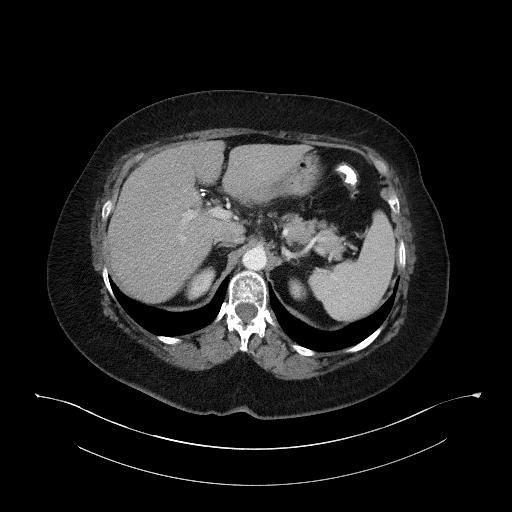
[im 88/126  mediastinal]
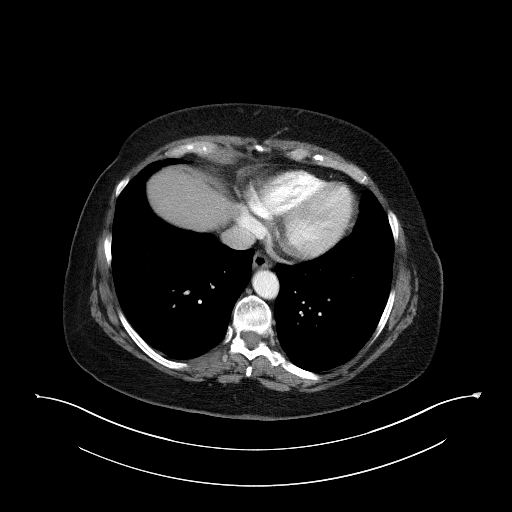
[im 101/126  mediastinal]
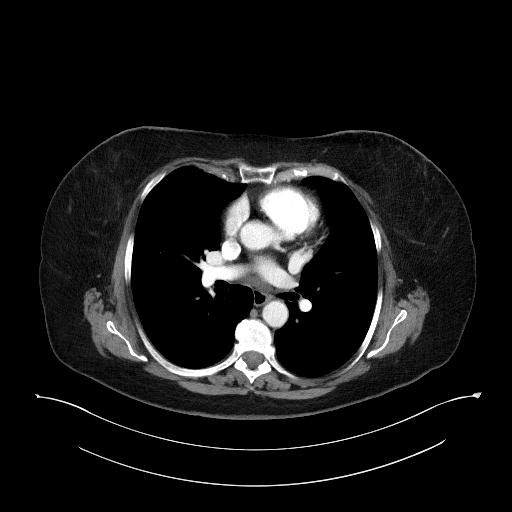
[im 113/126  mediastinal]
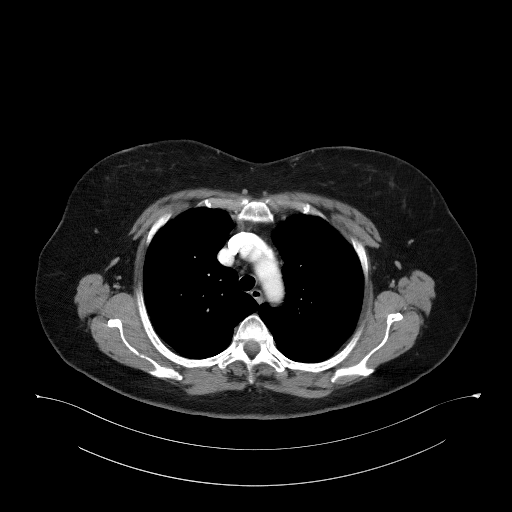
[im 113/126  bone]
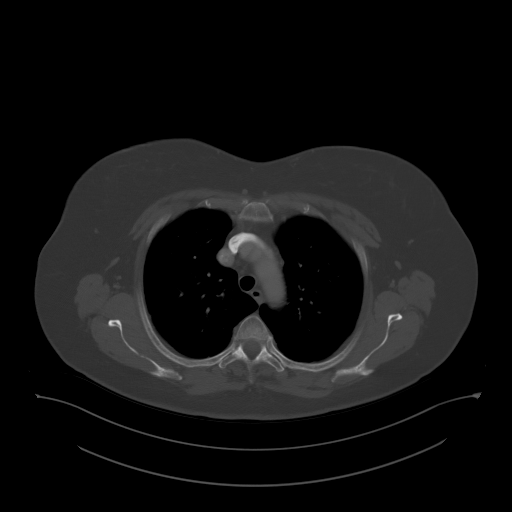

[Series 4: lung · axial · 0.80mm/px · z∈[-309,-241]mm · 3 of 149 slices shown]
[im 12/149  bone]
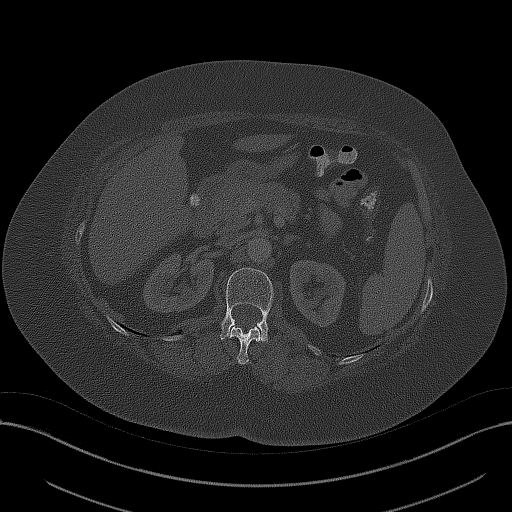
[im 35/149  bone]
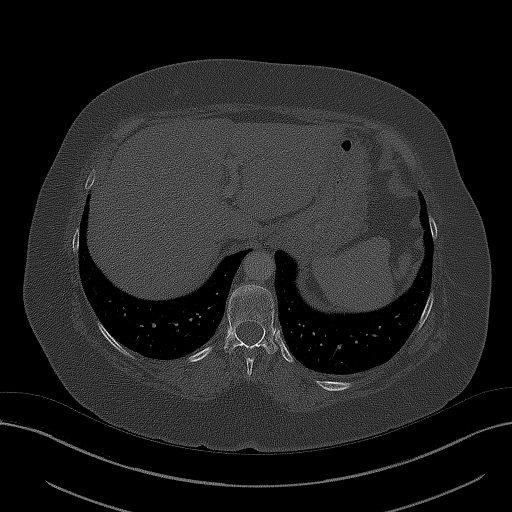
[im 46/149  bone]
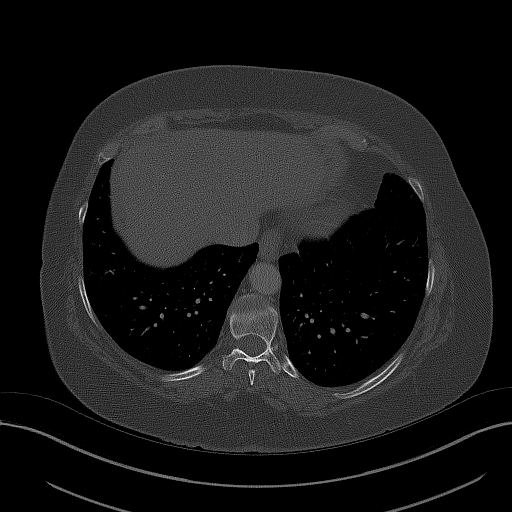

[Series 5: coronals · coronal · 0.90mm/px · 3 of 151 slices shown]
[im 31/151  mediastinal]
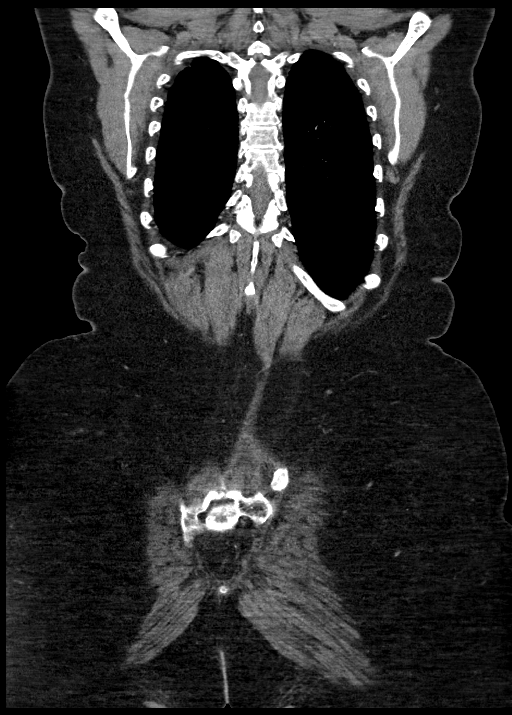
[im 61/151  mediastinal]
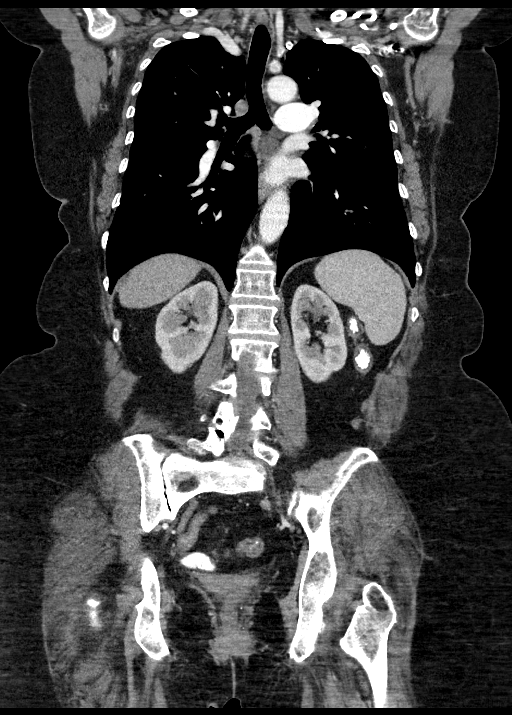
[im 91/151  mediastinal]
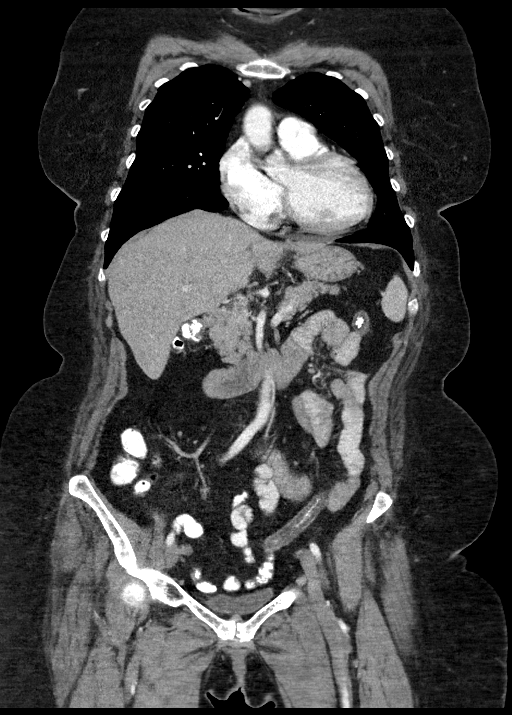

[15 of 36 positions shown; findings below may reference images not displayed]

FINDINGS: CT CHEST FINDINGS

Cardiovascular: Heart size is normal. There is no significant
pericardial fluid, thickening or pericardial calcification. Aortic
atherosclerosis. No definite coronary artery calcifications.

Mediastinum/Nodes: No pathologically enlarged mediastinal or hilar
lymph nodes. Esophagus is unremarkable in appearance. No axillary
lymphadenopathy.

Lungs/Pleura: No suspicious appearing pulmonary nodules or masses
are noted. No acute consolidative airspace disease. No pleural
effusions.

Musculoskeletal: There are no aggressive appearing lytic or blastic
lesions noted in the visualized portions of the skeleton.

CT ABDOMEN PELVIS FINDINGS

Hepatobiliary: No suspicious cystic or solid hepatic lesions. No
intra or extrahepatic biliary ductal dilatation. Status post
cholecystectomy.

Pancreas: No pancreatic mass. No pancreatic ductal dilatation. No
pancreatic or peripancreatic fluid collections or inflammatory
changes.

Spleen: Normal in size and unremarkable in appearance.

Adrenals/Urinary Tract: Bilateral kidneys and adrenal glands are
normal in appearance. No hydroureteronephrosis. Urinary bladder is
normal in appearance.

Stomach/Bowel: Unenhanced appearance of the stomach is normal. No
pathologic dilatation of small bowel or colon. Normal appendix.

Vascular/Lymphatic: No significant atherosclerotic disease, aneurysm
or dissection noted in the abdominal or pelvic vasculature.
Prominent mesorectal lymph nodes are again noted, many of which are
partially calcified, largest of which measures 1 cm in short axis
(axial image 101 of series 2), stable compared to the prior
examination. No other lymphadenopathy is noted elsewhere in the
abdomen or pelvis.

Reproductive: Uterus and ovaries are unremarkable in appearance.

Other: No significant volume of ascites.  No pneumoperitoneum.

Musculoskeletal: There are no aggressive appearing lytic or blastic
lesions noted in the visualized portions of the skeleton.
IMPRESSION: 1. No evidence to suggest recurrent disease in the chest, abdomen or
pelvis.
2. Prominent borderline enlarged and mildly enlarged mesorectal
lymph nodes which are partially calcified, similar to the prior
study, likely to represent treated nodal disease.
3. Aortic atherosclerosis.

## 2021-11-06 ENCOUNTER — Other Ambulatory Visit (HOSPITAL_COMMUNITY): Payer: Self-pay

## 2021-11-07 ENCOUNTER — Other Ambulatory Visit: Payer: Self-pay

## 2021-11-07 ENCOUNTER — Other Ambulatory Visit (HOSPITAL_COMMUNITY): Payer: Self-pay

## 2021-11-07 ENCOUNTER — Ambulatory Visit (HOSPITAL_BASED_OUTPATIENT_CLINIC_OR_DEPARTMENT_OTHER)
Admission: RE | Admit: 2021-11-07 | Discharge: 2021-11-07 | Disposition: A | Discharge status: Home / Self Care | Payer: Medicare Other | Source: Ambulatory Visit | Attending: Cardiology | Admitting: Cardiology

## 2021-11-07 DIAGNOSIS — R079 Chest pain, unspecified: Secondary | ICD-10-CM | POA: Diagnosis present

## 2021-11-07 LAB — ECHOCARDIOGRAM COMPLETE
AR max vel: 2.81 cm2
AV Area VTI: 2.68 cm2
AV Area mean vel: 2.56 cm2
AV Mean grad: 7 mmHg
AV Peak grad: 11.4 mmHg
Ao pk vel: 1.69 m/s
Area-P 1/2: 3.76 cm2
Calc EF: 63.6 %
MV M vel: 4.99 m/s
MV Peak grad: 99.5 mmHg
MV VTI: 3.04 cm2
Radius: 0.5 cm
S' Lateral: 3.4 cm
Single Plane A2C EF: 54.5 %
Single Plane A4C EF: 69.6 %

## 2021-11-13 ENCOUNTER — Ambulatory Visit (INDEPENDENT_AMBULATORY_CARE_PROVIDER_SITE_OTHER): Payer: Medicare Other | Admitting: Family Medicine

## 2021-11-13 ENCOUNTER — Other Ambulatory Visit: Payer: Self-pay

## 2021-11-13 VITALS — BP 119/62 | HR 58 | Temp 97.9°F

## 2021-11-13 DIAGNOSIS — R300 Dysuria: Secondary | ICD-10-CM

## 2021-11-13 DIAGNOSIS — N3000 Acute cystitis without hematuria: Secondary | ICD-10-CM | POA: Diagnosis not present

## 2021-11-13 LAB — POCT URINALYSIS DIP (CLINITEK)
Bilirubin, UA: NEGATIVE
Glucose, UA: NEGATIVE mg/dL
Ketones, POC UA: NEGATIVE mg/dL
Nitrite, UA: NEGATIVE
Spec Grav, UA: 1.02 (ref 1.010–1.025)
Urobilinogen, UA: 0.2 E.U./dL
pH, UA: 7 (ref 5.0–8.0)

## 2021-11-13 MED ORDER — SULFAMETHOXAZOLE-TRIMETHOPRIM 800-160 MG PO TABS: 1.0000 | ORAL_TABLET | Freq: Two times a day (BID) | ORAL | 0 refills | Status: DC

## 2021-11-13 NOTE — Progress Notes (Signed)
Established Patient Office Visit  Subjective:  Patient ID: Sara Sanford, female    DOB: Apr 12, 1956  Age: 65 y.o. MRN: [id number]  CC:  Chief Complaint  Patient presents with   Dysuria    HPI Sara Sanford complains of painful urination for 1 day. Patient reports no antibiotic use or no recent catheterization. Patient has taken Azo. Denies flank pain, pelvic pain, fever, chills or sweats.    Past Medical History:  Diagnosis Date   Abdominal aortic aneurysm 08/01/2014   3.2 cm seen on [place] 07/2014. No change 02/2018. Repeat in 3 yr.    Allergy    Anxiety    Aortic atherosclerosis ([place]) 08/23/2021   Arthritis    Cholelithiasis 08/17/2014   CLL (chronic lymphocytic leukemia) ([employer]) 08/12/2019   GAD (generalized anxiety disorder) 02/17/2017   GERD 05/22/2009   Qualifier: Diagnosis of  By: Valetta Close DO, Karen     Heart burn    peptic ulcer   Hyperlipidemia 01/20/2020   Kidney stones    Left leg pain 09/09/2019   LEG EDEMA, BILATERAL 05/22/2009   Qualifier: Diagnosis of  By: Valetta Close DO, Karen     Lichen sclerosus    Macrocytic anemia 09/07/2019   OBESITY 05/22/2009   Qualifier: Diagnosis of  By: Esmeralda Arthur     POLYARTHRITIS 06/19/2010   Qualifier: Diagnosis of  By: Valetta Close DO, Karen     Post-menopausal    Primary osteoarthritis of left knee 09/09/2019   Rash and nonspecific skin eruption 01/03/2020   Recurrent UTI 06/30/2019    Past Surgical History:  Procedure Laterality Date   CARDIAC CATHETERIZATION  2008   CHOLECYSTECTOMY     COLONOSCOPY  2010   [place], [place]   DILATION AND CURETTAGE OF UTERUS     ESOPHAGOGASTRODUODENOSCOPY  2005   [place],  had egd and colon at the same time    GALLBLADDER SURGERY  2015   HYSTEROSCOPY WITH D & C  2008   KIDNEY STONE SURGERY  1977   UPPER GASTROINTESTINAL ENDOSCOPY      Family History  Problem Relation Age of Onset   Breast cancer Paternal Aunt        pm breast CA   Breast cancer Paternal Aunt        PM breast CA   Heart  attack Father    Hypertension Father    Colon polyps Father    Hypertension Mother    Stomach cancer Maternal Grandmother    Colon cancer Neg Hx    Rectal cancer Neg Hx     Social History   Socioeconomic History   Marital status: Married    Spouse name: Molli Gethers   Number of children: 2   Years of education: 12th grade   Highest education level: 12th grade  Occupational History   Occupation: Cytogeneticist: Company secretary   Occupation: Retired  Tobacco Use   Smoking status: Never   Smokeless tobacco: Never  Scientific laboratory technician Use: Never used  Substance and Sexual Activity   Alcohol use: No   Drug use: Never   Sexual activity: Yes    Partners: Male    Birth control/protection: Post-menopausal  Other Topics Concern   Not on file  Social History Narrative   Lives with her husband. She has two children. She enjoys sewing and quilting.    Social Determinants of Health   Financial Resource Strain: Low Risk    Difficulty of Paying Living Expenses:  Not hard at all  Food Insecurity: No Food Insecurity   Worried About Charity fundraiser in the Last Year: Never true   Ran Out of Food in the Last Year: Never true  Transportation Needs: No Transportation Needs   Lack of Transportation (Medical): No   Lack of Transportation (Non-Medical): No  Physical Activity: Inactive   Days of Exercise per Week: 0 days   Minutes of Exercise per Session: 0 min  Stress: No Stress Concern Present   Feeling of Stress : Not at all  Social Connections: Moderately Integrated   Frequency of Communication with Friends and Family: More than three times a week   Frequency of Social Gatherings with Friends and Family: Once a week   Attends Religious Services: Never   Marine scientist or Organizations: Yes   Attends Music therapist: More than 4 times per year   Marital Status: Married  Human resources officer Violence: Not At Risk   Fear of Current or Ex-Partner: No    Emotionally Abused: No   Physically Abused: No   Sexually Abused: No    Outpatient Medications Prior to Visit  Medication Sig Dispense Refill   Acalabrutinib Maleate (CALQUENCE) 100 MG TABS Take 1 tablet (100 mg) by mouth 2 (two) times daily. 60 tablet 3   clobetasol cream (TEMOVATE) 6.72 % Apply 1 application topically 3 (three) times a week.     FLUoxetine (PROZAC) 20 MG capsule TAKE ONE CAPSULE BY MOUTH DAILY 90 capsule 1   folic acid (FOLVITE) 1 MG tablet TAKE TWO TABLETS BY MOUTH DAILY 60 tablet 12   nitroGLYCERIN (NITROSTAT) 0.4 MG SL tablet Place 1 tablet (0.4 mg total) under the tongue every 5 (five) minutes as needed. 25 tablet 6   rosuvastatin (CRESTOR) 5 MG tablet Take 1 tablet (5 mg total) by mouth at bedtime. 90 tablet 3   triamcinolone cream (KENALOG) 0.1 % Apply 1 application topically daily. Rash on chest and arm 45 g 0   venetoclax (VENCLEXTA) 100 MG tablet TAKE 2 TABLETS (200 MG) BY MOUTH DAILY. 60 tablet 3   vitamin B-12 (CYANOCOBALAMIN) 1000 MCG tablet Take 1,000 mcg by mouth daily.     No facility-administered medications prior to visit.    Allergies  Allergen Reactions   Propoxyphene Anaphylaxis, Shortness Of Breath and Other (See Comments)    Hallucinations    Keflex [Cephalexin] Other (See Comments)    Diarrrhea   Nitrofurantoin Diarrhea and Rash    Welts with blisters    ROS Review of Systems    Objective:    Physical Exam  BP 119/62   Pulse (!) 58   Temp 97.9 F (36.6 C) (Oral)   SpO2 99%  Wt Readings from Last 3 Encounters:  10/18/21 237 lb (107.5 kg)  10/11/21 238 lb (108 kg)  08/23/21 237 lb (107.5 kg)     Health Maintenance Due  Topic Date Due   OPHTHALMOLOGY EXAM  Never done   URINE MICROALBUMIN  Never done   Zoster Vaccines- Shingrix (1 of 2) Never done   COVID-19 Vaccine (3 - Moderna risk series) 03/18/2020   Pneumonia Vaccine 34+ Years old (2 - PPSV23 if available, else PCV20) 09/08/2020   HEMOGLOBIN A1C  12/01/2020    INFLUENZA VACCINE  07/09/2021    There are no preventive care reminders to display for this patient.  Lab Results  Component Value Date   TSH 2.60 08/23/2021   Lab Results  Component Value Date  WBC 4.6 10/18/2021   HGB 12.7 10/18/2021   HCT 37.1 10/18/2021   MCV 99.2 10/18/2021   PLT 140 (L) 10/18/2021   Lab Results  Component Value Date   NA 142 10/18/2021   K 4.1 10/18/2021   CO2 28 10/18/2021   GLUCOSE 92 10/18/2021   BUN 14 10/18/2021   CREATININE 0.71 10/18/2021   BILITOT 0.6 10/18/2021   ALKPHOS 68 10/18/2021   AST 16 10/18/2021   ALT 14 10/18/2021   PROT 5.8 (L) 10/18/2021   ALBUMIN 4.2 10/18/2021   CALCIUM 9.0 10/18/2021   ANIONGAP 6 10/18/2021   Lab Results  Component Value Date   CHOL 204 (H) 08/23/2021   Lab Results  Component Value Date   HDL 56 08/23/2021   Lab Results  Component Value Date   LDLCALC 125 (H) 08/23/2021   Lab Results  Component Value Date   TRIG 121 08/23/2021   Lab Results  Component Value Date   CHOLHDL 3.6 08/23/2021   Lab Results  Component Value Date   HGBA1C 5.1 06/01/2020      Assessment & Plan:  Dysuria - Positive urinalysis - Dr Madilyn Fireman will send in an antibiotic after review of medications.   Problem List Items Addressed This Visit   None Visit Diagnoses     Dysuria    -  Primary   Relevant Orders   POCT URINALYSIS DIP (CLINITEK) (Completed)   Urine Culture       No orders of the defined types were placed in this encounter.   Follow-up: Return if symptoms worsen or fail to improve.    Lavell Luster, [place]

## 2021-11-13 NOTE — Progress Notes (Signed)
Urinary tract infection-urinalysis consistent with infection.  Will treat with Bactrim DS x3 days.  Culture sent.  We will call with results once available.  Call if any new or worsening symptoms.  Beatrice Lecher, MD

## 2021-11-16 LAB — URINE CULTURE
MICRO NUMBER:: 12723640
SPECIMEN QUALITY:: ADEQUATE

## 2021-11-16 MED ORDER — AMOXICILLIN-POT CLAVULANATE 875-125 MG PO TABS: 1.0000 | ORAL_TABLET | Freq: Two times a day (BID) | ORAL | 0 refills | Status: DC

## 2021-11-16 NOTE — Addendum Note (Signed)
Addended by: Beatrice Lecher D on: 11/16/2021 09:12 AM   Modules accepted: Orders

## 2021-11-16 NOTE — Progress Notes (Signed)
Hi Sara Sanford, urine culture did show very small amount of bacteria but not enough that it technically qualifies as a urinary tract infection.  Are you still having any symptoms?  Please let us know

## 2021-11-16 NOTE — Progress Notes (Signed)
Based on cultures and sensitivities I will send over new antibiotic.

## 2021-12-04 ENCOUNTER — Other Ambulatory Visit (HOSPITAL_COMMUNITY): Payer: Self-pay

## 2021-12-05 ENCOUNTER — Other Ambulatory Visit: Payer: Self-pay | Admitting: Family Medicine

## 2021-12-06 ENCOUNTER — Other Ambulatory Visit (HOSPITAL_COMMUNITY): Payer: Self-pay

## 2021-12-10 ENCOUNTER — Other Ambulatory Visit (HOSPITAL_COMMUNITY): Payer: Self-pay

## 2021-12-11 ENCOUNTER — Other Ambulatory Visit (HOSPITAL_COMMUNITY): Payer: Self-pay

## 2022-01-02 ENCOUNTER — Other Ambulatory Visit (HOSPITAL_COMMUNITY): Payer: Self-pay

## 2022-01-03 ENCOUNTER — Other Ambulatory Visit (HOSPITAL_COMMUNITY): Payer: Self-pay

## 2022-01-18 ENCOUNTER — Other Ambulatory Visit: Payer: Self-pay

## 2022-01-18 ENCOUNTER — Inpatient Hospital Stay (HOSPITAL_BASED_OUTPATIENT_CLINIC_OR_DEPARTMENT_OTHER): Payer: Medicare Other | Admitting: Hematology & Oncology

## 2022-01-18 ENCOUNTER — Inpatient Hospital Stay: Payer: Medicare Other | Attending: Family

## 2022-01-18 ENCOUNTER — Encounter: Payer: Self-pay | Admitting: Hematology & Oncology

## 2022-01-18 VITALS — BP 91/52 | HR 61 | Temp 98.1°F | Resp 18 | Wt 236.0 lb

## 2022-01-18 DIAGNOSIS — Z79899 Other long term (current) drug therapy: Secondary | ICD-10-CM | POA: Diagnosis not present

## 2022-01-18 DIAGNOSIS — C9110 Chronic lymphocytic leukemia of B-cell type not having achieved remission: Secondary | ICD-10-CM | POA: Insufficient documentation

## 2022-01-18 DIAGNOSIS — Z881 Allergy status to other antibiotic agents status: Secondary | ICD-10-CM | POA: Diagnosis not present

## 2022-01-18 LAB — CMP (CANCER CENTER ONLY)
ALT: 16 U/L (ref 0–44)
AST: 18 U/L (ref 15–41)
Albumin: 3.9 g/dL (ref 3.5–5.0)
Alkaline Phosphatase: 65 U/L (ref 38–126)
Anion gap: 7 (ref 5–15)
BUN: 14 mg/dL (ref 8–23)
CO2: 28 mmol/L (ref 22–32)
Calcium: 8.7 mg/dL — ABNORMAL LOW (ref 8.9–10.3)
Chloride: 106 mmol/L (ref 98–111)
Creatinine: 0.75 mg/dL (ref 0.44–1.00)
GFR, Estimated: >60 mL/min (ref >60)
Glucose, Bld: 86 mg/dL (ref 70–99)
Potassium: 4.7 mmol/L (ref 3.5–5.1)
Sodium: 141 mmol/L (ref 135–145)
Total Bilirubin: 0.7 mg/dL (ref 0.3–1.2)
Total Protein: 5.9 g/dL — ABNORMAL LOW (ref 6.5–8.1)

## 2022-01-18 LAB — CBC WITH DIFFERENTIAL (CANCER CENTER ONLY)
Abs Immature Granulocytes: 0.01 10*3/uL (ref 0.00–0.07)
Basophils Absolute: 0 10*3/uL (ref 0.0–0.1)
Basophils Relative: 0 %
Eosinophils Absolute: 0 10*3/uL (ref 0.0–0.5)
Eosinophils Relative: 0 %
HCT: 37.5 % (ref 36.0–46.0)
Hemoglobin: 12.9 g/dL (ref 12.0–15.0)
Immature Granulocytes: 0 %
Lymphocytes Relative: 35 %
Lymphs Abs: 1.8 10*3/uL (ref 0.7–4.0)
MCH: 33.9 pg (ref 26.0–34.0)
MCHC: 34.4 g/dL (ref 30.0–36.0)
MCV: 98.4 fL (ref 80.0–100.0)
Monocytes Absolute: 0.6 10*3/uL (ref 0.1–1.0)
Monocytes Relative: 11 %
Neutro Abs: 2.7 10*3/uL (ref 1.7–7.7)
Neutrophils Relative %: 54 %
Platelet Count: 135 10*3/uL — ABNORMAL LOW (ref 150–400)
RBC: 3.81 MIL/uL — ABNORMAL LOW (ref 3.87–5.11)
RDW: 12.3 % (ref 11.5–15.5)
WBC Count: 5.1 10*3/uL (ref 4.0–10.5)
nRBC: 0 % (ref 0.0–0.2)

## 2022-01-18 LAB — SAVE SMEAR(SSMR), FOR PROVIDER SLIDE REVIEW

## 2022-01-18 LAB — LACTATE DEHYDROGENASE: LDH: 197 U/L — ABNORMAL HIGH (ref 98–192)

## 2022-01-18 NOTE — Progress Notes (Signed)
Hematology and Oncology Follow Up Visit  Sara Sanford [id number] [birth year] 66 y.o. 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  We last saw her about 3 months ago.  She had no problems over the holiday season.  She still having problems with the left knee.,  Sure she is going to be able to have this operated on at some point.  She has had no problems with the Calquence in the venetoclax combination.  She is done well with both of these.  She has had some occasional diarrhea.  She has had no problems with nausea or vomiting.  She has had no rashes.  There is been no bleeding.  She has had no swollen lymph nodes.  Overall, I would say performance status is ECOG 1.    Medications:  Allergies as of 01/18/2022       Reactions   Propoxyphene Anaphylaxis, Shortness Of Breath, Other (See Comments)   Hallucinations   Keflex [cephalexin] Other (See Comments)   Diarrrhea   Nitrofurantoin Diarrhea, Rash   Welts with blisters        Medication List        Accurate as of January 18, 2022  2:04 PM. If you have any questions, ask your nurse or doctor.          amoxicillin-clavulanate 875-125 MG tablet Commonly known as: AUGMENTIN Take 1 tablet by mouth 2 (two) times daily.   Calquence 100 MG Tabs Generic drug: Acalabrutinib Maleate Take 1 tablet (100 mg) by mouth 2 (two) times daily.   clobetasol cream 0.05 % Commonly known as: TEMOVATE Apply 1 application topically 3 (three) times a  week.   FLUoxetine 20 MG capsule Commonly known as: PROZAC TAKE ONE CAPSULE BY MOUTH DAILY   folic acid 1 MG tablet Commonly known as: FOLVITE TAKE TWO TABLETS BY MOUTH DAILY   nitroGLYCERIN 0.4 MG SL tablet Commonly known as: NITROSTAT Place 1 tablet (0.4 mg total) under the tongue every 5 (five) minutes as needed.   rosuvastatin 5 MG tablet Commonly known as: Crestor Take 1 tablet (5 mg total) by mouth at bedtime.   sulfamethoxazole-trimethoprim 800-160 MG tablet Commonly known as: BACTRIM DS Take 1 tablet by mouth 2 (two) times daily.   triamcinolone cream 0.1 % Commonly known as: KENALOG Apply 1 application topically daily. Rash on chest and arm   Venclexta 100 MG tablet Generic drug: venetoclax TAKE 2 TABLETS (200 MG) BY MOUTH DAILY.   vitamin B-12 1000 MCG tablet Commonly known as: CYANOCOBALAMIN Take 1,000 mcg by mouth daily.  Allergies:  Allergies  Allergen Reactions   Propoxyphene Anaphylaxis, Shortness Of Breath and Other (See Comments)    Hallucinations    Keflex [Cephalexin] Other (See Comments)    Diarrrhea   Nitrofurantoin Diarrhea and Rash    Welts with blisters    Past Medical History, Surgical history, Social history, and Family History were reviewed and updated.  Review of Systems: Review of Systems  Constitutional: Negative.   HENT: Negative.    Eyes: Negative.   Respiratory: Negative.    Cardiovascular: Negative.   Gastrointestinal: Negative.   Genitourinary: Negative.   Musculoskeletal: Negative.   Skin: Negative.   Neurological: Negative.   Endo/Heme/Allergies: Negative.   Psychiatric/Behavioral: Negative.      Physical Exam:  vitals were not taken for this visit.   Wt Readings from Last 3 Encounters:  10/18/21 237 lb (107.5 kg)  10/11/21 238 lb (108 kg)  08/23/21 237 lb (107.5 kg)    Physical Exam Vitals reviewed.  HENT:     Head: Normocephalic and atraumatic.  Eyes:     Pupils: Pupils are equal, round, and  reactive to light.  Cardiovascular:     Rate and Rhythm: Normal rate and regular rhythm.     Heart sounds: Normal heart sounds.  Pulmonary:     Effort: Pulmonary effort is normal.     Breath sounds: Normal breath sounds.  Abdominal:     General: Bowel sounds are normal.     Palpations: Abdomen is soft.  Musculoskeletal:        General: No tenderness or deformity. Normal range of motion.     Cervical back: Normal range of motion.  Lymphadenopathy:     Cervical: No cervical adenopathy.  Skin:    General: Skin is warm and dry.     Findings: No erythema or rash.  Neurological:     Mental Status: She is alert and oriented to person, place, and time.  Psychiatric:        Behavior: Behavior normal.        Thought Content: Thought content normal.        Judgment: Judgment normal.     Lab Results  Component Value Date   WBC 5.1 01/18/2022   HGB 12.9 01/18/2022   HCT 37.5 01/18/2022   MCV 98.4 01/18/2022   PLT 135 (L) 01/18/2022   Lab Results  Component Value Date   FERRITIN 25 05/15/2020   IRON 89 05/15/2020   TIBC 344 05/15/2020   UIBC 255 05/15/2020   IRONPCTSAT 26 05/15/2020   Lab Results  Component Value Date   RETICCTPCT 2.5 05/15/2020   RBC 3.81 (L) 01/18/2022   Lab Results  Component Value Date   KPAFRELGTCHN 11.9 10/18/2021   LAMBDASER 2.7 (L) 10/18/2021   KAPLAMBRATIO 4.41 (H) 10/18/2021   Lab Results  Component Value Date   IGGSERUM 438 (L) 10/18/2021   IGA 18 (L) 10/18/2021   IGMSERUM 28 10/18/2021   Lab Results  Component Value Date   TOTALPROTELP 5.5 (L) 10/18/2021   ALBUMINELP 3.5 10/18/2021   A1GS 0.2 10/18/2021   A2GS 0.6 10/18/2021   BETS 0.9 10/18/2021   GAMS 0.4 10/18/2021   MSPIKE Not Observed 10/18/2021     Chemistry      Component Value Date/Time   NA 142 10/18/2021 1134   K 4.1 10/18/2021 1134   CL 108 10/18/2021 1134   CO2 28 10/18/2021 1134   BUN 14 10/18/2021 1134   CREATININE 0.71 10/18/2021 1134   CREATININE 0.69  02/01/2020 1149      Component Value Date/Time   CALCIUM 9.0 10/18/2021 1134   ALKPHOS 68 10/18/2021 1134   AST 16 10/18/2021 1134   ALT 14 10/18/2021 1134   BILITOT 0.6 10/18/2021 1134       Impression and Plan: Sara Sanford is a very pleasant 66 yo caucasian female with CLL.  I think she has relatively decent prognostic markers.  When we see her back, we will do another flow cytometry on the peripheral blood to see everything looks with the CLL.  Her lymphocytes are holding steady right now.  When I looked at her blood on the microscope, I really do not see anything that looked suspicious.  Lymphocytes appeared to be mature.  Hopefully, her knee will improve a little bit.  It sounds like she probably will need to have knee surgery but I think her weight is by going oh hold the Orthopedic surgeons back.  We will plan to get her back in 3 months.    Volanda Napoleon, MD 2/10/20232:04 PM

## 2022-01-21 ENCOUNTER — Telehealth: Payer: Self-pay

## 2022-01-21 NOTE — Telephone Encounter (Signed)
Opened in error

## 2022-01-28 ENCOUNTER — Other Ambulatory Visit: Payer: Self-pay | Admitting: Hematology & Oncology

## 2022-01-28 ENCOUNTER — Other Ambulatory Visit (HOSPITAL_COMMUNITY): Payer: Self-pay

## 2022-01-28 DIAGNOSIS — C9110 Chronic lymphocytic leukemia of B-cell type not having achieved remission: Secondary | ICD-10-CM

## 2022-01-28 MED ORDER — CALQUENCE 100 MG PO TABS
100.0000 mg | ORAL_TABLET | Freq: Two times a day (BID) | ORAL | 3 refills | Status: DC
  Filled 2022-02-07: qty 60, 30d supply, fill #0
  Filled 2022-03-01: qty 60, 30d supply, fill #1
  Filled 2022-03-28: qty 60, 30d supply, fill #2
  Filled 2022-04-26: qty 60, 30d supply, fill #3

## 2022-01-28 MED ORDER — VENETOCLAX 100 MG PO TABS
ORAL_TABLET | ORAL | 3 refills | Status: DC
  Filled 2022-02-07: qty 60, 30d supply, fill #0
  Filled 2022-03-01: qty 60, 30d supply, fill #1
  Filled 2022-03-28: qty 60, 30d supply, fill #2
  Filled 2022-04-26: qty 60, 30d supply, fill #3

## 2022-01-30 ENCOUNTER — Other Ambulatory Visit (HOSPITAL_COMMUNITY): Payer: Self-pay

## 2022-01-31 ENCOUNTER — Other Ambulatory Visit (HOSPITAL_COMMUNITY): Payer: Self-pay

## 2022-02-07 ENCOUNTER — Other Ambulatory Visit (HOSPITAL_COMMUNITY): Payer: Self-pay

## 2022-02-27 ENCOUNTER — Emergency Department (INDEPENDENT_AMBULATORY_CARE_PROVIDER_SITE_OTHER)
Admission: EM | Admit: 2022-02-27 | Discharge: 2022-02-27 | Disposition: A | Discharge status: Home / Self Care | Payer: Medicare Other | Source: Home / Self Care | Attending: Family Medicine | Admitting: Family Medicine

## 2022-02-27 DIAGNOSIS — R300 Dysuria: Secondary | ICD-10-CM | POA: Diagnosis not present

## 2022-02-27 DIAGNOSIS — N390 Urinary tract infection, site not specified: Secondary | ICD-10-CM

## 2022-02-27 LAB — POCT URINALYSIS DIP (MANUAL ENTRY)
Bilirubin, UA: NEGATIVE
Glucose, UA: NEGATIVE mg/dL
Ketones, POC UA: NEGATIVE mg/dL
Nitrite, UA: POSITIVE — AB
Protein Ur, POC: 100 mg/dL — AB
Spec Grav, UA: >=1.03 — AB (ref 1.010–1.025)
Urobilinogen, UA: 1 E.U./dL
pH, UA: 5.5 (ref 5.0–8.0)

## 2022-02-27 MED ORDER — CIPROFLOXACIN HCL 500 MG PO TABS: 500.0000 mg | ORAL_TABLET | Freq: Two times a day (BID) | ORAL | 0 refills | Status: DC

## 2022-02-27 NOTE — ED Provider Notes (Signed)
?[facility] ? ? ? ?CSN: [id number] ?Arrival date & time: 02/27/22  1054 ? ? ?  ? ?History   ?Chief Complaint ?Chief Complaint  ?Patient presents with  ? Dysuria  ? ? ?HPI ?Sara Sanford is a 66 y.o. female.  ? ?HPI ? ?Patient has recurring UTIs.  She states this is happened since 1977 after she had surgery for kidney stones.  She is under the care of Dr. Madilyn Fireman.  Her most recent infection was in December.  I have reviewed her last 4 cultures.  She often has E. coli or Proteus that is resistant to variety of antibiotics.  I am going to choose Cipro as an antibiotic for this reason.  She has taken it many times before without difficulty. ?Currently has dysuria and frequency since yesterday.  Denies flank pain, nausea or vomiting, fever or chills to indicate kidney involvement ? ?Past Medical History:  ?Diagnosis Date  ? Abdominal aortic aneurysm 08/01/2014  ? 3.2 cm seen on [place] 07/2014. No change 02/2018. Repeat in 3 yr.   ? Allergy   ? Anxiety   ? Aortic atherosclerosis ([place]) 08/23/2021  ? Arthritis   ? Cholelithiasis 08/17/2014  ? CLL (chronic lymphocytic leukemia) ([place]) 08/12/2019  ? GAD (generalized anxiety disorder) 02/17/2017  ? GERD 05/22/2009  ? Qualifier: Diagnosis of  By: Esmeralda Arthur    ? Heart burn   ? peptic ulcer  ? Hyperlipidemia 01/20/2020  ? Kidney stones   ? Left leg pain 09/09/2019  ? LEG EDEMA, BILATERAL 05/22/2009  ? Qualifier: Diagnosis of  By: Esmeralda Arthur    ? Lichen sclerosus   ? Macrocytic anemia 09/07/2019  ? OBESITY 05/22/2009  ? Qualifier: Diagnosis of  By: Esmeralda Arthur    ? POLYARTHRITIS 06/19/2010  ? Qualifier: Diagnosis of  By: Esmeralda Arthur    ? Post-menopausal   ? Primary osteoarthritis of left knee 09/09/2019  ? Rash and nonspecific skin eruption 01/03/2020  ? Recurrent UTI 06/30/2019  ? ? ?Patient Active Problem List  ? Diagnosis Date Noted  ? Morbid obesity ([place]) 10/11/2021  ? Chest pain of uncertain etiology 40/81/4481  ? Cardiac murmur 10/11/2021  ? Anxiety 09/26/2021  ?  Heart burn 09/26/2021  ? Kidney stones 09/26/2021  ? Lichen sclerosus 85/63/1497  ? Post-menopausal 09/26/2021  ? Aortic atherosclerosis ([place]) 08/23/2021  ? Hyperlipidemia 01/20/2020  ? Primary osteoarthritis of left knee 09/09/2019  ? Macrocytic anemia 09/07/2019  ? CLL (chronic lymphocytic leukemia) ([place]) 08/12/2019  ? Recurrent UTI 06/30/2019  ? GAD (generalized anxiety disorder) 02/17/2017  ? Cholelithiasis 08/17/2014  ? Abdominal aortic aneurysm 08/01/2014  ? POLYARTHRITIS 06/19/2010  ? OBESITY 05/22/2009  ? GERD 05/22/2009  ? LEG EDEMA, BILATERAL 05/22/2009  ? ? ?Past Surgical History:  ?Procedure Laterality Date  ? CARDIAC CATHETERIZATION  2008  ? CHOLECYSTECTOMY    ? COLONOSCOPY  2010  ? [place], [place]  ? DILATION AND CURETTAGE OF UTERUS    ? ESOPHAGOGASTRODUODENOSCOPY  2005  ? [place], [place] had egd and colon at the same time   ? GALLBLADDER SURGERY  2015  ? HYSTEROSCOPY WITH D & C  2008  ? [place]  ? UPPER GASTROINTESTINAL ENDOSCOPY    ? ? ?OB History   ? ? Gravida  ?4  ? Para  ?3  ? Term  ?   ? Preterm  ?   ? AB  ?1  ? Living  ?   ?  ? ?  SAB  ?1  ? IAB  ?   ? Ectopic  ?   ? Multiple  ?   ? Live Births  ?   ?   ?  ?  ? ? ? ?Home Medications   ? ?Prior to Admission medications   ?Medication Sig Start Date End Date Taking? Authorizing Provider  ?ciprofloxacin (CIPRO) 500 MG tablet Take 1 tablet (500 mg total) by mouth every 12 (twelve) hours. 02/27/22  Yes Raylene Everts, MD  ?Acalabrutinib Maleate (CALQUENCE) 100 MG TABS Take 1 tablet (100 mg) by mouth 2 (two) times daily. 01/28/22   Volanda Napoleon, MD  ?FLUoxetine (PROZAC) 20 MG capsule TAKE ONE CAPSULE BY MOUTH DAILY 12/05/21   Hali Marry, MD  ?folic acid (FOLVITE) 1 MG tablet TAKE TWO TABLETS BY MOUTH DAILY 02/14/21   Volanda Napoleon, MD  ?nitroGLYCERIN (NITROSTAT) 0.4 MG SL tablet Place 1 tablet (0.4 mg total) under the tongue every 5 (five) minutes as needed. 10/11/21 01/09/22  Revankar, Reita Cliche, MD  ?venetoclax  (VENCLEXTA) 100 MG tablet TAKE 2 TABLETS (200 MG) BY MOUTH DAILY. 01/28/22 01/28/23  Volanda Napoleon, MD  ?vitamin B-12 (CYANOCOBALAMIN) 1000 MCG tablet Take 1,000 mcg by mouth daily.    [provider]  ? ? ?Family History ?Family History  ?Problem Relation Age of Onset  ? Breast cancer Paternal Aunt   ?     pm breast CA  ? Breast cancer Paternal Aunt   ?     PM breast CA  ? Heart attack Father   ? Hypertension Father   ? Colon polyps Father   ? Hypertension Mother   ? Stomach cancer Maternal Grandmother   ? Colon cancer Neg Hx   ? Rectal cancer Neg Hx   ? ? ?Social History ?Social History  ? ?Tobacco Use  ? Smoking status: Never  ? Smokeless tobacco: Never  ?Vaping Use  ? Vaping Use: Never used  ?Substance Use Topics  ? Alcohol use: No  ? Drug use: Never  ? ? ? ?Allergies   ?Propoxyphene, Keflex [cephalexin], and Nitrofurantoin ? ? ?Review of Systems ?Review of Systems ?See HPI ? ?Physical Exam ?Triage Vital Signs ?ED Triage Vitals  ?Enc Vitals Group  ?   BP 02/27/22 1059 108/74  ?   Pulse Rate 02/27/22 1059 70  ?   Resp 02/27/22 1059 14  ?   Temp 02/27/22 1059 98 ?F (36.7 ?C)  ?   Temp Source 02/27/22 1059 Oral  ?   SpO2 02/27/22 1059 96 %  ?   Weight --   ?   Height --   ?   Head Circumference --   ?   Peak Flow --   ?   Pain Score 02/27/22 1101 4  ?   Pain Loc --   ?   Pain Edu? --   ?   Excl. in [place]? --   ? ?No data found. ? ?Updated Vital Signs ?BP 108/74 (BP Location: Right Arm)   Pulse 70   Temp 98 ?F (36.7 ?C) (Oral)   Resp 14   SpO2 96%  ? ?   ? ?Physical Exam ?Constitutional:   ?   General: She is not in acute distress. ?   Appearance: She is well-developed.  ?HENT:  ?   Head: Normocephalic and atraumatic.  ?   Mouth/Throat:  ?   Comments: Mask is in place ?Eyes:  ?   Conjunctiva/sclera: Conjunctivae normal.  ?  Pupils: Pupils are equal, round, and reactive to light.  ?Cardiovascular:  ?   Rate and Rhythm: Normal rate.  ?Pulmonary:  ?   Effort: Pulmonary effort is normal. No respiratory  distress.  ?Abdominal:  ?   General: There is no distension.  ?   Palpations: Abdomen is soft.  ?Musculoskeletal:     ?   General: Normal range of motion.  ?   Cervical back: Normal range of motion.  ?Skin: ?   General: Skin is warm and dry.  ?Neurological:  ?   Mental Status: She is alert.  ? ? ? ?UC Treatments / Results  ?Labs ?(all labs ordered are listed, but only abnormal results are displayed) ?Labs Reviewed  ?POCT URINALYSIS DIP (MANUAL ENTRY) - Abnormal; Notable for the following components:  ?    Result Value  ? Color, UA orange (*)   ? Clarity, UA cloudy (*)   ? Spec Grav, UA >=1.030 (*)   ? Blood, UA moderate (*)   ? Protein Ur, POC =100 (*)   ? Nitrite, UA Positive (*)   ? Leukocytes, UA Small (1+) (*)   ? All other components within normal limits  ?URINE CULTURE  ? ? ?EKG ? ? ?Radiology ?No results found. ? ?Procedures ?Procedures (including critical care time) ? ?Medications Ordered in UC ?Medications - No data to display ? ?Initial Impression / Assessment and Plan / UC Course  ?I have reviewed the triage vital signs and the nursing notes. ? ?Pertinent labs & imaging results that were available during my care of the patient were reviewed by me and considered in my medical decision making (see chart for details). ? ?  ? ?Final Clinical Impressions(s) / UC Diagnoses  ? ?Final diagnoses:  ?Dysuria  ?Recurrent UTI  ? ? ? ?Discharge Instructions   ? ?  ?Take the antibiotic 2 times a day for 5 days ?Drink plenty of fluids ?You can check your culture report in 2 to 3 days in MyChart ?Call for problems ? ? ?ED Prescriptions   ? ? Medication Sig Dispense Auth. Provider  ? ciprofloxacin (CIPRO) 500 MG tablet Take 1 tablet (500 mg total) by mouth every 12 (twelve) hours. 10 tablet Raylene Everts, MD  ? ?  ? ?PDMP not reviewed this encounter. ?  ?Raylene Everts, MD ?02/27/22 1127 ? ?

## 2022-02-27 NOTE — ED Triage Notes (Signed)
Pt presents with urinary frequency and dysuria that began last night. ?

## 2022-02-27 NOTE — Discharge Instructions (Signed)
Take the antibiotic 2 times a day for 5 days ?Drink plenty of fluids ?You can check your culture report in 2 to 3 days in MyChart ?Call for problems ?

## 2022-03-01 ENCOUNTER — Other Ambulatory Visit (HOSPITAL_COMMUNITY): Payer: Self-pay

## 2022-03-01 LAB — URINE CULTURE
MICRO NUMBER:: 13164774
SPECIMEN QUALITY:: ADEQUATE

## 2022-03-04 ENCOUNTER — Other Ambulatory Visit (HOSPITAL_COMMUNITY): Payer: Self-pay

## 2022-03-08 ENCOUNTER — Other Ambulatory Visit: Payer: Self-pay | Admitting: Hematology & Oncology

## 2022-03-26 ENCOUNTER — Other Ambulatory Visit (HOSPITAL_COMMUNITY): Payer: Self-pay

## 2022-03-28 ENCOUNTER — Other Ambulatory Visit (HOSPITAL_COMMUNITY): Payer: Self-pay

## 2022-04-04 ENCOUNTER — Other Ambulatory Visit (HOSPITAL_COMMUNITY): Payer: Self-pay

## 2022-04-23 ENCOUNTER — Ambulatory Visit: Payer: Medicare Other | Admitting: Cardiology

## 2022-04-25 ENCOUNTER — Encounter: Payer: Self-pay | Admitting: Hematology & Oncology

## 2022-04-25 ENCOUNTER — Inpatient Hospital Stay (HOSPITAL_BASED_OUTPATIENT_CLINIC_OR_DEPARTMENT_OTHER): Payer: Medicare Other | Admitting: Hematology & Oncology

## 2022-04-25 ENCOUNTER — Inpatient Hospital Stay: Payer: Medicare Other | Attending: Family

## 2022-04-25 ENCOUNTER — Other Ambulatory Visit: Payer: Self-pay

## 2022-04-25 VITALS — BP 90/41 | HR 53 | Temp 97.9°F | Resp 17 | Wt 234.0 lb

## 2022-04-25 DIAGNOSIS — R197 Diarrhea, unspecified: Secondary | ICD-10-CM | POA: Insufficient documentation

## 2022-04-25 DIAGNOSIS — C9110 Chronic lymphocytic leukemia of B-cell type not having achieved remission: Secondary | ICD-10-CM | POA: Diagnosis present

## 2022-04-25 DIAGNOSIS — Z7969 Long term (current) use of other immunomodulators and immunosuppressants: Secondary | ICD-10-CM | POA: Diagnosis not present

## 2022-04-25 DIAGNOSIS — Z881 Allergy status to other antibiotic agents status: Secondary | ICD-10-CM | POA: Insufficient documentation

## 2022-04-25 DIAGNOSIS — R1110 Vomiting, unspecified: Secondary | ICD-10-CM | POA: Insufficient documentation

## 2022-04-25 DIAGNOSIS — K8020 Calculus of gallbladder without cholecystitis without obstruction: Secondary | ICD-10-CM | POA: Diagnosis not present

## 2022-04-25 DIAGNOSIS — Z79899 Other long term (current) drug therapy: Secondary | ICD-10-CM | POA: Diagnosis not present

## 2022-04-25 LAB — CBC WITH DIFFERENTIAL (CANCER CENTER ONLY)
Abs Immature Granulocytes: 0.02 10*3/uL (ref 0.00–0.07)
Basophils Absolute: 0 10*3/uL (ref 0.0–0.1)
Basophils Relative: 0 %
Eosinophils Absolute: 0 10*3/uL (ref 0.0–0.5)
Eosinophils Relative: 0 %
HCT: 37.1 % (ref 36.0–46.0)
Hemoglobin: 12.7 g/dL (ref 12.0–15.0)
Immature Granulocytes: 0 %
Lymphocytes Relative: 35 %
Lymphs Abs: 1.6 10*3/uL (ref 0.7–4.0)
MCH: 33.7 pg (ref 26.0–34.0)
MCHC: 34.2 g/dL (ref 30.0–36.0)
MCV: 98.4 fL (ref 80.0–100.0)
Monocytes Absolute: 0.5 10*3/uL (ref 0.1–1.0)
Monocytes Relative: 10 %
Neutro Abs: 2.5 10*3/uL (ref 1.7–7.7)
Neutrophils Relative %: 55 %
Platelet Count: 137 10*3/uL — ABNORMAL LOW (ref 150–400)
RBC: 3.77 MIL/uL — ABNORMAL LOW (ref 3.87–5.11)
RDW: 12.5 % (ref 11.5–15.5)
WBC Count: 4.6 10*3/uL (ref 4.0–10.5)
nRBC: 0 % (ref 0.0–0.2)

## 2022-04-25 LAB — CMP (CANCER CENTER ONLY)
ALT: 14 U/L (ref 0–44)
AST: 16 U/L (ref 15–41)
Albumin: 4.1 g/dL (ref 3.5–5.0)
Alkaline Phosphatase: 61 U/L (ref 38–126)
Anion gap: 6 (ref 5–15)
BUN: 14 mg/dL (ref 8–23)
CO2: 29 mmol/L (ref 22–32)
Calcium: 9.1 mg/dL (ref 8.9–10.3)
Chloride: 106 mmol/L (ref 98–111)
Creatinine: 0.75 mg/dL (ref 0.44–1.00)
GFR, Estimated: >60 mL/min (ref >60)
Glucose, Bld: 96 mg/dL (ref 70–99)
Potassium: 4.4 mmol/L (ref 3.5–5.1)
Sodium: 141 mmol/L (ref 135–145)
Total Bilirubin: 0.6 mg/dL (ref 0.3–1.2)
Total Protein: 6.1 g/dL — ABNORMAL LOW (ref 6.5–8.1)

## 2022-04-25 LAB — SAVE SMEAR(SSMR), FOR PROVIDER SLIDE REVIEW

## 2022-04-25 NOTE — Progress Notes (Signed)
Hematology and Oncology Follow Up Visit  Sara Sanford [id number] 09-22-1956 66 y.o. 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  She seems to be doing okay.  Her problem continues to be some diarrhea.  A lot of this is from the [place] or the venetoclax.  She takes Imodium which seems to help for a couple days.  She has had no problems with fever.  There is been occasional bouts of vomiting.  She has had no bleeding..  She has had some discomfort over on the right side of her abdomen.  I do not know if this might be gallbladder issues.  It may not be a bad idea to have this checked.  She has had no rashes.  There is been no leg swelling.  Overall, I would have to say that her performance status is probably ECOG 1.    Medications:  Allergies as of 04/25/2022       Reactions   Propoxyphene Anaphylaxis, Shortness Of Breath, Other (See Comments)   Hallucinations   Keflex [cephalexin] Other (See Comments)   Diarrrhea   Nitrofurantoin Diarrhea, Rash   Welts with blisters        Medication List        Accurate as of Apr 25, 2022  2:15 PM. If you have any questions, ask your nurse or doctor.          STOP taking these medications    ciprofloxacin 500 MG tablet Commonly known as: CIPRO Stopped by: Volanda Napoleon, MD       TAKE these medications    Calquence 100 MG Tabs Generic drug: Acalabrutinib Maleate Take 1 tablet (100 mg) by mouth 2 (two) times daily.   FLUoxetine 20 MG  capsule Commonly known as: PROZAC TAKE ONE CAPSULE BY MOUTH DAILY   folic acid 1 MG tablet Commonly known as: FOLVITE TAKE TWO TABLETS BY MOUTH DAILY   nitroGLYCERIN 0.4 MG SL tablet Commonly known as: NITROSTAT Place 1 tablet (0.4 mg total) under the tongue every 5 (five) minutes as needed.   Venclexta 100 MG tablet Generic drug: venetoclax TAKE 2 TABLETS (200 MG) BY MOUTH DAILY.   vitamin B-12 1000 MCG tablet Commonly known as: CYANOCOBALAMIN Take 1,000 mcg by mouth daily.        Allergies:  Allergies  Allergen Reactions   Propoxyphene Anaphylaxis, Shortness Of Breath and Other (See Comments)    Hallucinations    Keflex [Cephalexin] Other (See Comments)    Diarrrhea   Nitrofurantoin Diarrhea and Rash    Welts with  blisters    Past Medical History, Surgical history, Social history, and Family History were reviewed and updated.  Review of Systems: Review of Systems  Constitutional: Negative.   HENT: Negative.    Eyes: Negative.   Respiratory: Negative.    Cardiovascular: Negative.   Gastrointestinal: Negative.   Genitourinary: Negative.   Musculoskeletal: Negative.   Skin: Negative.   Neurological: Negative.   Endo/Heme/Allergies: Negative.   Psychiatric/Behavioral: Negative.      Physical Exam:  weight is 234 lb (106.1 kg). Her oral temperature is 97.9 F (36.6 C). Her blood pressure is 90/41 (abnormal) and her pulse is 53 (abnormal). Her respiration is 17 and oxygen saturation is 100%.   Wt Readings from Last 3 Encounters:  04/25/22 234 lb (106.1 kg)  01/18/22 236 lb (107 kg)  10/18/21 237 lb (107.5 kg)    Physical Exam Vitals reviewed.  HENT:     Head: Normocephalic and atraumatic.  Eyes:     Pupils: Pupils are equal, round, and reactive to light.  Cardiovascular:     Rate and Rhythm: Normal rate and regular rhythm.     Heart sounds: Normal heart sounds.  Pulmonary:     Effort: Pulmonary effort is normal.     Breath sounds: Normal breath  sounds.  Abdominal:     General: Bowel sounds are normal.     Palpations: Abdomen is soft.  Musculoskeletal:        General: No tenderness or deformity. Normal range of motion.     Cervical back: Normal range of motion.  Lymphadenopathy:     Cervical: No cervical adenopathy.  Skin:    General: Skin is warm and dry.     Findings: No erythema or rash.  Neurological:     Mental Status: She is alert and oriented to person, place, and time.  Psychiatric:        Behavior: Behavior normal.        Thought Content: Thought content normal.        Judgment: Judgment normal.     Lab Results  Component Value Date   WBC 4.6 04/25/2022   HGB 12.7 04/25/2022   HCT 37.1 04/25/2022   MCV 98.4 04/25/2022   PLT 137 (L) 04/25/2022   Lab Results  Component Value Date   FERRITIN 25 05/15/2020   IRON 89 05/15/2020   TIBC 344 05/15/2020   UIBC 255 05/15/2020   IRONPCTSAT 26 05/15/2020   Lab Results  Component Value Date   RETICCTPCT 2.5 05/15/2020   RBC 3.77 (L) 04/25/2022   Lab Results  Component Value Date   KPAFRELGTCHN 11.9 10/18/2021   LAMBDASER 2.7 (L) 10/18/2021   KAPLAMBRATIO 4.41 (H) 10/18/2021   Lab Results  Component Value Date   IGGSERUM 438 (L) 10/18/2021   IGA 18 (L) 10/18/2021   IGMSERUM 28 10/18/2021   Lab Results  Component Value Date   TOTALPROTELP 5.5 (L) 10/18/2021   ALBUMINELP 3.5 10/18/2021   A1GS 0.2 10/18/2021   A2GS 0.6 10/18/2021   BETS 0.9 10/18/2021   GAMS 0.4 10/18/2021   MSPIKE Not Observed 10/18/2021     Chemistry      Component Value Date/Time   NA 141 04/25/2022 1324   K 4.4 04/25/2022 1324   CL 106 04/25/2022 1324   CO2 29 04/25/2022 1324   BUN 14 04/25/2022 1324   CREATININE 0.75 04/25/2022 1324   CREATININE 0.69 02/01/2020 1149      Component Value Date/Time   CALCIUM 9.1 04/25/2022 1324  ALKPHOS 61 04/25/2022 1324   AST 16 04/25/2022 1324   ALT 14 04/25/2022 1324   BILITOT 0.6 04/25/2022 1324       Impression and Plan:  Sara Sanford is a very pleasant 66 yo caucasian female with CLL.  I think she has relatively decent prognostic markers.  I just had the fact that she has this diarrhea and vomiting.  Again we will check her gallbladder.  We will see how that looks.  I do think that we would probably need to get her set up with a bone marrow test.  I think for going to think about stopping the Calquence/venetoclax, then a bone marrow is definitely indicated.  I talked her about this.  We will try to get this set up in July.  She does have problems with her knees.  I am sure she probably needs to have knee surgery.  I am unsure what this is going to be in the future for her.  We will plan to get her back probably in another 3 months.  Again we will see about that bone marrow test.  I think this will be helpful.  This will really help [place] determine whether or not we can stop treatment on her.    Volanda Napoleon, MD 5/18/20232:15 PM

## 2022-04-26 ENCOUNTER — Other Ambulatory Visit (HOSPITAL_COMMUNITY): Payer: Self-pay

## 2022-04-29 ENCOUNTER — Other Ambulatory Visit (HOSPITAL_COMMUNITY): Payer: Self-pay

## 2022-04-29 ENCOUNTER — Encounter (HOSPITAL_COMMUNITY): Payer: Self-pay | Admitting: Hematology & Oncology

## 2022-04-30 LAB — FLOW CYTOMETRY

## 2022-05-02 ENCOUNTER — Other Ambulatory Visit (HOSPITAL_COMMUNITY): Payer: Self-pay

## 2022-05-08 ENCOUNTER — Other Ambulatory Visit (HOSPITAL_COMMUNITY): Payer: Self-pay

## 2022-05-10 ENCOUNTER — Other Ambulatory Visit (HOSPITAL_COMMUNITY): Payer: Self-pay

## 2022-05-10 ENCOUNTER — Telehealth: Payer: Self-pay | Admitting: Pharmacy Technician

## 2022-05-10 NOTE — Telephone Encounter (Signed)
Oral Oncology Patient Advocate Encounter  Submitted application online to [employer] to determine if patient qualifies to receive Calquence from the manufacturer at $0 cost.  Application is approved from 05/10/22-12/08/22  [facility] is the dispensing pharmacy for [employer].  [employer] patient assistance phone number for follow up is [phone].   [place] Patient [place] Phone [phone] Fax [phone] 05/10/2022 10:20 AM

## 2022-05-13 ENCOUNTER — Other Ambulatory Visit: Payer: Self-pay | Admitting: *Deleted

## 2022-05-13 DIAGNOSIS — C9110 Chronic lymphocytic leukemia of B-cell type not having achieved remission: Secondary | ICD-10-CM

## 2022-05-13 MED ORDER — CALQUENCE 100 MG PO TABS: 100.0000 mg | ORAL_TABLET | Freq: Two times a day (BID) | ORAL | 11 refills | Status: DC

## 2022-05-16 ENCOUNTER — Telehealth: Payer: Self-pay | Admitting: Pharmacy Technician

## 2022-05-16 ENCOUNTER — Other Ambulatory Visit (HOSPITAL_COMMUNITY): Payer: Self-pay

## 2022-05-16 NOTE — Telephone Encounter (Signed)
Oral Oncology Patient Advocate Encounter   Was successful in securing patient a $10,000 grant from [employer] to provide copayment coverage for Sara Sanford.  This will keep the out of pocket expense at $0.    The billing information is as follows and has been shared with [name].   Member ID: [id number] Group ID: [id number]  RxBin: [id number] Dates of Eligibility: 05/16/22 through 05/17/23  Fund:  [name] Patient [name] Phone [phone] Fax [phone] 05/16/2022 2:23 PM

## 2022-06-03 ENCOUNTER — Other Ambulatory Visit: Payer: Self-pay | Admitting: Family Medicine

## 2022-06-30 ENCOUNTER — Other Ambulatory Visit: Payer: Self-pay | Admitting: Radiology

## 2022-06-30 DIAGNOSIS — C9110 Chronic lymphocytic leukemia of B-cell type not having achieved remission: Secondary | ICD-10-CM

## 2022-07-01 ENCOUNTER — Other Ambulatory Visit: Payer: Self-pay | Admitting: Internal Medicine

## 2022-07-01 NOTE — H&P (Signed)
Chief Complaint: Patient was seen in consultation today for chronic lymphocytic leukemia at the request of Ennever,Peter R  Referring Physician(s): Ennever,Peter R  Supervising Physician: Mir, Mauri Reading  Patient Status: Sara Sanford - Out-pt  History of Present Illness: Sara Sanford is a 66 y.o. female with past medical history of AAA, anxiety, aortic atherosclerosis, arthritis, cholelithiasis, CLL, GAD, GERD, HLD, urinary calculi, bilateral lower extremity edema, lichen sclerosis, macrocytic anemia, rash and recurrent UTI.  Patient has been followed and treated by oncology for CLL diagnosed September 2020.  Patient has been referred to IR for bone marrow biopsy and aspiration to evaluate response to treatment by Arlan Organ, MD.  Past Medical History:  Diagnosis Date   Abdominal aortic aneurysm (HCC) 08/01/2014   3.2 cm seen on [place] 07/2014. No change 02/2018. Repeat in 3 yr.    Allergy    Anxiety    Aortic atherosclerosis (HCC) 08/23/2021   Arthritis    Cholelithiasis 08/17/2014   CLL (chronic lymphocytic leukemia) (HCC) 08/12/2019   GAD (generalized anxiety disorder) 02/17/2017   GERD 05/22/2009   Qualifier: Diagnosis of  By: Cathey Endow DO, Karen     Heart burn    peptic ulcer   Hyperlipidemia 01/20/2020   Kidney stones    Left leg pain 09/09/2019   LEG EDEMA, BILATERAL 05/22/2009   Qualifier: Diagnosis of  By: Cathey Endow DO, Karen     Lichen sclerosus    Macrocytic anemia 09/07/2019   OBESITY 05/22/2009   Qualifier: Diagnosis of  By: Thomos Lemons     POLYARTHRITIS 06/19/2010   Qualifier: Diagnosis of  By: Cathey Endow DO, Karen     Post-menopausal    Primary osteoarthritis of left knee 09/09/2019   Rash and nonspecific skin eruption 01/03/2020   Recurrent UTI 06/30/2019    Past Surgical History:  Procedure Laterality Date   CARDIAC CATHETERIZATION  2008   CHOLECYSTECTOMY     COLONOSCOPY  2010   [place]   DILATION AND CURETTAGE OF UTERUS     ESOPHAGOGASTRODUODENOSCOPY  2005    [place], [place] had egd and colon at the same time    GALLBLADDER SURGERY  2015   HYSTEROSCOPY WITH D & C  2008   KIDNEY STONE SURGERY  1977   UPPER GASTROINTESTINAL ENDOSCOPY      Allergies: Propoxyphene, Keflex [cephalexin], and Nitrofurantoin  Medications: Prior to Admission medications   Medication Sig Start Date End Date Taking? Authorizing Provider  Acalabrutinib Maleate (CALQUENCE) 100 MG TABS Take 1 tablet (100 mg) by mouth 2 (two) times daily. 05/13/22   Josph Macho, MD  FLUoxetine (PROZAC) 20 MG capsule TAKE ONE CAPSULE BY MOUTH DAILY 06/03/22   Agapito Games, MD  folic acid (FOLVITE) 1 MG tablet TAKE TWO TABLETS BY MOUTH DAILY 03/08/22   Josph Macho, MD  nitroGLYCERIN (NITROSTAT) 0.4 MG SL tablet Place 1 tablet (0.4 mg total) under the tongue every 5 (five) minutes as needed. 10/11/21 01/09/22  Revankar, Aundra Dubin, MD  venetoclax (VENCLEXTA) 100 MG tablet TAKE 2 TABLETS (200 MG) BY MOUTH DAILY. 01/28/22 01/28/23  Josph Macho, MD  vitamin B-12 (CYANOCOBALAMIN) 1000 MCG tablet Take 1,000 mcg by mouth daily.    [provider]     Family History  Problem Relation Age of Onset   Breast cancer Paternal Aunt        pm breast CA   Breast cancer Paternal Aunt        PM breast CA   Heart attack Father  Hypertension Father    Colon polyps Father    Hypertension Mother    Stomach cancer Maternal Grandmother    Colon cancer Neg Hx    Rectal cancer Neg Hx     Social History   Socioeconomic History   Marital status: Married    Spouse name: Nellia Buckman   Number of children: 2   Years of education: 12th grade   Highest education level: 12th grade  Occupational History   Occupation: Retail buyer: Lawyer   Occupation: Retired  Tobacco Use   Smoking status: Never   Smokeless tobacco: Never  Building services engineer Use: Never used  Substance and Sexual Activity   Alcohol use: No   Drug use: Never   Sexual activity: Yes    Partners:  Male    Birth control/protection: Post-menopausal  Other Topics Concern   Not on file  Social History Narrative   Lives with her husband. She has two children. She enjoys sewing and quilting.    Social Determinants of Health   Financial Resource Strain: Low Risk  (07/23/2021)   Overall Financial Resource Strain (CARDIA)    Difficulty of Paying Living Expenses: Not hard at all  Food Insecurity: No Food Insecurity (07/23/2021)   Hunger Vital Sign    Worried About Running Out of Food in the Last Year: Never true    Ran Out of Food in the Last Year: Never true  Transportation Needs: No Transportation Needs (07/23/2021)   PRAPARE - Administrator, Civil Service (Medical): No    Lack of Transportation (Non-Medical): No  Physical Activity: Inactive (07/23/2021)   Exercise Vital Sign    Days of Exercise per Week: 0 days    Minutes of Exercise per Session: 0 min  Stress: No Stress Concern Present (07/23/2021)   [employer] of Occupational Health - Occupational Stress Questionnaire    Feeling of Stress : Not at all  Social Connections: Moderately Integrated (07/23/2021)   Social Connection and Isolation Panel [NHANES]    Frequency of Communication with Friends and Family: More than three times a week    Frequency of Social Gatherings with Friends and Family: Once a week    Attends Religious Services: Never    Database administrator or Organizations: Yes    Attends Engineer, structural: More than 4 times per year    Marital Status: Married    Review of Systems: A 12 point ROS discussed and pertinent positives are indicated in the HPI above.  All other systems are negative.  Review of Systems  All other systems reviewed and are negative.   Vital Signs: BP 117/62   Pulse (!) 54   Temp 98.5 F (36.9 C) (Oral)   Resp 18   SpO2 100%      Physical Exam Vitals reviewed.  Constitutional:      General: She is not in acute distress.    Appearance: Normal  appearance. She is not ill-appearing.  HENT:     Head: Normocephalic and atraumatic.     Mouth/Throat:     Mouth: Mucous membranes are dry.  Eyes:     Extraocular Movements: Extraocular movements intact.     Pupils: Pupils are equal, round, and reactive to light.  Cardiovascular:     Rate and Rhythm: Regular rhythm. Bradycardia present.     Pulses: Normal pulses.     Heart sounds: Normal heart sounds.  Pulmonary:     Effort:  Pulmonary effort is normal. No respiratory distress.     Breath sounds: Normal breath sounds.  Abdominal:     General: Bowel sounds are normal. There is no distension.     Palpations: Abdomen is soft.     Tenderness: There is no abdominal tenderness. There is no guarding.  Musculoskeletal:     Right lower leg: Edema present.     Left lower leg: Edema present.  Skin:    General: Skin is warm and dry.  Neurological:     Mental Status: She is alert and oriented to person, place, and time.  Psychiatric:        Mood and Affect: Mood normal.        Behavior: Behavior normal.        Thought Content: Thought content normal.        Judgment: Judgment normal.     Imaging: No results found.  Labs:  CBC: Recent Labs    07/16/21 1028 10/18/21 1134 01/18/22 1339 04/25/22 1324  WBC 5.3 4.6 5.1 4.6  HGB 13.3 12.7 12.9 12.7  HCT 38.5 37.1 37.5 37.1  PLT 124* 140* 135* 137*    COAGS: No results for input(s): "INR", "APTT" in the last 8760 hours.  BMP: Recent Labs    07/16/21 1028 10/18/21 1134 01/18/22 1339 04/25/22 1324  NA 142 142 141 141  K 3.9 4.1 4.7 4.4  CL 105 108 106 106  CO2 28 28 28 29   GLUCOSE 87 92 86 96  BUN 12 14 14 14   CALCIUM 9.1 9.0 8.7* 9.1  CREATININE 0.72 0.71 0.75 0.75  GFRNONAA >60 >60 >60 >60    LIVER FUNCTION TESTS: Recent Labs    07/16/21 1028 10/18/21 1134 01/18/22 1339 04/25/22 1324  BILITOT 0.8 0.6 0.7 0.6  AST 17 16 18 16   ALT 14 14 16 14   ALKPHOS 66 68 65 61  PROT 5.9* 5.8* 5.9* 6.1*  ALBUMIN 4.2 4.2  3.9 4.1    TUMOR MARKERS: No results for input(s): "AFPTM", "CEA", "CA199", "CHROMGRNA" in the last 8760 hours.  Assessment and Plan: History of AAA, anxiety, aortic atherosclerosis, arthritis, cholelithiasis, CLL, GAD, GERD, HLD, urinary calculi, bilateral lower extremity edema, lichen sclerosis, macrocytic anemia, rash and recurrent UTI.  Patient has been followed and treated by oncology for CLL diagnosed September 2020.  Patient has been referred to IR for bone marrow biopsy and aspiration to evaluate response to treatment by Arlan Organ, MD.   Pt resting on stretcher.   She is A&O, calm and pleasant.  She is in no distress.   Pt is NPO per order.   Risks and benefits of bone marrow biopsy and aspiration with moderate sedation was discussed with the patient and/or patient's family including, but not limited to bleeding, infection, damage to adjacent structures or low yield requiring additional tests.  All of the questions were answered and there is agreement to proceed.  Consent signed and in chart.   Thank you for this interesting consult.  I greatly enjoyed meeting DANAISA GUYETTE and look forward to participating in their care.  A copy of this report was sent to the requesting provider on this date.  Electronically Signed: Shon Hough, NP 07/02/2022, 9:40 AM   I spent a total of 20 minutes in face to face in clinical consultation, greater than 50% of which was counseling/coordinating care for chronic lymphocytic leukemia.

## 2022-07-02 ENCOUNTER — Ambulatory Visit (HOSPITAL_COMMUNITY)
Admission: RE | Admit: 2022-07-02 | Discharge: 2022-07-02 | Disposition: A | Discharge status: Home / Self Care | Payer: Medicare Other | Source: Ambulatory Visit | Attending: Hematology & Oncology | Admitting: Hematology & Oncology

## 2022-07-02 ENCOUNTER — Encounter (HOSPITAL_COMMUNITY): Payer: Self-pay

## 2022-07-02 ENCOUNTER — Other Ambulatory Visit: Payer: Self-pay

## 2022-07-02 DIAGNOSIS — D696 Thrombocytopenia, unspecified: Secondary | ICD-10-CM | POA: Insufficient documentation

## 2022-07-02 DIAGNOSIS — I700 Atherosclerosis of aorta: Secondary | ICD-10-CM | POA: Diagnosis not present

## 2022-07-02 DIAGNOSIS — Z1379 Encounter for other screening for genetic and chromosomal anomalies: Secondary | ICD-10-CM | POA: Diagnosis not present

## 2022-07-02 DIAGNOSIS — E785 Hyperlipidemia, unspecified: Secondary | ICD-10-CM | POA: Insufficient documentation

## 2022-07-02 DIAGNOSIS — D539 Nutritional anemia, unspecified: Secondary | ICD-10-CM | POA: Insufficient documentation

## 2022-07-02 DIAGNOSIS — L900 Lichen sclerosus et atrophicus: Secondary | ICD-10-CM | POA: Insufficient documentation

## 2022-07-02 DIAGNOSIS — C9110 Chronic lymphocytic leukemia of B-cell type not having achieved remission: Secondary | ICD-10-CM | POA: Diagnosis present

## 2022-07-02 DIAGNOSIS — Z87440 Personal history of urinary (tract) infections: Secondary | ICD-10-CM | POA: Diagnosis not present

## 2022-07-02 DIAGNOSIS — F411 Generalized anxiety disorder: Secondary | ICD-10-CM | POA: Diagnosis not present

## 2022-07-02 DIAGNOSIS — I714 Abdominal aortic aneurysm, without rupture, unspecified: Secondary | ICD-10-CM | POA: Insufficient documentation

## 2022-07-02 LAB — CBC WITH DIFFERENTIAL/PLATELET
Abs Immature Granulocytes: 0.01 K/uL (ref 0.00–0.07)
Basophils Absolute: 0 K/uL (ref 0.0–0.1)
Basophils Relative: 0 %
Eosinophils Absolute: 0 K/uL (ref 0.0–0.5)
Eosinophils Relative: 0 %
HCT: 36.8 % (ref 36.0–46.0)
Hemoglobin: 12.6 g/dL (ref 12.0–15.0)
Immature Granulocytes: 0 %
Lymphocytes Relative: 36 %
Lymphs Abs: 1.6 K/uL (ref 0.7–4.0)
MCH: 34 pg (ref 26.0–34.0)
MCHC: 34.2 g/dL (ref 30.0–36.0)
MCV: 99.2 fL (ref 80.0–100.0)
Monocytes Absolute: 0.5 K/uL (ref 0.1–1.0)
Monocytes Relative: 10 %
Neutro Abs: 2.4 K/uL (ref 1.7–7.7)
Neutrophils Relative %: 54 %
Platelets: 130 K/uL — ABNORMAL LOW (ref 150–400)
RBC: 3.71 MIL/uL — ABNORMAL LOW (ref 3.87–5.11)
RDW: 12.7 % (ref 11.5–15.5)
WBC: 4.4 K/uL (ref 4.0–10.5)
nRBC: 0 % (ref 0.0–0.2)

## 2022-07-02 MED ORDER — MIDAZOLAM HCL 2 MG/2ML IJ SOLN
INTRAMUSCULAR | Status: AC | PRN
  Administered 2022-07-02: 1 mg via INTRAVENOUS

## 2022-07-02 MED ORDER — FENTANYL CITRATE (PF) 100 MCG/2ML IJ SOLN
INTRAMUSCULAR | Status: AC
  Filled 2022-07-02: qty 2

## 2022-07-02 MED ORDER — MIDAZOLAM HCL 2 MG/2ML IJ SOLN
INTRAMUSCULAR | Status: AC
  Filled 2022-07-02: qty 2

## 2022-07-02 MED ORDER — FLUMAZENIL 0.5 MG/5ML IV SOLN
INTRAVENOUS | Status: AC
  Filled 2022-07-02: qty 5

## 2022-07-02 MED ORDER — NALOXONE HCL 0.4 MG/ML IJ SOLN
INTRAMUSCULAR | Status: AC
  Filled 2022-07-02: qty 1

## 2022-07-02 MED ORDER — FENTANYL CITRATE (PF) 100 MCG/2ML IJ SOLN
INTRAMUSCULAR | Status: AC | PRN
  Administered 2022-07-02: 50 ug via INTRAVENOUS

## 2022-07-02 MED ORDER — SODIUM CHLORIDE 0.9 % IV SOLN: INTRAVENOUS | Status: DC

## 2022-07-02 NOTE — Procedures (Signed)
Interventional Radiology Procedure Note  Procedure: Bone marrow biopsy  Indication: Lymphocytic Leukemia   Findings: Please refer to procedural dictation for full description.  Complications: None  EBL: < 10 mL  Miachel Roux, MD [phone]

## 2022-07-02 NOTE — Discharge Instructions (Addendum)
Please call Interventional Radiology clinic [phone] with any questions or concerns.  You may remove your dressing and shower tomorrow.   Bone Marrow Aspiration and Bone Marrow Biopsy, Adult, Care After This sheet gives you information about how to care for yourself after your procedure. Your health care provider may also give you more specific instructions. If you have problems or questions, contact your health careprovider. What can I expect after the procedure? After the procedure, it is common to have: Mild pain and tenderness. Swelling. Bruising. Follow these instructions at home: Puncture site care Follow instructions from your health care provider about how to take care of the puncture site. Make sure you: Wash your hands with soap and water before and after you change your bandage (dressing). If soap and water are not available, use hand sanitizer. Change your dressing as told by your health care provider. Check your puncture site every day for signs of infection. Check for: More redness, swelling, or pain. Fluid or blood. Warmth. Pus or a bad smell.  Activity Return to your normal activities as told by your health care provider. Ask your health care provider what activities are safe for you. Do not lift anything that is heavier than 10 lb (4.5 kg), or the limit that you are told, until your health care provider says that it is safe. Do not drive for 24 hours if you were given a sedative during your procedure. General instructions Take over-the-counter and prescription medicines only as told by your health care provider. Do not take baths, swim, or use a hot tub until your health care provider approves. Ask your health care provider if you may take showers. You may only be allowed to take sponge baths. If directed, put ice on the affected area. To do this: Put ice in a plastic bag. Place a towel between your skin and the bag. Leave the ice on for 20 minutes, 2-3 times a  day. Keep all follow-up visits as told by your health care provider. This is important.  Contact a health care provider if: Your pain is not controlled with medicine. You have a fever. You have more redness, swelling, or pain around the puncture site. You have fluid or blood coming from the puncture site. Your puncture site feels warm to the touch. You have pus or a bad smell coming from the puncture site. Summary After the procedure, it is common to have mild pain, tenderness, swelling, and bruising. Follow instructions from your health care provider about how to take care of the puncture site and what activities are safe for you. Take over-the-counter and prescription medicines only as told by your health care provider. Contact a health care provider if you have any signs of infection, such as fluid or blood coming from the puncture site. This information is not intended to replace advice given to you by your health care provider. Make sure you discuss any questions you have with your healthcare provider.   Moderate Conscious Sedation, Adult, Care After This sheet gives you information about how to care for yourself after your procedure. Your health care provider may also give you more specific instructions. If you have problems or questions, contact your health careprovider. What can I expect after the procedure? After the procedure, it is common to have: Sleepiness for several hours. Impaired judgment for several hours. Difficulty with balance. Vomiting if you eat too soon. Follow these instructions at home: For the time period you were told by your health care provider:  Rest. Do not participate in activities where you could fall or become injured. Do not drive or use machinery. Do not drink alcohol. Do not take sleeping pills or medicines that cause drowsiness. Do not make important decisions or sign legal documents. Do not take care of children on your own. Eating and  drinking  Follow the diet recommended by your health care provider. Drink enough fluid to keep your urine pale yellow. If you vomit: Drink water, juice, or soup when you can drink without vomiting. Make sure you have little or no nausea before eating solid foods.  General instructions Take over-the-counter and prescription medicines only as told by your health care provider. Have a responsible adult stay with you for the time you are told. It is important to have someone help care for you until you are awake and alert. Do not smoke. Keep all follow-up visits as told by your health care provider. This is important. Contact a health care provider if: You are still sleepy or having trouble with balance after 24 hours. You feel light-headed. You keep feeling nauseous or you keep vomiting. You develop a rash. You have a fever. You have redness or swelling around the IV site. Get help right away if: You have trouble breathing. You have new-onset confusion at home. Summary After the procedure, it is common to feel sleepy, have impaired judgment, or feel nauseous if you eat too soon. Rest after you get home. Know the things you should not do after the procedure. Follow the diet recommended by your health care provider and drink enough fluid to keep your urine pale yellow. Get help right away if you have trouble breathing or new-onset confusion at home. This information is not intended to replace advice given to you by your health care provider. Make sure you discuss any questions you have with your healthcare provider. Document Revised: 03/24/2020 Document Reviewed: 10/21/2019 Elsevier Patient Education  2022 [employer].

## 2022-07-05 LAB — SURGICAL PATHOLOGY

## 2022-07-10 ENCOUNTER — Encounter (HOSPITAL_COMMUNITY): Payer: Self-pay | Admitting: Hematology & Oncology

## 2022-07-15 ENCOUNTER — Inpatient Hospital Stay (HOSPITAL_BASED_OUTPATIENT_CLINIC_OR_DEPARTMENT_OTHER): Payer: Medicare Other | Admitting: Hematology & Oncology

## 2022-07-15 ENCOUNTER — Other Ambulatory Visit: Payer: Self-pay

## 2022-07-15 ENCOUNTER — Inpatient Hospital Stay: Payer: Medicare Other | Attending: Family

## 2022-07-15 ENCOUNTER — Encounter: Payer: Self-pay | Admitting: Hematology & Oncology

## 2022-07-15 VITALS — BP 101/44 | HR 59 | Temp 98.2°F | Resp 17 | Wt 232.0 lb

## 2022-07-15 DIAGNOSIS — Z881 Allergy status to other antibiotic agents status: Secondary | ICD-10-CM | POA: Insufficient documentation

## 2022-07-15 DIAGNOSIS — R197 Diarrhea, unspecified: Secondary | ICD-10-CM | POA: Diagnosis not present

## 2022-07-15 DIAGNOSIS — C9110 Chronic lymphocytic leukemia of B-cell type not having achieved remission: Secondary | ICD-10-CM | POA: Insufficient documentation

## 2022-07-15 DIAGNOSIS — Z79899 Other long term (current) drug therapy: Secondary | ICD-10-CM | POA: Diagnosis not present

## 2022-07-15 DIAGNOSIS — Z7969 Long term (current) use of other immunomodulators and immunosuppressants: Secondary | ICD-10-CM | POA: Insufficient documentation

## 2022-07-15 LAB — CBC WITH DIFFERENTIAL (CANCER CENTER ONLY)
Abs Immature Granulocytes: 0.01 10*3/uL (ref 0.00–0.07)
Basophils Absolute: 0 10*3/uL (ref 0.0–0.1)
Basophils Relative: 0 %
Eosinophils Absolute: 0 10*3/uL (ref 0.0–0.5)
Eosinophils Relative: 0 %
HCT: 37.8 % (ref 36.0–46.0)
Hemoglobin: 12.9 g/dL (ref 12.0–15.0)
Immature Granulocytes: 0 %
Lymphocytes Relative: 36 %
Lymphs Abs: 1.6 10*3/uL (ref 0.7–4.0)
MCH: 33.6 pg (ref 26.0–34.0)
MCHC: 34.1 g/dL (ref 30.0–36.0)
MCV: 98.4 fL (ref 80.0–100.0)
Monocytes Absolute: 0.4 10*3/uL (ref 0.1–1.0)
Monocytes Relative: 8 %
Neutro Abs: 2.5 10*3/uL (ref 1.7–7.7)
Neutrophils Relative %: 56 %
Platelet Count: 139 10*3/uL — ABNORMAL LOW (ref 150–400)
RBC: 3.84 MIL/uL — ABNORMAL LOW (ref 3.87–5.11)
RDW: 12.6 % (ref 11.5–15.5)
WBC Count: 4.5 10*3/uL (ref 4.0–10.5)
nRBC: 0 % (ref 0.0–0.2)

## 2022-07-15 LAB — CMP (CANCER CENTER ONLY)
ALT: 13 U/L (ref 0–44)
AST: 16 U/L (ref 15–41)
Albumin: 4.3 g/dL (ref 3.5–5.0)
Alkaline Phosphatase: 63 U/L (ref 38–126)
Anion gap: 7 (ref 5–15)
BUN: 12 mg/dL (ref 8–23)
CO2: 26 mmol/L (ref 22–32)
Calcium: 9.2 mg/dL (ref 8.9–10.3)
Chloride: 109 mmol/L (ref 98–111)
Creatinine: 0.78 mg/dL (ref 0.44–1.00)
GFR, Estimated: >60 mL/min (ref >60)
Glucose, Bld: 93 mg/dL (ref 70–99)
Potassium: 4.3 mmol/L (ref 3.5–5.1)
Sodium: 142 mmol/L (ref 135–145)
Total Bilirubin: 0.8 mg/dL (ref 0.3–1.2)
Total Protein: 5.9 g/dL — ABNORMAL LOW (ref 6.5–8.1)

## 2022-07-15 LAB — LACTATE DEHYDROGENASE: LDH: 181 U/L (ref 98–192)

## 2022-07-15 NOTE — Progress Notes (Signed)
Hematology and Oncology Follow Up Visit  Sara Sanford [id number] Oct 06, 1956 66 y.o. 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  Sara Sanford is doing okay.  Sara Sanford still by Sara Sanford left knee.  Unfortunately, because of Sara Sanford weight, Sara Sanford is not can be able to have surgery.  We did do a bone marrow biopsy on Sara Sanford.  This was done on 07/02/2022.  The pathology report [id number]) showed a nodular lymphocytic infiltrate which was 10% of the bone marrow.  This was monoclonal B cells.  As such, Sara Sanford still has some residual disease.  This certainly is not a lot.  I do think that we will get have to keep Sara Sanford on the venetoclax.  This would be very reasonable way to go.  Sara Sanford tolerated the venetoclax pretty well.  Sara Sanford is does have some diarrhea.  Imodium does help.  Sara Sanford has had no problems with nausea or vomiting.  Sara Sanford has had no change in bowel or bladder habits.  Sara Sanford has had no fever.  There is been no cough.  Sara Sanford has had no leading.  Overall, I would say that Sara Sanford performance status is probably ECOG 1.      Medications:  Allergies as of 07/15/2022       Reactions   Propoxyphene Anaphylaxis, Shortness Of Breath, Other (See Comments)   Hallucinations   Keflex [cephalexin] Other (See Comments)   Diarrrhea   Nitrofurantoin Diarrhea, Rash   Welts with blisters        Medication List        Accurate as of July 15, 2022  3:30 PM. If you have any questions, ask your nurse or doctor.           Calquence 100 MG Tabs Generic drug: acalabrutinib maleate Take 1 tablet (100 mg) by mouth 2 (two) times daily.   cyanocobalamin 1000 MCG tablet Commonly known as: VITAMIN B12 Take 1,000 mcg by mouth daily.   FLUoxetine 20 MG capsule Commonly known as: PROZAC TAKE ONE CAPSULE BY MOUTH DAILY   folic acid 1 MG tablet Commonly known as: FOLVITE TAKE TWO TABLETS BY MOUTH DAILY   nitroGLYCERIN 0.4 MG SL tablet Commonly known as: NITROSTAT Place 1 tablet (0.4 mg total) under the tongue every 5 (five) minutes as needed.   Venclexta 100 MG tablet Generic drug: venetoclax TAKE 2 TABLETS (200 MG) BY MOUTH DAILY. What changed:  how much to take how to take this when to take this additional instructions        Allergies:  Allergies  Allergen Reactions   Propoxyphene Anaphylaxis, Shortness Of Breath and Other (See Comments)    Hallucinations    Keflex [Cephalexin] Other (See Comments)    Diarrrhea   Nitrofurantoin Diarrhea and Rash    Welts with blisters    Past Medical History, Surgical history, Social history, and Family History were reviewed and updated.  Review of Systems: Review of Systems  Constitutional: Negative.   HENT: Negative.    Eyes: Negative.   Respiratory: Negative.    Cardiovascular: Negative.   Gastrointestinal: Negative.   Genitourinary: Negative.   Musculoskeletal: Negative.   Skin: Negative.   Neurological: Negative.   Endo/Heme/Allergies: Negative.   Psychiatric/Behavioral: Negative.       Physical Exam:  weight is 232 lb (105.2 kg). Sara Sanford oral temperature is 98.2 F (36.8 C). Sara Sanford blood pressure is 101/44 (abnormal) and Sara Sanford pulse is 59 (abnormal). Sara Sanford respiration is 17 and oxygen saturation is 99%.   Wt Readings from Last 3 Encounters:  07/15/22 232 lb (105.2 kg)  04/25/22 234 lb (106.1 kg)  01/18/22 236 lb (107 kg)    Physical Exam Vitals reviewed.  HENT:     Head: Normocephalic and atraumatic.  Eyes:     Pupils: Pupils are  equal, round, and reactive to light.  Cardiovascular:     Rate and Rhythm: Normal rate and regular rhythm.     Heart sounds: Normal heart sounds.  Pulmonary:     Effort: Pulmonary effort is normal.     Breath sounds: Normal breath sounds.  Abdominal:     General: Bowel sounds are normal.     Palpations: Abdomen is soft.  Musculoskeletal:        General: No tenderness or deformity. Normal range of motion.     Cervical back: Normal range of motion.  Lymphadenopathy:     Cervical: No cervical adenopathy.  Skin:    General: Skin is warm and dry.     Findings: No erythema or rash.  Neurological:     Mental Status: Sara Sanford is alert and oriented to person, place, and time.  Psychiatric:        Behavior: Behavior normal.        Thought Content: Thought content normal.        Judgment: Judgment normal.     Lab Results  Component Value Date   WBC 4.5 07/15/2022   HGB 12.9 07/15/2022   HCT 37.8 07/15/2022   MCV 98.4 07/15/2022   PLT 139 (L) 07/15/2022   Lab Results  Component Value Date   FERRITIN 25 05/15/2020   IRON 89 05/15/2020   TIBC 344 05/15/2020   UIBC 255 05/15/2020   IRONPCTSAT 26 05/15/2020   Lab Results  Component Value Date   RETICCTPCT 2.5 05/15/2020   RBC 3.84 (L) 07/15/2022   Lab Results  Component Value Date   KPAFRELGTCHN 11.9 10/18/2021   LAMBDASER 2.7 (L) 10/18/2021   KAPLAMBRATIO 4.41 (H) 10/18/2021   Lab Results  Component Value Date   IGGSERUM 438 (L) 10/18/2021   IGA 18 (L) 10/18/2021   IGMSERUM 28 10/18/2021   Lab Results  Component Value Date   TOTALPROTELP 5.5 (L) 10/18/2021   ALBUMINELP 3.5 10/18/2021   A1GS 0.2 10/18/2021   A2GS 0.6 10/18/2021   BETS 0.9 10/18/2021   GAMS 0.4 10/18/2021   MSPIKE Not Observed 10/18/2021     Chemistry      Component Value Date/Time   NA 141 04/25/2022 1324   K 4.4 04/25/2022 1324   CL  106 04/25/2022 1324   CO2 29 04/25/2022 1324   BUN 14 04/25/2022 1324   CREATININE 0.75 04/25/2022 1324    CREATININE 0.69 02/01/2020 1149      Component Value Date/Time   CALCIUM 9.1 04/25/2022 1324   ALKPHOS 61 04/25/2022 1324   AST 16 04/25/2022 1324   ALT 14 04/25/2022 1324   BILITOT 0.6 04/25/2022 1324       Impression and Plan: Sara Sanford is a very pleasant 66 yo caucasian female with CLL.  I think Sara Sanford has relatively decent prognostic markers.  Again, we did do the bone marrow test.  He does show that there is still some minimal disease.  I think 10% is really not all that bad.  However, they will to keep Sara Sanford on treatment.  We will have Sara Sanford on single agent venetoclax.  I think that if Sara Sanford does showed evidence of progression, we can then add Gazyva to the venetoclax.  Overall, Sara Sanford quality of life is doing quite well.  Sara Sanford is mostly bothered by Sara Sanford arthritis.  Of note, Sara Sanford does have a hammertoe on the left foot.  I told Sara Sanford that Sara Sanford could certainly get this taken care of.  At this point, we will plan to get Sara Sanford back in 3 months.  We will get Sara Sanford back before the Holiday season and then hopefully get Sara Sanford through the Holiday season.    Volanda Napoleon, MD 8/7/20233:30 PM

## 2022-07-16 ENCOUNTER — Telehealth: Payer: Self-pay | Admitting: Family Medicine

## 2022-07-16 LAB — KAPPA/LAMBDA LIGHT CHAINS
Kappa free light chain: 7.7 mg/L (ref 3.3–19.4)
Kappa, lambda light chain ratio: 2.75 — ABNORMAL HIGH (ref 0.26–1.65)
Lambda free light chains: 2.8 mg/L — ABNORMAL LOW (ref 5.7–26.3)

## 2022-07-16 LAB — IGG, IGA, IGM
IgA: 18 mg/dL — ABNORMAL LOW (ref 87–352)
IgG (Immunoglobin G), Serum: 421 mg/dL — ABNORMAL LOW (ref 586–1602)
IgM (Immunoglobulin M), Srm: 19 mg/dL — ABNORMAL LOW (ref 26–217)

## 2022-07-16 NOTE — Telephone Encounter (Signed)
Form completed and placed in [facility] B/Key box

## 2022-07-16 NOTE — Telephone Encounter (Signed)
FYI

## 2022-07-16 NOTE — Telephone Encounter (Signed)
Patient dropped off a parking placard form to be filled out, billing form with patient's information filled out and placed on top and placed in provider box. AMUCK *07/16/22

## 2022-07-17 LAB — SURGICAL PATHOLOGY

## 2022-07-19 NOTE — Telephone Encounter (Signed)
Patient advised the form is up front ready for pick up.

## 2022-07-24 ENCOUNTER — Ambulatory Visit (INDEPENDENT_AMBULATORY_CARE_PROVIDER_SITE_OTHER): Payer: Medicare Other | Admitting: Family Medicine

## 2022-07-24 DIAGNOSIS — Z0000 Encounter for general adult medical examination without abnormal findings: Secondary | ICD-10-CM | POA: Diagnosis not present

## 2022-07-24 DIAGNOSIS — Z1231 Encounter for screening mammogram for malignant neoplasm of breast: Secondary | ICD-10-CM | POA: Diagnosis not present

## 2022-07-24 NOTE — Progress Notes (Signed)
MEDICARE ANNUAL WELLNESS VISIT  07/24/2022  Telephone Visit Disclaimer This Medicare AWV was conducted by telephone due to national recommendations for restrictions regarding the COVID-19 Pandemic (e.g. social distancing).  I verified, using two identifiers, that I am speaking with Sara Sanford or their authorized healthcare agent. I discussed the limitations, risks, security, and privacy concerns of performing an evaluation and management service by telephone and the potential availability of an in-person appointment in the future. The patient expressed understanding and agreed to proceed.  Location of Patient: Home Location of Provider (nurse):  In the office  Subjective:    Sara Sanford is a 66 y.o. female patient of Metheney, Rene Kocher, MD who had a Medicare Annual Wellness Visit today via telephone. Braelin is Retired and lives with their spouse. she has 2 children. she reports that she is socially active and does interact with friends/family regularly. she is minimally physically active and enjoys sewing.  Patient Care Team: Hali Marry, MD as PCP - General (Family Medicine)     07/24/2022    8:56 AM 07/15/2022    3:25 PM 07/02/2022    9:12 AM 04/25/2022    1:44 PM 01/18/2022    2:09 PM 10/18/2021   12:02 PM 07/23/2021    2:14 PM  Advanced Directives  Does Patient Have a Medical Advance Directive? No No No No No No No  Would patient like information on creating a medical advance directive? No - Patient declined No - Patient declined No - Patient declined No - Patient declined No - Patient declined No - Patient declined No - Patient declined    Hospital Utilization Over the Past 12 Months: # of hospitalizations or ER visits: 0 # of surgeries: 0  Review of Systems    Patient reports that her overall health is unchanged compared to last year.  History obtained from chart review and the patient  Patient Reported Readings (BP, Pulse, CBG, Weight,  etc) none  Pain Assessment Pain : No/denies pain Pain Score: 0-No pain     Current Medications & Allergies (verified) Allergies as of 07/24/2022       Reactions   Propoxyphene Anaphylaxis, Shortness Of Breath, Other (See Comments)   Hallucinations   Keflex [cephalexin] Other (See Comments)   Diarrrhea   Nitrofurantoin Diarrhea, Rash   Welts with blisters        Medication List        Accurate as of July 24, 2022  9:10 AM. If you have any questions, ask your nurse or doctor.          Calquence 100 MG Tabs Generic drug: acalabrutinib maleate Take 1 tablet (100 mg) by mouth 2 (two) times daily.   cyanocobalamin 1000 MCG tablet Commonly known as: VITAMIN B12 Take 1,000 mcg by mouth daily.   FLUoxetine 20 MG capsule Commonly known as: PROZAC TAKE ONE CAPSULE BY MOUTH DAILY   folic acid 1 MG tablet Commonly known as: FOLVITE TAKE TWO TABLETS BY MOUTH DAILY   nitroGLYCERIN 0.4 MG SL tablet Commonly known as: NITROSTAT Place 1 tablet (0.4 mg total) under the tongue every 5 (five) minutes as needed.   Venclexta 100 MG tablet Generic drug: venetoclax TAKE 2 TABLETS (200 MG) BY MOUTH DAILY. What changed:  how much to take how to take this when to take this additional instructions        History (reviewed): Past Medical History:  Diagnosis Date   Abdominal aortic aneurysm ([place]) 08/01/2014  3.2 cm seen on [place] 07/2014. No change 02/2018. Repeat in 3 yr.    Allergy    Anxiety    Aortic atherosclerosis ([place]) 08/23/2021   Arthritis    Cholelithiasis 08/17/2014   CLL (chronic lymphocytic leukemia) ([place]) 08/12/2019   GAD (generalized anxiety disorder) 02/17/2017   GERD 05/22/2009   Qualifier: Diagnosis of  By: Valetta Close DO, Karen     Heart burn    peptic ulcer   Hyperlipidemia 01/20/2020   Kidney stones    Left leg pain 09/09/2019   LEG EDEMA, BILATERAL 05/22/2009   Qualifier: Diagnosis of  By: Valetta Close DO, Karen     Lichen sclerosus    Macrocytic anemia 09/07/2019    OBESITY 05/22/2009   Qualifier: Diagnosis of  By: Esmeralda Arthur     POLYARTHRITIS 06/19/2010   Qualifier: Diagnosis of  By: Valetta Close DO, Karen     Post-menopausal    Primary osteoarthritis of left knee 09/09/2019   Rash and nonspecific skin eruption 01/03/2020   Recurrent UTI 06/30/2019   Past Surgical History:  Procedure Laterality Date   CARDIAC CATHETERIZATION  2008   CHOLECYSTECTOMY     COLONOSCOPY  2010   [place], [place]   DILATION AND CURETTAGE OF UTERUS     ESOPHAGOGASTRODUODENOSCOPY  2005   [place] had egd and colon at the same time    GALLBLADDER SURGERY  2015   HYSTEROSCOPY WITH D & C  2008   KIDNEY STONE SURGERY  1977   UPPER GASTROINTESTINAL ENDOSCOPY     Family History  Problem Relation Age of Onset   Breast cancer Paternal Aunt        pm breast CA   Breast cancer Paternal Aunt        PM breast CA   Heart attack Father    Hypertension Father    Colon polyps Father    Hypertension Mother    Stomach cancer Maternal Grandmother    Colon cancer Neg Hx    Rectal cancer Neg Hx    Social History   Socioeconomic History   Marital status: Married    Spouse name: Genevra Orne   Number of children: 2   Years of education: 12th grade   Highest education level: 12th grade  Occupational History   Occupation: Cytogeneticist: Company secretary   Occupation: Retired  Tobacco Use   Smoking status: Never   Smokeless tobacco: Never  Scientific laboratory technician Use: Never used  Substance and Sexual Activity   Alcohol use: No   Drug use: Never   Sexual activity: Yes    Partners: Male    Birth control/protection: Post-menopausal  Other Topics Concern   Not on file  Social History Narrative   Lives with her husband. She has two children. She enjoys sewing and quilting.    Social Determinants of Health   Financial Resource Strain: Low Risk  (07/24/2022)   Overall Financial Resource Strain (CARDIA)    Difficulty of Paying Living Expenses: Not hard at all  Food  Insecurity: No Food Insecurity (07/24/2022)   Hunger Vital Sign    Worried About Running Out of Food in the Last Year: Never true    Ran Out of Food in the Last Year: Never true  Transportation Needs: No Transportation Needs (07/24/2022)   PRAPARE - Hydrologist (Medical): No    Lack of Transportation (Non-Medical): No  Physical Activity: Inactive (07/24/2022)   Exercise Vital Sign  Days of Exercise per Week: 0 days    Minutes of Exercise per Session: 0 min  Stress: No Stress Concern Present (07/24/2022)   [place]    Feeling of Stress : Not at all  Social Connections: [place] (07/24/2022)   Social Connection and Isolation Panel [NHANES]    Frequency of Communication with Friends and Family: More than three times a week    Frequency of Social Gatherings with Friends and Family: Once a week    Attends Religious Services: More than 4 times per year    Active Member of [employer] or Organizations: Yes    Attends Archivist Meetings: More than 4 times per year    Marital Status: Married    Activities of Daily Living    07/24/2022    9:02 AM  In your present state of health, do you have any difficulty performing the following activities:  Hearing? 1  Comment bilateral  Vision? 0  Difficulty concentrating or making decisions? 0  Walking or climbing stairs? 1  Comment due to bilateral knee pain.  Dressing or bathing? 0  Doing errands, shopping? 0  Preparing Food and eating ? N  Using the Toilet? N  In the past six months, have you accidently leaked urine? N  Do you have problems with loss of bowel control? N  Managing your Medications? N  Managing your Finances? N  Housekeeping or managing your Housekeeping? N    Patient Education/ Literacy How often do you need to have someone help you when you read instructions, pamphlets, or other written materials from your doctor  or pharmacy?: 1 - Never What is the last grade level you completed in school?: Associates degree  Exercise Current Exercise Habits: The patient does not participate in regular exercise at present, Exercise limited by: orthopedic condition(s)  Diet Patient reports consuming 2 meals a day and 1 snack(s) a day Patient reports that her primary diet is: Regular Patient reports that she does have regular access to food.   Depression Screen    07/24/2022    8:57 AM 07/23/2021    2:18 PM 01/20/2020    1:01 PM 07/19/2019    1:17 PM 01/12/2019    1:34 PM 06/25/2018    3:13 PM 06/16/2017   11:19 AM  PHQ 2/9 Scores  PHQ - 2 Score 0 0 1 0 1 0 0  PHQ- 9 Score 0   '2 3  2     '$ Fall Risk    07/24/2022    8:57 AM 07/23/2021    2:17 PM 01/20/2020   12:58 PM  Fall Risk   Falls in the past year? '1 1 1  '$ Number falls in past yr: 1 1 0  Injury with Fall? 0 0 0  Risk for fall due to : Impaired balance/gait;History of fall(s) History of fall(s);Impaired balance/gait;Orthopedic patient Impaired balance/gait  Follow up Falls evaluation completed;Education provided;Falls prevention discussed Falls evaluation completed;Education provided;Falls prevention discussed Falls prevention discussed     Objective:  Sara Sanford seemed alert and oriented and she participated appropriately during our telephone visit.  Blood Pressure Weight BMI  BP Readings from Last 3 Encounters:  07/15/22 (!) 101/44  07/02/22 (!) 122/56  04/25/22 (!) 90/41   Wt Readings from Last 3 Encounters:  07/15/22 232 lb (105.2 kg)  04/25/22 234 lb (106.1 kg)  01/18/22 236 lb (107 kg)   BMI Readings from Last 1 Encounters:  07/15/22 39.82  kg/m    *Unable to obtain current vital signs, weight, and BMI due to telephone visit type  Hearing/Vision  Odaly did not seem to have difficulty with hearing/understanding during the telephone conversation Reports that she has had a formal eye exam by an eye care professional within the past  year Reports that she has not had a formal hearing evaluation within the past year *Unable to fully assess hearing and vision during telephone visit type  Cognitive Function:    07/24/2022    9:04 AM 07/23/2021    2:23 PM  6CIT Screen  What Year? 0 points 0 points  What month? 0 points 0 points  What time? 0 points 0 points  Count back from 20 0 points 0 points  Months in reverse 0 points 0 points  Repeat phrase 0 points 0 points  Total Score 0 points 0 points   (Normal:0-7, Significant for Dysfunction: >8)  Normal Cognitive Function Screening: Yes   Immunization & Health Maintenance Record Immunization History  Administered Date(s) Administered   Fluad Quad(high Dose 65+) 12/04/2021   Influenza Whole 09/21/2009   Influenza, High Dose Seasonal PF 09/09/2019, 11/04/2020   Influenza,inj,Quad PF,6+ Mos 09/23/2018   Influenza-Unspecified 09/21/2015, 09/17/2016, 09/13/2017, 09/23/2018   Moderna Sars-Covid-2 Vaccination 01/21/2020, 02/19/2020   Pneumococcal Conjugate-13 09/09/2019   Tdap 08/03/2012   Zoster, Live 08/21/2015    Health Maintenance  Topic Date Due   OPHTHALMOLOGY EXAM  07/24/2022 (Originally 08/18/1966)   Diabetic kidney evaluation - Urine ACR  07/25/2022 (Originally 08/18/1974)   FOOT EXAM  07/25/2022 (Originally 08/18/1966)   COVID-19 Vaccine (3 - Moderna risk series) 08/09/2022 (Originally 03/18/2020)   HEMOGLOBIN A1C  08/24/2022 (Originally 12/01/2020)   Zoster Vaccines- Shingrix (1 of 2) 10/24/2022 (Originally 08/18/1973)   INFLUENZA VACCINE  03/09/2023 (Originally 07/09/2022)   Pneumonia Vaccine 42+ Years old (2 - PPSV23 or PCV20) 07/25/2023 (Originally 11/04/2019)   TETANUS/TDAP  08/03/2022   MAMMOGRAM  07/05/2023   Diabetic kidney evaluation - GFR measurement  07/16/2023   COLONOSCOPY (Pts 45-11yr Insurance coverage will need to be confirmed)  12/27/2029   DEXA SCAN  Completed   Hepatitis C Screening  Completed   HPV VACCINES  Aged Out       Assessment   This is a routine wellness examination for [place]  Health Maintenance: Due or Overdue There are no preventive care reminders to display for this patient.   SJake Bathed[place] not need a referral for Community Assistance: Care Management:   no Social Work:    no Prescription Assistance:  no Nutrition/Diabetes Education:  no   Plan:  Personalized Goals  Goals Addressed               This Visit's Progress     Patient Stated (pt-stated)        07/24/2022 AWV Goal: Exercise for General Health  Patient will verbalize understanding of the benefits of increased physical activity: Exercising regularly is important. It will improve your overall fitness, flexibility, and endurance. Regular exercise also will improve your overall health. It can help you control your weight, reduce stress, and improve your bone density. Over the next year, patient will increase physical activity as tolerated with a goal of at least 150 minutes of moderate physical activity per week.  You can tell that you are exercising at a moderate intensity if your heart starts beating faster and you start breathing faster but can still hold a conversation. Moderate-intensity exercise ideas include: Walking 1 mile (  1.6 km) in about 15 minutes [place] [employer] Water aerobics Patient will verbalize understanding of everyday activities that increase physical activity by providing examples like the following: Yard work, such as: Sales promotion account executive Gardening Washing windows or floors Patient will be able to explain general safety guidelines for exercising:  Before you start a new exercise program, talk with your health care provider. Do not exercise so much that you hurt yourself, feel dizzy, or get very short of breath. Wear comfortable clothes and wear shoes with good support. Drink plenty of water while  you exercise to prevent dehydration or heat stroke. Work out until your breathing and your heartbeat get faster.        Personalized Health Maintenance & Screening Recommendations  Pneumococcal vaccine  Influenza vaccine Screening mammography Shingrix vaccine  Lung Cancer Screening Recommended: no (Low Dose CT Chest recommended if Age 75-80 years, 30 pack-year currently smoking OR have quit w/in past 15 years) Hepatitis C Screening recommended: no HIV Screening recommended: no  Advanced Directives: Written information was not prepared per patient's request.  Referrals & Orders Orders Placed This Encounter  Procedures   Mammogram 3D SCREEN BREAST BILATERAL    Follow-up Plan Follow-up with Hali Marry, MD as planned Discuss Pneumonia vaccine with PCP at next visit. Influenza vaccine can be done at your pharmacy or the office. Mammogram referral has been sent and they will call you to schedule. Schedule your shingrix vaccine at your pharmacy. Medicare wellness visit in one year. Patient will access AVS on my chart.   I have personally reviewed and noted the following in the patient's chart:   Medical and social history Use of alcohol, tobacco or illicit drugs  Current medications and supplements Functional ability and status Nutritional status Physical activity Advanced directives List of other physicians Hospitalizations, surgeries, and ER visits in previous 12 months Vitals Screenings to include cognitive, depression, and falls Referrals and appointments  In addition, I have reviewed and discussed with Sara Sanford certain preventive protocols, quality metrics, and best practice recommendations. A written personalized care plan for preventive services as well as general preventive health recommendations is available and can be mailed to the patient at her request.      Tinnie Gens, RN BSN  07/24/2022

## 2022-07-24 NOTE — Patient Instructions (Addendum)
[place] Maintenance Summary and Written Plan of Care  [name] ,  Thank you for allowing me to perform your Medicare Annual Wellness Visit and for your ongoing commitment to your health.   Health Maintenance & Immunization History Health Maintenance  Topic Date Due   OPHTHALMOLOGY EXAM  07/24/2022 (Originally 08/18/1966)   Diabetic kidney evaluation - Urine ACR  07/25/2022 (Originally 08/18/1974)   FOOT EXAM  07/25/2022 (Originally 08/18/1966)   COVID-19 Vaccine (3 - Moderna risk series) 08/09/2022 (Originally 03/18/2020)   HEMOGLOBIN A1C  08/24/2022 (Originally 12/01/2020)   Zoster Vaccines- Shingrix (1 of 2) 10/24/2022 (Originally 08/18/1973)   INFLUENZA VACCINE  03/09/2023 (Originally 07/09/2022)   Pneumonia Vaccine 98+ Years old (2 - PPSV23 or PCV20) 07/25/2023 (Originally 11/04/2019)   TETANUS/TDAP  08/03/2022   MAMMOGRAM  07/05/2023   Diabetic kidney evaluation - GFR measurement  07/16/2023   COLONOSCOPY (Pts 45-64yr Insurance coverage will need to be confirmed)  12/27/2029   DEXA SCAN  Completed   Hepatitis C Screening  Completed   HPV VACCINES  Aged Out   Immunization History  Administered Date(s) Administered   Fluad Quad(high Dose 65+) 12/04/2021   Influenza Whole 09/21/2009   Influenza, High Dose Seasonal PF 09/09/2019, 11/04/2020   Influenza,inj,Quad PF,6+ Mos 09/23/2018   Influenza-Unspecified 09/21/2015, 09/17/2016, 09/13/2017, 09/23/2018   Moderna Sars-Covid-2 Vaccination 01/21/2020, 02/19/2020   Pneumococcal Conjugate-13 09/09/2019   Tdap 08/03/2012   Zoster, Live 08/21/2015    These are the patient goals that we discussed:  Goals Addressed               This Visit's Progress     Patient Stated (pt-stated)        07/24/2022 AWV Goal: Exercise for General Health  Patient will verbalize understanding of the benefits of increased physical activity: Exercising regularly is important. It will improve your overall fitness,  flexibility, and endurance. Regular exercise also will improve your overall health. It can help you control your weight, reduce stress, and improve your bone density. Over the next year, patient will increase physical activity as tolerated with a goal of at least 150 minutes of moderate physical activity per week.  You can tell that you are exercising at a moderate intensity if your heart starts beating faster and you start breathing faster but can still hold a conversation. Moderate-intensity exercise ideas include: Walking 1 mile (1.6 km) in about 15 minutes Biking Hiking Golfing Dancing Water aerobics Patient will verbalize understanding of everyday activities that increase physical activity by providing examples like the following: Yard work, such as: PSales promotion account executiveGardening Washing windows or floors Patient will be able to explain general safety guidelines for exercising:  Before you start a new exercise program, talk with your health care provider. Do not exercise so much that you hurt yourself, feel dizzy, or get very short of breath. Wear comfortable clothes and wear shoes with good support. Drink plenty of water while you exercise to prevent dehydration or heat stroke. Work out until your breathing and your heartbeat get faster.          This is a list of Health Maintenance Items that are overdue or due now: Pneumococcal vaccine  Influenza vaccine Screening mammography Shingrix vaccine  Orders/Referrals Placed Today: Orders Placed This Encounter  Procedures   Mammogram 3D SCREEN BREAST BILATERAL    Standing Status:   Future    Standing Expiration  Date:   07/25/2023    Scheduling Instructions:     Please call patient to schedule    Order Specific Question:   Reason for Exam (SYMPTOM  OR DIAGNOSIS REQUIRED)    Answer:   breast cancer screening    Order Specific Question:    Preferred imaging location?    Answer:   [facility]    (Contact our referral department at [phone] if you have not spoken with someone about your referral appointment within the next 5 days)    Follow-up Plan Follow-up with Hali Marry, MD as planned Discuss Pneumonia vaccine with PCP at next visit. Influenza vaccine can be done at your pharmacy or the office. Mammogram referral has been sent and they will call you to schedule. Schedule your shingrix vaccine at your pharmacy. Medicare wellness visit in one year. Patient will access AVS on my chart.      Mammogram A mammogram is an X-ray of the breasts. This procedure can screen for and detect any changes that may indicate breast cancer. Mammograms are regularly done beginning at age 34 for women with average risk. A man may have a mammogram if he has a lump or swelling in his breast tissue. A mammogram can also identify other changes and variations in the breast, such as: Inflammation of the breast tissue (mastitis). An infected area that contains a collection of pus (abscess). A fluid-filled sac (cyst). Tumors that are not cancerous (benign). Fibrocystic changes. This is when breast tissue becomes denser and can make the tissue feel rope-like or uneven under the skin. Women at higher risk for breast cancer need earlier and more comprehensive screening for abnormal changes. Breast tomosynthesis, or three-dimensional (3D) mammography, and digital breast tomosynthesis are advanced forms of imaging that create 3D pictures of the breasts. Tell a health care provider: About any allergies you have. If you have breast implants. If you have had previous breast disease, biopsy, or surgery. If you have a family history of breast cancer. If you are breastfeeding. Whether you are pregnant or may be pregnant. What are the risks? Generally, this is a safe procedure. However, problems may occur, including: Exposure to  radiation. Radiation levels are very low with this test. The need for more tests. The mammogram fails to detect certain cancers or the results are misinterpreted. Difficulty with detecting breast cancer in women with dense breasts. What happens before the procedure? Schedule your test about 1-2 weeks after your menstrual period if you are menstruating. This is usually when your breasts are the least tender. If you have had a mammogram done at a different facility in the past, get the mammogram X-rays or have them sent to your current exam facility. The new and old images will be compared. Wash your breasts and underarms on the day of the test. Do not wear deodorants, perfumes, lotions, or powders anywhere on your body on the day of the test. Remove any jewelry from your neck. Wear clothes that you can change into and out of easily. What happens during the procedure?  You will undress from the waist up and put on a gown that opens in the front. You will stand in front of the X-ray machine. Each breast will be placed between two plastic or glass plates. The plates will compress your breast for a few seconds. Try to stay as relaxed as possible. This procedure does not cause any harm to your breasts. Any discomfort you feel will be very brief. X-rays will be  taken from different angles of each breast. The procedure may vary among health care providers and hospitals. What can I expect after the procedure? The mammogram will be examined by a specialist (radiologist). You may need to repeat certain parts of the test, depending on the quality of the images. This is done if the radiologist needs a better view of the breast tissue. You may resume your normal activities. It is up to you to get the results of your procedure. Ask your health care provider, or the department that is doing the procedure, when your results will be ready. Summary A mammogram is an X-ray of the breasts. This procedure can  screen for and detect any changes that may indicate breast cancer. A man may have a mammogram if he has a lump or swelling in his breast tissue. If you have had a mammogram done at a different facility in the past, get the mammogram X-rays or have them sent to your current exam facility in order to compare them. Schedule your test about 1-2 weeks after your menstrual period if you are menstruating. Ask when your test results will be ready. Make sure you get your test results. This information is not intended to replace advice given to you by your health care provider. Make sure you discuss any questions you have with your health care provider. Document Revised: 08/08/2021 Document Reviewed: 09/25/2020 Elsevier Patient Education  [place].

## 2022-07-25 ENCOUNTER — Other Ambulatory Visit (HOSPITAL_COMMUNITY): Payer: Self-pay

## 2022-08-01 ENCOUNTER — Other Ambulatory Visit (HOSPITAL_COMMUNITY): Payer: Self-pay

## 2022-08-01 ENCOUNTER — Other Ambulatory Visit: Payer: Self-pay | Admitting: Hematology & Oncology

## 2022-08-01 MED ORDER — VENETOCLAX 100 MG PO TABS
ORAL_TABLET | ORAL | 3 refills | Status: DC
  Filled 2022-08-01: qty 60, 30d supply, fill #0
  Filled 2022-08-20: qty 60, 30d supply, fill #1
  Filled 2022-09-18: qty 60, 30d supply, fill #2
  Filled 2022-10-14: qty 60, 30d supply, fill #3

## 2022-08-02 ENCOUNTER — Other Ambulatory Visit (HOSPITAL_COMMUNITY): Payer: Self-pay

## 2022-08-03 ENCOUNTER — Encounter: Payer: Self-pay | Admitting: Family Medicine

## 2022-08-08 ENCOUNTER — Ambulatory Visit (INDEPENDENT_AMBULATORY_CARE_PROVIDER_SITE_OTHER): Payer: Medicare Other

## 2022-08-08 DIAGNOSIS — Z0000 Encounter for general adult medical examination without abnormal findings: Secondary | ICD-10-CM

## 2022-08-08 DIAGNOSIS — Z1231 Encounter for screening mammogram for malignant neoplasm of breast: Secondary | ICD-10-CM

## 2022-08-09 NOTE — Progress Notes (Signed)
Please call patient. Normal mammogram.  Repeat in 1 year.  

## 2022-08-15 ENCOUNTER — Other Ambulatory Visit (HOSPITAL_COMMUNITY): Payer: Self-pay

## 2022-08-16 ENCOUNTER — Telehealth: Payer: Self-pay | Admitting: Family Medicine

## 2022-08-16 NOTE — Telephone Encounter (Signed)
It is ready for pickup.

## 2022-08-16 NOTE — Telephone Encounter (Signed)
Patient dropped off Medical Excuse for court duty paperwork to be filled out by PCP. Patient stated that paperwork is urgent and must be completed by Monday if at all possible. Patient was made aware of 3-5 day turn around and potential fees. Paperwork was left in provider's box. Sara Sanford

## 2022-08-19 NOTE — Telephone Encounter (Signed)
Called patient and notified that paperwork is ready for pickup. Sara Sanford

## 2022-08-20 ENCOUNTER — Other Ambulatory Visit (HOSPITAL_COMMUNITY): Payer: Self-pay

## 2022-08-26 ENCOUNTER — Other Ambulatory Visit (HOSPITAL_COMMUNITY): Payer: Self-pay

## 2022-08-27 ENCOUNTER — Other Ambulatory Visit (HOSPITAL_COMMUNITY): Payer: Self-pay

## 2022-09-18 ENCOUNTER — Telehealth: Payer: Self-pay

## 2022-09-18 ENCOUNTER — Other Ambulatory Visit (HOSPITAL_COMMUNITY): Payer: Self-pay

## 2022-09-18 NOTE — Telephone Encounter (Signed)
Oral Oncology Patient Advocate Encounter   Received notification that assistance for Calquence has been renewed until 12/09/2023 through [employer].  This will allow the patient to receive their medication for $0.  [employer] phone number [phone].   Berdine Addison, [facility] Patient [place]  [phone] (phone) [phone] (fax) 09/18/2022 11:39 AM

## 2022-09-19 ENCOUNTER — Other Ambulatory Visit (HOSPITAL_COMMUNITY): Payer: Self-pay

## 2022-10-06 ENCOUNTER — Other Ambulatory Visit: Payer: Self-pay | Admitting: Obstetrics and Gynecology

## 2022-10-06 DIAGNOSIS — L900 Lichen sclerosus et atrophicus: Secondary | ICD-10-CM

## 2022-10-09 ENCOUNTER — Encounter: Payer: Self-pay | Admitting: Hematology & Oncology

## 2022-10-09 ENCOUNTER — Inpatient Hospital Stay (HOSPITAL_BASED_OUTPATIENT_CLINIC_OR_DEPARTMENT_OTHER): Payer: Medicare Other | Admitting: Hematology & Oncology

## 2022-10-09 ENCOUNTER — Inpatient Hospital Stay: Payer: Medicare Other | Attending: Family

## 2022-10-09 VITALS — BP 118/50 | HR 63 | Temp 97.5°F | Resp 18 | Wt 234.0 lb

## 2022-10-09 DIAGNOSIS — Z79899 Other long term (current) drug therapy: Secondary | ICD-10-CM | POA: Diagnosis not present

## 2022-10-09 DIAGNOSIS — Z881 Allergy status to other antibiotic agents status: Secondary | ICD-10-CM | POA: Diagnosis not present

## 2022-10-09 DIAGNOSIS — C9110 Chronic lymphocytic leukemia of B-cell type not having achieved remission: Secondary | ICD-10-CM | POA: Diagnosis not present

## 2022-10-09 DIAGNOSIS — Z7969 Long term (current) use of other immunomodulators and immunosuppressants: Secondary | ICD-10-CM | POA: Diagnosis not present

## 2022-10-09 LAB — CMP (CANCER CENTER ONLY)
ALT: 14 U/L (ref 0–44)
AST: 17 U/L (ref 15–41)
Albumin: 4.2 g/dL (ref 3.5–5.0)
Alkaline Phosphatase: 77 U/L (ref 38–126)
Anion gap: 8 (ref 5–15)
BUN: 12 mg/dL (ref 8–23)
CO2: 28 mmol/L (ref 22–32)
Calcium: 9.3 mg/dL (ref 8.9–10.3)
Chloride: 107 mmol/L (ref 98–111)
Creatinine: 0.82 mg/dL (ref 0.44–1.00)
GFR, Estimated: >60 mL/min (ref >60)
Glucose, Bld: 111 mg/dL — ABNORMAL HIGH (ref 70–99)
Potassium: 4.3 mmol/L (ref 3.5–5.1)
Sodium: 143 mmol/L (ref 135–145)
Total Bilirubin: 0.6 mg/dL (ref 0.3–1.2)
Total Protein: 6.5 g/dL (ref 6.5–8.1)

## 2022-10-09 LAB — CBC WITH DIFFERENTIAL (CANCER CENTER ONLY)
Abs Immature Granulocytes: 0.01 10*3/uL (ref 0.00–0.07)
Basophils Absolute: 0 10*3/uL (ref 0.0–0.1)
Basophils Relative: 0 %
Eosinophils Absolute: 0 10*3/uL (ref 0.0–0.5)
Eosinophils Relative: 1 %
HCT: 38.3 % (ref 36.0–46.0)
Hemoglobin: 13.1 g/dL (ref 12.0–15.0)
Immature Granulocytes: 0 %
Lymphocytes Relative: 33 %
Lymphs Abs: 1.3 10*3/uL (ref 0.7–4.0)
MCH: 32.8 pg (ref 26.0–34.0)
MCHC: 34.2 g/dL (ref 30.0–36.0)
MCV: 96 fL (ref 80.0–100.0)
Monocytes Absolute: 0.4 10*3/uL (ref 0.1–1.0)
Monocytes Relative: 12 %
Neutro Abs: 2.1 10*3/uL (ref 1.7–7.7)
Neutrophils Relative %: 54 %
Platelet Count: 139 10*3/uL — ABNORMAL LOW (ref 150–400)
RBC: 3.99 MIL/uL (ref 3.87–5.11)
RDW: 12 % (ref 11.5–15.5)
WBC Count: 3.8 10*3/uL — ABNORMAL LOW (ref 4.0–10.5)
nRBC: 0 % (ref 0.0–0.2)

## 2022-10-09 LAB — LACTATE DEHYDROGENASE: LDH: 210 U/L — ABNORMAL HIGH (ref 98–192)

## 2022-10-09 LAB — SAVE SMEAR(SSMR), FOR PROVIDER SLIDE REVIEW

## 2022-10-09 NOTE — Progress Notes (Unsigned)
ANNUAL EXAM Patient name: Sara Sanford MRN [id number]  Date of birth: 06-12-1956 Chief Complaint:   No chief complaint on file.  History of Present Illness:   Sara Sanford is a 66 y.o. [phone] female being seen today for a routine annual exam.   Current complaints: She has history of lichen sclerosus. She has done clobetasol successfully for this.   Of note, medically the patient has CLL and AAA  No LMP recorded. Patient is postmenopausal.  Last pap: 07/2020. Results were: NILM w/ HRHPV negative. H/O abnormal pap: no Last MXR: 08/2022  Health Maintenance Due  Topic Date Due   FOOT EXAM  Never done   OPHTHALMOLOGY EXAM  Never done   Diabetic kidney evaluation - Urine ACR  Never done   COVID-19 Vaccine (3 - Moderna risk series) 03/18/2020   HEMOGLOBIN A1C  12/01/2020   TETANUS/TDAP  08/03/2022    Review of Systems:   Pertinent items are noted in HPI Denies any headaches, blurred vision, fatigue, shortness of breath, chest pain, abdominal pain, abnormal vaginal discharge/itching/odor/irritation, problems with periods, bowel movements, urination, or intercourse unless otherwise stated above.  Pertinent History Reviewed:  Reviewed past medical,surgical, social and family history.  Reviewed problem list, medications and allergies. Physical Assessment:  There were no vitals filed for this visit.There is no height or weight on file to calculate BMI.   Physical Examination:  General appearance - well appearing, and in no distress Mental status - alert, oriented to person, place, and time Psych:  She has a normal mood and affect Skin - warm and dry, normal color, no suspicious lesions noted Chest - effort normal Heart - normal rate  Breasts - breasts appear normal, no suspicious masses, no skin or nipple changes or axillary nodes Abdomen - soft, nontender, nondistended, no masses or organomegaly Pelvic -  VULVA: normal appearing vulva with no masses, tenderness or lesions   VAGINA: normal appearing vagina with normal color and discharge, no lesions  CERVIX: normal appearing cervix without discharge or lesions, no CMT UTERUS: uterus is felt to be normal size, shape, consistency and nontender  ADNEXA: No adnexal masses or tenderness noted. Extremities:  No swelling or varicosities noted  Chaperone present for exam  Results for orders placed or performed in visit on 10/09/22 (from the past 24 hour(s))  CBC with Differential ([place] Only)   Collection Time: 10/09/22  1:24 PM  Result Value Ref Range   WBC Count 3.8 (L) 4.0 - 10.5 K/uL   RBC 3.99 3.87 - 5.11 MIL/uL   Hemoglobin 13.1 12.0 - 15.0 g/dL   HCT 38.3 36.0 - 46.0 %   MCV 96.0 80.0 - 100.0 fL   MCH 32.8 26.0 - 34.0 pg   MCHC 34.2 30.0 - 36.0 g/dL   RDW 12.0 11.5 - 15.5 %   Platelet Count 139 (L) 150 - 400 K/uL   nRBC 0.0 0.0 - 0.2 %   Neutrophils Relative % 54 %   Neutro Abs 2.1 1.7 - 7.7 K/uL   Lymphocytes Relative 33 %   Lymphs Abs 1.3 0.7 - 4.0 K/uL   Monocytes Relative 12 %   Monocytes Absolute 0.4 0.1 - 1.0 K/uL   Eosinophils Relative 1 %   Eosinophils Absolute 0.0 0.0 - 0.5 K/uL   Basophils Relative 0 %   Basophils Absolute 0.0 0.0 - 0.1 K/uL   Immature Granulocytes 0 %   Abs Immature Granulocytes 0.01 0.00 - 0.07 K/uL  CMP ([place] only)  Collection Time: 10/09/22  1:24 PM  Result Value Ref Range   Sodium 143 135 - 145 mmol/L   Potassium 4.3 3.5 - 5.1 mmol/L   Chloride 107 98 - 111 mmol/L   CO2 28 22 - 32 mmol/L   Glucose, Bld 111 (H) 70 - 99 mg/dL   BUN 12 8 - 23 mg/dL   Creatinine 0.82 0.44 - 1.00 mg/dL   Calcium 9.3 8.9 - 10.3 mg/dL   Total Protein 6.5 6.5 - 8.1 g/dL   Albumin 4.2 3.5 - 5.0 g/dL   AST 17 15 - 41 U/L   ALT 14 0 - 44 U/L   Alkaline Phosphatase 77 38 - 126 U/L   Total Bilirubin 0.6 0.3 - 1.2 mg/dL   GFR, Estimated >60 >60 mL/min   Anion gap 8 5 - 15  Lactate dehydrogenase   Collection Time: 10/09/22  1:24 PM  Result Value Ref Range   LDH  210 (H) 98 - 192 U/L  Save Smear for Provider Slide Review   Collection Time: 10/09/22  1:24 PM  Result Value Ref Range   Smear Review SMEAR STAINED AND AVAILABLE FOR REVIEW     Assessment & Plan:  Diagnoses and all orders for this visit:  Encounter for annual routine gynecological examination  Lichen sclerosus  - Cervical cancer screening: Discussed guidelines related to screening beyond 65. Based on guidelines, the patient meets criteria for cessation of pap smears.  Patient would like: to stop pap smears unless indicated - Breast Health: Encouraged self-breast awareness/SBE. Discussed limits of clinical breast exam for detecting breast cancer. Discussed importance of annual MXR. MXR is up to date: 08/2022 - Colonoscopy: Per PCP - F/U 12 months and prn  Will continue clobetasol but cautioned pt if skin changes or unresponsive to therapy, she needs to return for biopsy. ***  No orders of the defined types were placed in this encounter.   Meds: No orders of the defined types were placed in this encounter.   Follow-up: No follow-ups on file.  Radene Gunning, MD 10/09/2022 3:48 PM

## 2022-10-09 NOTE — Progress Notes (Signed)
Hematology and Oncology Follow Up Visit  Sara Sanford [id number] 1956-10-23 66 y.o. 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  She is doing okay.  She really has had no complaints.  She now is able to take Prilosec since she is off the acalabrutinib.  She is happy about doing this.  She is on venetoclax.  I think the venetoclax is working quite well for her.  She has had no problems outside of her knees.  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  She unfortunately not going to be able to have surgery for the knees.  I think she is worried about her weight being so much.  She has had no change in bowel or bladder habits.  She has had no swollen lymph nodes.  She has had no bleeding.  Overall, I would say performance status is ECOG 1.        Medications:  Allergies as of 10/09/2022       Reactions   Propoxyphene Anaphylaxis, Shortness Of Breath, Other (See Comments)   Hallucinations   Keflex [cephalexin] Other (See Comments)   Diarrrhea   Nitrofurantoin Diarrhea, Rash   Welts with blisters        Medication List        Accurate as of October 09, 2022  2:19 PM. If you have any questions, ask your nurse or doctor.          Calquence 100 MG tablet Generic drug: acalabrutinib maleate Take 1 tablet (100 mg) by mouth 2 (two) times daily.   cyanocobalamin 1000 MCG tablet Commonly known as: VITAMIN B12 Take 1,000 mcg by mouth daily.   FLUoxetine 20 MG capsule Commonly  known as: PROZAC TAKE ONE CAPSULE BY MOUTH DAILY   folic acid 1 MG tablet Commonly known as: FOLVITE TAKE TWO TABLETS BY MOUTH DAILY   nitroGLYCERIN 0.4 MG SL tablet Commonly known as: NITROSTAT Place 1 tablet (0.4 mg total) under the tongue every 5 (five) minutes as needed.   Venclexta 100 MG tablet Generic drug: venetoclax TAKE 2 TABLETS (200 MG) BY MOUTH DAILY.        Allergies:  Allergies  Allergen Reactions   Propoxyphene Anaphylaxis, Shortness Of Breath and Other (See Comments)    Hallucinations    Keflex [Cephalexin] Other (See Comments)    Diarrrhea   Nitrofurantoin Diarrhea and Rash    Welts with blisters    Past Medical History, Surgical history, Social history, and Family History were reviewed and updated.  Review of Systems: Review of  Systems  Constitutional: Negative.   HENT: Negative.    Eyes: Negative.   Respiratory: Negative.    Cardiovascular: Negative.   Gastrointestinal: Negative.   Genitourinary: Negative.   Musculoskeletal: Negative.   Skin: Negative.   Neurological: Negative.   Endo/Heme/Allergies: Negative.   Psychiatric/Behavioral: Negative.       Physical Exam:  weight is 234 lb (106.1 kg). Her oral temperature is 97.5 F (36.4 C) (abnormal). Her blood pressure is 118/50 (abnormal) and her pulse is 63. Her respiration is 18 and oxygen saturation is 98%.   Wt Readings from Last 3 Encounters:  10/09/22 234 lb (106.1 kg)  07/15/22 232 lb (105.2 kg)  04/25/22 234 lb (106.1 kg)    Physical Exam Vitals reviewed.  HENT:     Head: Normocephalic and atraumatic.  Eyes:     Pupils: Pupils are equal, round, and reactive to light.  Cardiovascular:     Rate and Rhythm: Normal rate and regular rhythm.     Heart sounds: Normal heart sounds.  Pulmonary:     Effort: Pulmonary effort is normal.     Breath sounds: Normal breath sounds.  Abdominal:     General: Bowel sounds are normal.     Palpations: Abdomen is soft.  Musculoskeletal:         General: No tenderness or deformity. Normal range of motion.     Cervical back: Normal range of motion.  Lymphadenopathy:     Cervical: No cervical adenopathy.  Skin:    General: Skin is warm and dry.     Findings: No erythema or rash.  Neurological:     Mental Status: She is alert and oriented to person, place, and time.  Psychiatric:        Behavior: Behavior normal.        Thought Content: Thought content normal.        Judgment: Judgment normal.      Lab Results  Component Value Date   WBC 3.8 (L) 10/09/2022   HGB 13.1 10/09/2022   HCT 38.3 10/09/2022   MCV 96.0 10/09/2022   PLT 139 (L) 10/09/2022   Lab Results  Component Value Date   FERRITIN 25 05/15/2020   IRON 89 05/15/2020   TIBC 344 05/15/2020   UIBC 255 05/15/2020   IRONPCTSAT 26 05/15/2020   Lab Results  Component Value Date   RETICCTPCT 2.5 05/15/2020   RBC 3.99 10/09/2022   Lab Results  Component Value Date   KPAFRELGTCHN 7.7 07/15/2022   LAMBDASER 2.8 (L) 07/15/2022   KAPLAMBRATIO 2.75 (H) 07/15/2022   Lab Results  Component Value Date   IGGSERUM 421 (L) 07/15/2022   IGA 18 (L) 07/15/2022   IGMSERUM 19 (L) 07/15/2022   Lab Results  Component Value Date   TOTALPROTELP 5.5 (L) 10/18/2021   ALBUMINELP 3.5 10/18/2021   A1GS 0.2 10/18/2021   A2GS 0.6 10/18/2021   BETS 0.9 10/18/2021   GAMS 0.4 10/18/2021   MSPIKE Not Observed 10/18/2021     Chemistry      Component Value Date/Time   NA 143 10/09/2022 1324   K 4.3 10/09/2022 1324   CL 107 10/09/2022 1324   CO2 28 10/09/2022 1324   BUN 12 10/09/2022 1324   CREATININE 0.82 10/09/2022 1324   CREATININE 0.69 02/01/2020 1149      Component Value Date/Time   CALCIUM 9.3 10/09/2022 1324   ALKPHOS 77 10/09/2022 1324   AST 17 10/09/2022 1324   ALT 14 10/09/2022 1324   BILITOT  0.6 10/09/2022 1324       Impression and Plan: Sara Sanford is a very pleasant 66 yo caucasian female with CLL.  I think she has relatively decent prognostic  markers.  Again, we did do the bone marrow test.  He does show that there is still some minimal disease.  I think 10% is really not all that bad.  However, they will to keep her on treatment.  We  have her on single agent venetoclax.  I think that if she does showed evidence of progression, we can then add Gazyva to the venetoclax.  Overall, her quality of life is doing quite well.  She is mostly bothered by her arthritis.  I would like to get her back after the holiday season now.  We will plan to get her back in 3 more months.    Volanda Napoleon, MD 11/1/20232:19 PM

## 2022-10-09 NOTE — Telephone Encounter (Signed)
Please schedule patient for annual.

## 2022-10-10 ENCOUNTER — Ambulatory Visit (INDEPENDENT_AMBULATORY_CARE_PROVIDER_SITE_OTHER): Payer: Medicare Other | Admitting: Obstetrics and Gynecology

## 2022-10-10 ENCOUNTER — Encounter: Payer: Self-pay | Admitting: Obstetrics and Gynecology

## 2022-10-10 VITALS — BP 106/63 | HR 73 | Ht 62.0 in | Wt 235.0 lb

## 2022-10-10 DIAGNOSIS — L900 Lichen sclerosus et atrophicus: Secondary | ICD-10-CM

## 2022-10-10 DIAGNOSIS — Z01419 Encounter for gynecological examination (general) (routine) without abnormal findings: Secondary | ICD-10-CM

## 2022-10-10 MED ORDER — CLOBETASOL PROPIONATE 0.05 % EX OINT: TOPICAL_OINTMENT | CUTANEOUS | 5 refills | Status: AC

## 2022-10-11 LAB — IGG, IGA, IGM
IgA: 17 mg/dL — ABNORMAL LOW (ref 87–352)
IgG (Immunoglobin G), Serum: 392 mg/dL — ABNORMAL LOW (ref 586–1602)
IgM (Immunoglobulin M), Srm: 54 mg/dL (ref 26–217)

## 2022-10-14 ENCOUNTER — Other Ambulatory Visit (HOSPITAL_COMMUNITY): Payer: Self-pay

## 2022-10-16 ENCOUNTER — Other Ambulatory Visit (HOSPITAL_COMMUNITY): Payer: Self-pay

## 2022-10-24 ENCOUNTER — Other Ambulatory Visit (HOSPITAL_COMMUNITY): Payer: Self-pay

## 2022-11-18 ENCOUNTER — Other Ambulatory Visit: Payer: Self-pay | Admitting: Pharmacist

## 2022-11-18 NOTE — Progress Notes (Signed)
Oral Oncology Pharmacist Encounter  Notified that [place] had previously discontinued by MD as patient is now on Venclexta (venetoclax). [employer] will be notified to discontinue patient's enrollment for 2024. Medication discontinued on patients medication list.   Leron Croak, PharmD, BCPS, [facility] Hematology/Oncology Clinical Pharmacist [facility] [place] [phone] 11/18/2022 10:34 AM

## 2022-11-29 ENCOUNTER — Other Ambulatory Visit: Payer: Self-pay | Admitting: Family Medicine

## 2022-12-04 ENCOUNTER — Other Ambulatory Visit (HOSPITAL_COMMUNITY): Payer: Self-pay

## 2022-12-06 ENCOUNTER — Other Ambulatory Visit (HOSPITAL_COMMUNITY): Payer: Self-pay

## 2022-12-06 ENCOUNTER — Other Ambulatory Visit: Payer: Self-pay | Admitting: Hematology & Oncology

## 2022-12-06 MED ORDER — VENETOCLAX 100 MG PO TABS
ORAL_TABLET | ORAL | 3 refills | Status: DC
  Filled 2022-12-06: qty 60, 30d supply, fill #0
  Filled 2023-01-14 (×2): qty 60, 30d supply, fill #1
  Filled 2023-02-18: qty 60, 30d supply, fill #2
  Filled 2023-04-15: qty 56, 28d supply, fill #3

## 2022-12-10 ENCOUNTER — Other Ambulatory Visit (HOSPITAL_COMMUNITY): Payer: Self-pay

## 2022-12-10 ENCOUNTER — Other Ambulatory Visit: Payer: Self-pay

## 2023-01-03 ENCOUNTER — Other Ambulatory Visit (HOSPITAL_COMMUNITY): Payer: Self-pay

## 2023-01-13 ENCOUNTER — Encounter: Payer: Self-pay | Admitting: Family

## 2023-01-13 ENCOUNTER — Inpatient Hospital Stay: Payer: Medicare Other | Attending: Family

## 2023-01-13 ENCOUNTER — Inpatient Hospital Stay (HOSPITAL_BASED_OUTPATIENT_CLINIC_OR_DEPARTMENT_OTHER): Payer: Medicare Other | Admitting: Family

## 2023-01-13 ENCOUNTER — Other Ambulatory Visit: Payer: Self-pay

## 2023-01-13 VITALS — BP 101/53 | HR 51 | Temp 98.2°F | Resp 16 | Ht 64.0 in | Wt 239.0 lb

## 2023-01-13 DIAGNOSIS — C9110 Chronic lymphocytic leukemia of B-cell type not having achieved remission: Secondary | ICD-10-CM | POA: Diagnosis not present

## 2023-01-13 DIAGNOSIS — R1011 Right upper quadrant pain: Secondary | ICD-10-CM | POA: Diagnosis not present

## 2023-01-13 DIAGNOSIS — R140 Abdominal distension (gaseous): Secondary | ICD-10-CM | POA: Insufficient documentation

## 2023-01-13 DIAGNOSIS — Z881 Allergy status to other antibiotic agents status: Secondary | ICD-10-CM | POA: Diagnosis not present

## 2023-01-13 DIAGNOSIS — Z79899 Other long term (current) drug therapy: Secondary | ICD-10-CM | POA: Diagnosis not present

## 2023-01-13 DIAGNOSIS — Z7969 Long term (current) use of other immunomodulators and immunosuppressants: Secondary | ICD-10-CM | POA: Insufficient documentation

## 2023-01-13 LAB — CBC WITH DIFFERENTIAL (CANCER CENTER ONLY)
Abs Immature Granulocytes: 0.01 10*3/uL (ref 0.00–0.07)
Basophils Absolute: 0 10*3/uL (ref 0.0–0.1)
Basophils Relative: 0 %
Eosinophils Absolute: 0 10*3/uL (ref 0.0–0.5)
Eosinophils Relative: 0 %
HCT: 38.2 % (ref 36.0–46.0)
Hemoglobin: 13.2 g/dL (ref 12.0–15.0)
Immature Granulocytes: 0 %
Lymphocytes Relative: 33 %
Lymphs Abs: 1.4 10*3/uL (ref 0.7–4.0)
MCH: 32.8 pg (ref 26.0–34.0)
MCHC: 34.6 g/dL (ref 30.0–36.0)
MCV: 95 fL (ref 80.0–100.0)
Monocytes Absolute: 0.3 10*3/uL (ref 0.1–1.0)
Monocytes Relative: 7 %
Neutro Abs: 2.5 10*3/uL (ref 1.7–7.7)
Neutrophils Relative %: 60 %
Platelet Count: 167 10*3/uL (ref 150–400)
RBC: 4.02 MIL/uL (ref 3.87–5.11)
RDW: 12.5 % (ref 11.5–15.5)
WBC Count: 4.2 10*3/uL (ref 4.0–10.5)
nRBC: 0 % (ref 0.0–0.2)

## 2023-01-13 LAB — CMP (CANCER CENTER ONLY)
ALT: 15 U/L (ref 0–44)
AST: 20 U/L (ref 15–41)
Albumin: 4.4 g/dL (ref 3.5–5.0)
Alkaline Phosphatase: 84 U/L (ref 38–126)
Anion gap: 5 (ref 5–15)
BUN: 14 mg/dL (ref 8–23)
CO2: 29 mmol/L (ref 22–32)
Calcium: 9.7 mg/dL (ref 8.9–10.3)
Chloride: 106 mmol/L (ref 98–111)
Creatinine: 0.86 mg/dL (ref 0.44–1.00)
GFR, Estimated: >60 mL/min (ref >60)
Glucose, Bld: 89 mg/dL (ref 70–99)
Potassium: 4.6 mmol/L (ref 3.5–5.1)
Sodium: 140 mmol/L (ref 135–145)
Total Bilirubin: 0.7 mg/dL (ref 0.3–1.2)
Total Protein: 6.6 g/dL (ref 6.5–8.1)

## 2023-01-13 LAB — SAVE SMEAR(SSMR), FOR PROVIDER SLIDE REVIEW

## 2023-01-13 NOTE — Addendum Note (Signed)
Addended by: Melton Krebs on: 01/13/2023 03:55 PM   Modules accepted: Orders

## 2023-01-13 NOTE — Progress Notes (Addendum)
Hematology and Oncology Follow Up Visit  Sara Sanford [id number] 26-Oct-1956 66 y.o. 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 She is doing well but does note intermittent right upper quadrant abdominal pain and bloating. This comes and goes. She will let us know if she would like to move forward with CT scans.  No fever, chills, n/v, cough, rash, dizziness, SOB, chest pain, palpitations, abdominal pain or changes in bowel or bladder habits at this time.  No blood loss, bruising or petechiae.  No falls or syncope reported.  Appetite and hydration are good. Weight is stable at 239 lbs.   ECOG Performance Status: 1 - Symptomatic but completely ambulatory  Medications:  Allergies as of 01/13/2023       Reactions   Propoxyphene Anaphylaxis, Shortness Of Breath, Other (See Comments)   Hallucinations   Keflex [cephalexin] Other (See Comments)   Diarrrhea   Nitrofurantoin Diarrhea, Rash   Welts with blisters        Medication List        Accurate as of January 13, 2023  2:22 PM. If you have any questions, ask your nurse or doctor.          clobetasol ointment 0.05 % Commonly known as: TEMOVATE Apply to affected area every night for 4 weeks, then every other day for 4 weeks and then twice a week for 4 weeks or until resolution. May also take prn   cyanocobalamin 1000 MCG tablet Commonly known as: VITAMIN B12 Take 1,000 mcg by mouth daily.   FLUoxetine 20 MG  capsule Commonly known as: PROZAC TAKE 1 CAPSULE BY MOUTH DAILY   folic acid 1 MG tablet Commonly known as: FOLVITE TAKE TWO TABLETS BY MOUTH DAILY   nitroGLYCERIN 0.4 MG SL tablet Commonly known as: NITROSTAT Place 1 tablet (0.4 mg total) under the tongue every 5 (five) minutes as needed.   Venclexta 100 MG tablet Generic drug: venetoclax TAKE 2 TABLETS (200 MG) BY MOUTH DAILY.        Allergies:  Allergies  Allergen Reactions   Propoxyphene Anaphylaxis, Shortness Of Breath and Other (See Comments)    Hallucinations    Keflex [Cephalexin] Other (See Comments)    Diarrrhea   Nitrofurantoin Diarrhea and Rash    Welts with blisters    Past Medical History, Surgical history, Social history, and Family History were reviewed and updated.  Review of Systems: All other 10 point review of systems is negative.   Physical Exam:  vitals were not taken for this visit.  Wt Readings from Last 3 Encounters:  10/10/22 235 lb (106.6 kg)  10/09/22 234 lb (106.1 kg)  07/15/22 232 lb (105.2 kg)    Ocular: Sclerae unicteric, pupils equal, round and reactive to light Ear-nose-throat: Oropharynx clear, dentition fair Lymphatic: No cervical or supraclavicular adenopathy Lungs no rales or rhonchi, good excursion bilaterally Heart regular rate and rhythm, no murmur appreciated Abd soft, nontender, positive bowel sounds MSK no focal spinal tenderness, no joint edema Neuro: non-focal, well-oriented, appropriate affect Breasts: Deferred   Lab Results  Component Value Date   WBC 4.2 01/13/2023   HGB 13.2 01/13/2023   HCT 38.2 01/13/2023   MCV 95.0 01/13/2023   PLT 167 01/13/2023   Lab Results  Component Value Date   FERRITIN 25 05/15/2020   IRON 89 05/15/2020   TIBC 344 05/15/2020   UIBC 255 05/15/2020   IRONPCTSAT 26 05/15/2020   Lab Results  Component Value Date   RETICCTPCT 2.5 05/15/2020   RBC 4.02 01/13/2023   Lab Results  Component Value Date   KPAFRELGTCHN 7.7  07/15/2022   LAMBDASER 2.8 (L) 07/15/2022   KAPLAMBRATIO 2.75 (H) 07/15/2022   Lab Results  Component Value Date   IGGSERUM 392 (L) 10/09/2022   IGA 17 (L) 10/09/2022   IGMSERUM 54 10/09/2022   Lab Results  Component Value Date   TOTALPROTELP 5.5 (L) 10/18/2021   ALBUMINELP 3.5 10/18/2021   A1GS 0.2 10/18/2021   A2GS 0.6 10/18/2021   BETS 0.9 10/18/2021   GAMS 0.4 10/18/2021   MSPIKE Not Observed 10/18/2021     Chemistry      Component Value Date/Time   NA 140 01/13/2023 1313   K 4.6 01/13/2023 1313   CL 106 01/13/2023 1313   CO2 29 01/13/2023 1313   BUN 14 01/13/2023 1313   CREATININE 0.86 01/13/2023 1313   CREATININE 0.69 02/01/2020 1149      Component Value Date/Time   CALCIUM 9.7 01/13/2023 1313   ALKPHOS 84 01/13/2023 1313   AST 20 01/13/2023 1313   ALT 15 01/13/2023 1313   BILITOT 0.7 01/13/2023 1313       Impression and Plan: Sara Sanford is a very pleasant 67 yo caucasian female with CLL. She continues to do well. Her counts remain stable.  She will continue her same regimen with daily folic acid and Venetoclax.  Follow-up in 3 months.   Lottie Dawson, NP 2/5/20242:22 PM

## 2023-01-14 ENCOUNTER — Other Ambulatory Visit (HOSPITAL_COMMUNITY): Payer: Self-pay

## 2023-02-05 ENCOUNTER — Other Ambulatory Visit (HOSPITAL_COMMUNITY): Payer: Self-pay

## 2023-02-18 ENCOUNTER — Other Ambulatory Visit (HOSPITAL_COMMUNITY): Payer: Self-pay

## 2023-02-19 ENCOUNTER — Other Ambulatory Visit (HOSPITAL_COMMUNITY): Payer: Self-pay

## 2023-04-14 ENCOUNTER — Encounter: Payer: Self-pay | Admitting: Medical Oncology

## 2023-04-14 ENCOUNTER — Inpatient Hospital Stay: Payer: Medicare Other | Attending: Family

## 2023-04-14 ENCOUNTER — Inpatient Hospital Stay (HOSPITAL_BASED_OUTPATIENT_CLINIC_OR_DEPARTMENT_OTHER): Payer: Medicare Other | Admitting: Medical Oncology

## 2023-04-14 VITALS — BP 99/39 | HR 48 | Temp 98.2°F | Resp 18 | Wt 243.0 lb

## 2023-04-14 DIAGNOSIS — R1011 Right upper quadrant pain: Secondary | ICD-10-CM | POA: Diagnosis not present

## 2023-04-14 DIAGNOSIS — C9110 Chronic lymphocytic leukemia of B-cell type not having achieved remission: Secondary | ICD-10-CM | POA: Insufficient documentation

## 2023-04-14 DIAGNOSIS — Z881 Allergy status to other antibiotic agents status: Secondary | ICD-10-CM | POA: Diagnosis not present

## 2023-04-14 DIAGNOSIS — R140 Abdominal distension (gaseous): Secondary | ICD-10-CM | POA: Diagnosis not present

## 2023-04-14 DIAGNOSIS — Z79899 Other long term (current) drug therapy: Secondary | ICD-10-CM | POA: Diagnosis not present

## 2023-04-14 DIAGNOSIS — Z7969 Long term (current) use of other immunomodulators and immunosuppressants: Secondary | ICD-10-CM | POA: Diagnosis not present

## 2023-04-14 LAB — CMP (CANCER CENTER ONLY)
ALT: 12 U/L (ref 0–44)
AST: 17 U/L (ref 15–41)
Albumin: 4.3 g/dL (ref 3.5–5.0)
Alkaline Phosphatase: 80 U/L (ref 38–126)
Anion gap: 10 (ref 5–15)
BUN: 13 mg/dL (ref 8–23)
CO2: 25 mmol/L (ref 22–32)
Calcium: 8.9 mg/dL (ref 8.9–10.3)
Chloride: 106 mmol/L (ref 98–111)
Creatinine: 0.74 mg/dL (ref 0.44–1.00)
GFR, Estimated: >60 mL/min (ref >60)
Glucose, Bld: 95 mg/dL (ref 70–99)
Potassium: 4.2 mmol/L (ref 3.5–5.1)
Sodium: 141 mmol/L (ref 135–145)
Total Bilirubin: 0.6 mg/dL (ref 0.3–1.2)
Total Protein: 6.3 g/dL — ABNORMAL LOW (ref 6.5–8.1)

## 2023-04-14 LAB — CBC WITH DIFFERENTIAL (CANCER CENTER ONLY)
Abs Immature Granulocytes: 0.01 10*3/uL (ref 0.00–0.07)
Basophils Absolute: 0 10*3/uL (ref 0.0–0.1)
Basophils Relative: 0 %
Eosinophils Absolute: 0 10*3/uL (ref 0.0–0.5)
Eosinophils Relative: 0 %
HCT: 37.1 % (ref 36.0–46.0)
Hemoglobin: 12.9 g/dL (ref 12.0–15.0)
Immature Granulocytes: 0 %
Lymphocytes Relative: 36 %
Lymphs Abs: 1.5 10*3/uL (ref 0.7–4.0)
MCH: 33 pg (ref 26.0–34.0)
MCHC: 34.8 g/dL (ref 30.0–36.0)
MCV: 94.9 fL (ref 80.0–100.0)
Monocytes Absolute: 0.3 10*3/uL (ref 0.1–1.0)
Monocytes Relative: 7 %
Neutro Abs: 2.4 10*3/uL (ref 1.7–7.7)
Neutrophils Relative %: 57 %
Platelet Count: 160 10*3/uL (ref 150–400)
RBC: 3.91 MIL/uL (ref 3.87–5.11)
RDW: 12.5 % (ref 11.5–15.5)
WBC Count: 4.2 10*3/uL (ref 4.0–10.5)
nRBC: 0 % (ref 0.0–0.2)

## 2023-04-14 LAB — SAVE SMEAR(SSMR), FOR PROVIDER SLIDE REVIEW

## 2023-04-14 LAB — LACTATE DEHYDROGENASE: LDH: 194 U/L — ABNORMAL HIGH (ref 98–192)

## 2023-04-14 NOTE — Progress Notes (Signed)
Hematology and Oncology Follow Up Visit  Sara Sanford [id number] Jun 18, 1956 67 y.o. 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 We last saw her back 3 months ago in Feb. At that time she reported intermittent right upper quadrant abdominal pain and bloating. She states that this has not worsened nor has become more frequent. She does not wish to obtain imaging at this time. Other than this she is doing well.   No fever, chills, n/v, cough, rash, dizziness, SOB, chest pain, palpitations, abdominal pain or changes in bowel or bladder habits at this time.  No blood loss, bruising or petechiae.  No falls or syncope reported.  Appetite and hydration are good.   Wt Readings from Last 3 Encounters:  04/14/23 243 lb (110.2 kg)  01/13/23 239 lb (108.4 kg)  10/10/22 235 lb (106.6 kg)    ECOG Performance Status: 1 - Symptomatic but completely ambulatory  Medications:  Allergies as of 04/14/2023       Reactions   Propoxyphene Anaphylaxis, Shortness Of Breath, Other (See Comments)   Hallucinations   Keflex [cephalexin] Other (See Comments)   Diarrrhea   Nitrofurantoin Diarrhea, Rash   Welts with blisters        Medication List        Accurate as of Apr 14, 2023  2:54 PM. If you have any questions, ask your nurse or doctor.          clobetasol ointment 0.05 % Commonly known as: TEMOVATE Apply to affected area every night for 4 weeks, then every other day for 4 weeks and  then twice a week for 4 weeks or until resolution. May also take prn   cyanocobalamin 1000 MCG tablet Commonly known as: VITAMIN B12 Take 1,000 mcg by mouth daily.   FLUoxetine 20 MG capsule Commonly known as: PROZAC TAKE 1 CAPSULE BY MOUTH DAILY   folic acid 1 MG tablet Commonly known as: FOLVITE TAKE TWO TABLETS BY MOUTH DAILY   nitroGLYCERIN 0.4 MG SL tablet Commonly known as: NITROSTAT Place 1 tablet (0.4 mg total) under the tongue every 5 (five) minutes as needed.   omeprazole 20 MG capsule Commonly known as: PRILOSEC Take 20 mg by mouth every other day.   Venclexta 100 MG tablet Generic drug: venetoclax TAKE 2 TABLETS (200 MG) BY MOUTH DAILY.        Allergies:  Allergies  Allergen Reactions   Propoxyphene Anaphylaxis, Shortness Of Breath and Other (See Comments)    Hallucinations    Keflex [Cephalexin] Other (See Comments)  Diarrrhea   Nitrofurantoin Diarrhea and Rash    Welts with blisters    Past Medical History, Surgical history, Social history, and Family History were reviewed and updated.  Review of Systems: All other 10 point review of systems is negative.   Physical Exam:  weight is 243 lb (110.2 kg). Her oral temperature is 98.2 F (36.8 C). Her blood pressure is 99/39 (abnormal) and her pulse is 48 (abnormal). Her respiration is 18 and oxygen saturation is 99%.   Wt Readings from Last 3 Encounters:  04/14/23 243 lb (110.2 kg)  01/13/23 239 lb (108.4 kg)  10/10/22 235 lb (106.6 kg)    Ocular: Sclerae unicteric, pupils equal, round and reactive to light Ear-nose-throat: Oropharynx clear, dentition fair Lymphatic: No cervical or supraclavicular adenopathy Lungs no rales or rhonchi, good excursion bilaterally Heart regular rate and rhythm, no murmur appreciated Abd soft, nontender, positive bowel sounds MSK no focal spinal tenderness, no joint edema Neuro: non-focal, well-oriented, appropriate affect Breasts: Deferred   Lab Results   Component Value Date   WBC 4.2 04/14/2023   HGB 12.9 04/14/2023   HCT 37.1 04/14/2023   MCV 94.9 04/14/2023   PLT 160 04/14/2023   Lab Results  Component Value Date   FERRITIN 25 05/15/2020   IRON 89 05/15/2020   TIBC 344 05/15/2020   UIBC 255 05/15/2020   IRONPCTSAT 26 05/15/2020   Lab Results  Component Value Date   RETICCTPCT 2.5 05/15/2020   RBC 3.91 04/14/2023   Lab Results  Component Value Date   KPAFRELGTCHN 7.7 07/15/2022   LAMBDASER 2.8 (L) 07/15/2022   KAPLAMBRATIO 2.75 (H) 07/15/2022   Lab Results  Component Value Date   IGGSERUM 392 (L) 10/09/2022   IGA 17 (L) 10/09/2022   IGMSERUM 54 10/09/2022   Lab Results  Component Value Date   TOTALPROTELP 5.5 (L) 10/18/2021   ALBUMINELP 3.5 10/18/2021   A1GS 0.2 10/18/2021   A2GS 0.6 10/18/2021   BETS 0.9 10/18/2021   GAMS 0.4 10/18/2021   MSPIKE Not Observed 10/18/2021     Chemistry      Component Value Date/Time   NA 140 01/13/2023 1313   K 4.6 01/13/2023 1313   CL 106 01/13/2023 1313   CO2 29 01/13/2023 1313   BUN 14 01/13/2023 1313   CREATININE 0.86 01/13/2023 1313   CREATININE 0.69 02/01/2020 1149      Component Value Date/Time   CALCIUM 9.7 01/13/2023 1313   ALKPHOS 84 01/13/2023 1313   AST 20 01/13/2023 1313   ALT 15 01/13/2023 1313   BILITOT 0.7 01/13/2023 1313       Impression and Plan: Sara Sanford is a very pleasant 67 yo caucasian female with CLL. She continues to do well. Her counts remain stable. She does not wish to undergo imaging for her chronic and unchanged pain at this time. We discussed red flags.   CBC stable at this time. Discussed her vitals; she will try to hydrate a bit more. Additional CLL labs pending at this time.  Continue PCP follow up.  She will continue her same regimen with daily folic acid and Venetoclax.   Disposition: RTC 3 months APP/MD, labs (CBC w/, CMP, SPEP, save smear, LDH, light chains, MM panel)-[place]   Rushie Chestnut, PA-C 5/6/20242:54 PM

## 2023-04-15 ENCOUNTER — Other Ambulatory Visit (HOSPITAL_COMMUNITY): Payer: Self-pay

## 2023-04-15 LAB — KAPPA/LAMBDA LIGHT CHAINS
Kappa free light chain: 36.7 mg/L — ABNORMAL HIGH (ref 3.3–19.4)
Kappa, lambda light chain ratio: 10.19 — ABNORMAL HIGH (ref 0.26–1.65)
Lambda free light chains: 3.6 mg/L — ABNORMAL LOW (ref 5.7–26.3)

## 2023-04-16 LAB — IGG, IGA, IGM
IgA: 17 mg/dL — ABNORMAL LOW (ref 87–352)
IgG (Immunoglobin G), Serum: 433 mg/dL — ABNORMAL LOW (ref 586–1602)
IgM (Immunoglobulin M), Srm: 68 mg/dL (ref 26–217)

## 2023-04-18 LAB — PROTEIN ELECTROPHORESIS, SERUM
A/G Ratio: 1.7 (ref 0.7–1.7)
Albumin ELP: 4 g/dL (ref 2.9–4.4)
Alpha-1-Globulin: 0.2 g/dL (ref 0.0–0.4)
Alpha-2-Globulin: 0.7 g/dL (ref 0.4–1.0)
Beta Globulin: 1 g/dL (ref 0.7–1.3)
Gamma Globulin: 0.5 g/dL (ref 0.4–1.8)
Globulin, Total: 2.3 g/dL (ref 2.2–3.9)
Total Protein ELP: 6.3 g/dL (ref 6.0–8.5)

## 2023-04-22 ENCOUNTER — Other Ambulatory Visit (HOSPITAL_COMMUNITY): Payer: Self-pay

## 2023-04-22 ENCOUNTER — Other Ambulatory Visit: Payer: Self-pay

## 2023-05-07 ENCOUNTER — Other Ambulatory Visit (HOSPITAL_COMMUNITY): Payer: Self-pay

## 2023-05-13 ENCOUNTER — Other Ambulatory Visit: Payer: Self-pay | Admitting: Hematology & Oncology

## 2023-05-13 ENCOUNTER — Other Ambulatory Visit (HOSPITAL_COMMUNITY): Payer: Self-pay

## 2023-05-14 ENCOUNTER — Other Ambulatory Visit: Payer: Self-pay

## 2023-05-14 ENCOUNTER — Other Ambulatory Visit (HOSPITAL_COMMUNITY): Payer: Self-pay

## 2023-05-14 MED ORDER — VENETOCLAX 100 MG PO TABS
ORAL_TABLET | ORAL | 3 refills | Status: DC
  Filled 2023-05-14: qty 56, 28d supply, fill #0
  Filled 2023-06-13: qty 56, 28d supply, fill #1

## 2023-05-15 ENCOUNTER — Other Ambulatory Visit (HOSPITAL_COMMUNITY): Payer: Self-pay

## 2023-05-29 ENCOUNTER — Other Ambulatory Visit: Payer: Self-pay | Admitting: Hematology & Oncology

## 2023-05-29 ENCOUNTER — Other Ambulatory Visit: Payer: Self-pay | Admitting: Family Medicine

## 2023-06-11 ENCOUNTER — Other Ambulatory Visit (HOSPITAL_COMMUNITY): Payer: Self-pay

## 2023-06-13 ENCOUNTER — Other Ambulatory Visit (HOSPITAL_COMMUNITY): Payer: Self-pay

## 2023-06-17 ENCOUNTER — Other Ambulatory Visit (HOSPITAL_COMMUNITY): Payer: Self-pay

## 2023-07-14 ENCOUNTER — Other Ambulatory Visit: Payer: Self-pay | Admitting: Medical Oncology

## 2023-07-14 ENCOUNTER — Emergency Department (HOSPITAL_BASED_OUTPATIENT_CLINIC_OR_DEPARTMENT_OTHER)
Admission: EM | Admit: 2023-07-14 | Discharge: 2023-07-14 | Disposition: A | Discharge status: Home / Self Care | Payer: Medicare Other | Source: Home / Self Care | Attending: Emergency Medicine | Admitting: Emergency Medicine

## 2023-07-14 ENCOUNTER — Encounter: Payer: Self-pay | Admitting: Medical Oncology

## 2023-07-14 ENCOUNTER — Other Ambulatory Visit: Payer: Self-pay

## 2023-07-14 ENCOUNTER — Inpatient Hospital Stay: Payer: Medicare Other | Attending: Family

## 2023-07-14 ENCOUNTER — Inpatient Hospital Stay (HOSPITAL_BASED_OUTPATIENT_CLINIC_OR_DEPARTMENT_OTHER): Payer: Medicare Other | Admitting: Medical Oncology

## 2023-07-14 ENCOUNTER — Emergency Department (HOSPITAL_BASED_OUTPATIENT_CLINIC_OR_DEPARTMENT_OTHER): Payer: Medicare Other

## 2023-07-14 VITALS — BP 104/38 | HR 70 | Temp 98.0°F | Resp 19 | Ht 64.0 in | Wt 245.0 lb

## 2023-07-14 DIAGNOSIS — R5383 Other fatigue: Secondary | ICD-10-CM | POA: Diagnosis not present

## 2023-07-14 DIAGNOSIS — C9110 Chronic lymphocytic leukemia of B-cell type not having achieved remission: Secondary | ICD-10-CM

## 2023-07-14 DIAGNOSIS — I491 Atrial premature depolarization: Secondary | ICD-10-CM | POA: Diagnosis not present

## 2023-07-14 DIAGNOSIS — Z856 Personal history of leukemia: Secondary | ICD-10-CM | POA: Insufficient documentation

## 2023-07-14 DIAGNOSIS — I499 Cardiac arrhythmia, unspecified: Secondary | ICD-10-CM | POA: Diagnosis not present

## 2023-07-14 DIAGNOSIS — R799 Abnormal finding of blood chemistry, unspecified: Secondary | ICD-10-CM | POA: Diagnosis present

## 2023-07-14 DIAGNOSIS — R079 Chest pain, unspecified: Secondary | ICD-10-CM | POA: Diagnosis not present

## 2023-07-14 LAB — CBC WITH DIFFERENTIAL (CANCER CENTER ONLY)
Abs Immature Granulocytes: 0.01 10*3/uL (ref 0.00–0.07)
Basophils Absolute: 0 10*3/uL (ref 0.0–0.1)
Basophils Relative: 0 %
Eosinophils Absolute: 0 10*3/uL (ref 0.0–0.5)
Eosinophils Relative: 1 %
HCT: 37.2 % (ref 36.0–46.0)
Hemoglobin: 12.7 g/dL (ref 12.0–15.0)
Immature Granulocytes: 0 %
Lymphocytes Relative: 38 %
Lymphs Abs: 1.5 10*3/uL (ref 0.7–4.0)
MCH: 32.7 pg (ref 26.0–34.0)
MCHC: 34.1 g/dL (ref 30.0–36.0)
MCV: 95.9 fL (ref 80.0–100.0)
Monocytes Absolute: 0.3 10*3/uL (ref 0.1–1.0)
Monocytes Relative: 8 %
Neutro Abs: 2.1 10*3/uL (ref 1.7–7.7)
Neutrophils Relative %: 53 %
Platelet Count: 149 10*3/uL — ABNORMAL LOW (ref 150–400)
RBC: 3.88 MIL/uL (ref 3.87–5.11)
RDW: 12.5 % (ref 11.5–15.5)
WBC Count: 4 10*3/uL (ref 4.0–10.5)
nRBC: 0 % (ref 0.0–0.2)

## 2023-07-14 LAB — CMP (CANCER CENTER ONLY)
ALT: 14 U/L (ref 0–44)
AST: 18 U/L (ref 15–41)
Albumin: 4.1 g/dL (ref 3.5–5.0)
Alkaline Phosphatase: 76 U/L (ref 38–126)
Anion gap: 6 (ref 5–15)
BUN: 12 mg/dL (ref 8–23)
CO2: 29 mmol/L (ref 22–32)
Calcium: 8.6 mg/dL — ABNORMAL LOW (ref 8.9–10.3)
Chloride: 106 mmol/L (ref 98–111)
Creatinine: 0.78 mg/dL (ref 0.44–1.00)
GFR, Estimated: >60 mL/min (ref >60)
Glucose, Bld: 92 mg/dL (ref 70–99)
Potassium: 4.3 mmol/L (ref 3.5–5.1)
Sodium: 141 mmol/L (ref 135–145)
Total Bilirubin: 0.6 mg/dL (ref 0.3–1.2)
Total Protein: 6.2 g/dL — ABNORMAL LOW (ref 6.5–8.1)

## 2023-07-14 LAB — CBC
HCT: 35.2 % — ABNORMAL LOW (ref 36.0–46.0)
Hemoglobin: 12.4 g/dL (ref 12.0–15.0)
MCH: 33.2 pg (ref 26.0–34.0)
MCHC: 35.2 g/dL (ref 30.0–36.0)
MCV: 94.4 fL (ref 80.0–100.0)
Platelets: 144 10*3/uL — ABNORMAL LOW (ref 150–400)
RBC: 3.73 MIL/uL — ABNORMAL LOW (ref 3.87–5.11)
RDW: 12.5 % (ref 11.5–15.5)
WBC: 4.2 10*3/uL (ref 4.0–10.5)
nRBC: 0 % (ref 0.0–0.2)

## 2023-07-14 LAB — BASIC METABOLIC PANEL
Anion gap: 10 (ref 5–15)
BUN: 12 mg/dL (ref 8–23)
CO2: 23 mmol/L (ref 22–32)
Calcium: 8.6 mg/dL — ABNORMAL LOW (ref 8.9–10.3)
Chloride: 105 mmol/L (ref 98–111)
Creatinine, Ser: 0.71 mg/dL (ref 0.44–1.00)
GFR, Estimated: >60 mL/min (ref >60)
Glucose, Bld: 97 mg/dL (ref 70–99)
Potassium: 4.4 mmol/L (ref 3.5–5.1)
Sodium: 138 mmol/L (ref 135–145)

## 2023-07-14 LAB — LACTATE DEHYDROGENASE: LDH: 211 U/L — ABNORMAL HIGH (ref 98–192)

## 2023-07-14 LAB — SAVE SMEAR(SSMR), FOR PROVIDER SLIDE REVIEW

## 2023-07-14 LAB — TROPONIN I (HIGH SENSITIVITY): Troponin I (High Sensitivity): <2 ng/L (ref <18)

## 2023-07-14 NOTE — ED Provider Notes (Signed)
[place] [facility] AT [facility] Provider Note   CSN: [id number] Arrival date & time: 07/14/23  1228     History  Chief Complaint  Patient presents with   Abnormal ECG    Sara Sanford is a 67 y.o. female with a history of chronic lymphocytic leukemia, abdominal aortic aneurysm, and GERD who presents to the ED today for an abnormal EKG and arrhythmia om auscultation.  Her EKG at oncology appointment earlier today showed sinus rhythm with premature supraventricular complexes with a heart rate of 65 bpm. Patient denies any chest pain, shortness of breath, back or abdominal pain at this time.  She admits that this morning she experienced squeezing pain localized to the left side of her chest that resolved on its own within 30 minutes. Patient has been evaluated by cardiology in the past for the same complaint, and they ruled out any cardiac etiology. Patient reports a history of arrhythmia as well.  She denies any other complaints or concerns at this time.    Home Medications Prior to Admission medications   Medication Sig Start Date End Date Taking? Authorizing Provider  clobetasol ointment (TEMOVATE) 0.05 % Apply to affected area every night for 4 weeks, then every other day for 4 weeks and then twice a week for 4 weeks or until resolution. May also take prn 10/10/22   Milas Hock, MD  FLUoxetine (PROZAC) 20 MG capsule TAKE ONE CAPSULE BY MOUTH DAILY 05/29/23   Agapito Games, MD  folic acid (FOLVITE) 1 MG tablet TAKE TWO TABLETS BY MOUTH DAILY 05/29/23   Josph Macho, MD  nitroGLYCERIN (NITROSTAT) 0.4 MG SL tablet Place 1 tablet (0.4 mg total) under the tongue every 5 (five) minutes as needed. 10/11/21 07/24/22  Revankar, Aundra Dubin, MD  omeprazole (PRILOSEC) 20 MG capsule Take 20 mg by mouth every other day.    [provider]  venetoclax (VENCLEXTA) 100 MG tablet TAKE 2 TABLETS (200 MG) BY MOUTH DAILY. 05/14/23 05/13/24  Josph Macho, MD  vitamin  B-12 (CYANOCOBALAMIN) 1000 MCG tablet Take 1,000 mcg by mouth daily.    [provider]      Allergies    Propoxyphene, Keflex [cephalexin], and Nitrofurantoin    Review of Systems   Review of Systems  Cardiovascular:        Arrhythmia and abnormal EKG at oncology appointment earlier today  All other systems reviewed and are negative.   Physical Exam Updated Vital Signs BP 93/70   Pulse (!) 56   Temp (!) 97.5 F (36.4 C)   Resp 14   Ht 5\' 3"  (1.6 m)   Wt 108.9 kg   SpO2 98%   BMI 42.51 kg/m  Physical Exam Vitals and nursing note reviewed.  Constitutional:      General: She is not in acute distress.    Appearance: Normal appearance.  HENT:     Head: Normocephalic and atraumatic.     Mouth/Throat:     Mouth: Mucous membranes are moist.  Eyes:     Conjunctiva/sclera: Conjunctivae normal.     Pupils: Pupils are equal, round, and reactive to light.  Cardiovascular:     Rate and Rhythm: Normal rate and regular rhythm.     Pulses: Normal pulses.     Heart sounds: Normal heart sounds.  Pulmonary:     Effort: Pulmonary effort is normal.     Breath sounds: Normal breath sounds.  Abdominal:     Palpations: Abdomen is soft.  Tenderness: There is no abdominal tenderness.  Musculoskeletal:     Right lower leg: No edema.     Left lower leg: Edema present.  Skin:    General: Skin is warm and dry.     Findings: No rash.  Neurological:     General: No focal deficit present.     Mental Status: She is alert.     Sensory: No sensory deficit.     Motor: No weakness.  Psychiatric:        Mood and Affect: Mood normal.        Behavior: Behavior normal.     ED Results / Procedures / Treatments   Labs (all labs ordered are listed, but only abnormal results are displayed) Labs Reviewed  BASIC METABOLIC PANEL - Abnormal; Notable for the following components:      Result Value   Calcium 8.6 (*)    All other components within normal limits  CBC - Abnormal; Notable  for the following components:   RBC 3.73 (*)    HCT 35.2 (*)    Platelets 144 (*)    All other components within normal limits  TROPONIN I (HIGH SENSITIVITY)    EKG None  Radiology DG Chest 2 View  Result Date: 07/14/2023 CLINICAL DATA:  Chest pain.  Irregular heartbeat. EXAM: CHEST - 2 VIEW COMPARISON:  Chest radiographs 05/22/2020 and 08/09/2019 FINDINGS: Cardiac silhouette and mediastinal contours are within limits. The lungs are clear. No pulmonary edema, pleural effusion, or pneumothorax. Moderate multilevel degenerative disc changes of the midthoracic spine. Cholecystectomy clips. IMPRESSION: No active cardiopulmonary disease. Electronically Signed   By: Neita Garnet M.D.   On: 07/14/2023 15:13    Procedures Procedures: not indicated.   Medications Ordered in ED Medications - No data to display  ED Course/ Medical Decision Making/ A&P                                 Medical Decision Making  This patient presents to the ED for concern of abnormal EKG and arrhythmia, this involves an extensive number of treatment options, and is a complaint that carries with it a high risk of complications and morbidity.   Differential diagnosis includes: ACS, pleurisy, costochondritis, atypical chest pain, GERD,    Co morbidities that complicate the patient evaluation  CLL GERD Bilateral leg edema Microcytic anemia Hyperlipidemia Abdominal aortic aneurysm   Additional history  Additional history obtained from patient's records.   Cardiac Monitoring / EKG  The patient was maintained on a cardiac monitor.  I personally viewed and interpreted the cardiac monitored which showed: normal sinus rhythm with a heart rate of 58 bpm.   Lab Tests  I ordered and personally interpreted labs.  The pertinent results include:   BMP and CBC are within normal limits - no acute infection, anemia, electrolyte abnormality, or AKI.   Imaging Studies  I ordered imaging studies including CXR   I independently visualized and interpreted imaging which showed: No active cardiopulmonary processes I agree with the radiologist interpretation   Problem List / ED Course / Critical interventions / Medication management  Abnormal EKG and arrhythmia at oncology appointment earlier today I have reviewed the patients home medicines and have made adjustments as needed   Social Determinants of Health  Physical activity   Test / Admission - Considered  Discussed results with patient She is stable and safe for discharge home Cardiology referral provided for  outpatient follow-up. Return precautions provided       Final Clinical Impression(s) / ED Diagnoses Final diagnoses:  Chest pain of uncertain etiology  Premature atrial contractions    Rx / DC Orders ED Discharge Orders          Ordered    Ambulatory referral to Cardiology       Comments: If you have not heard from the Cardiology office within the next 72 hours please call [phone].   07/14/23 1548              Maxwell Marion, PA-C 07/14/23 1557    Rolan Bucco, MD 07/15/23 [phone]

## 2023-07-14 NOTE — ED Notes (Signed)
Discharge instructions reviewed with patient. Patient questions answered and opportunity for education reviewed. Patient voices understanding of discharge instructions with no further questions. Patient ambulatory with steady gait to lobby.  

## 2023-07-14 NOTE — ED Triage Notes (Signed)
Patient presents to ED via POV from PCP office. Sent due to irregular heart beat. Patient states "they listened to my heart and told me to come down here". Denies any complaints. Well appearing.

## 2023-07-14 NOTE — Discharge Instructions (Addendum)
As discussed, your EKG here in the ED was unremarkable.  It did not show any premature contractions, like your EKG and your oncology office earlier today.  Your labs do not indicate any signs of heart strain or injury as the cause of your chest pain earlier today.  I have sent a referral for cardiology office in [place] electronically.  They will call you in several days to make follow-up appointment.   In the interim, you can follow-up with your primary care for reevaluation of your symptoms they persist prior to you being able to see cardiology.  Get help right away if: Your chest pain gets worse. You have a cough that gets worse, or you cough up blood. You have severe pain in your abdomen. You faint. You have sudden, unexplained chest discomfort. You have sudden, unexplained discomfort in your arms, back, neck, or jaw. You have shortness of breath at any time. You suddenly start to sweat, or your skin gets clammy. You feel nausea or you vomit. You suddenly feel lightheaded or dizzy. You have severe weakness, or unexplained weakness or fatigue. Your heart begins to beat quickly, or it feels like it is skipping beats.

## 2023-07-14 NOTE — Progress Notes (Signed)
Hematology and Oncology Follow Up Visit  Sara Sanford [id number] 1956-07-13 67 y.o. 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 We last saw her back 3 months ago in May 2024.  She reports that she has been ok. Still has chronic fatigue. This morning she had chest pain about an hour before her arrival. This felt like a squeezing sensation. No SOB but was dizzy during this encounter. Has been having more fatigue recently. Some palpitations.   No fever, chills, n/v, cough, rash, abdominal pain or changes in bowel or bladder habits at this time.  No blood loss, bruising or petechiae.  No falls or syncope reported.  Appetite and hydration are good.   Wt Readings from Last 3 Encounters:  07/14/23 245 lb (111.1 kg)  04/14/23 243 lb (110.2 kg)  01/13/23 239 lb (108.4 kg)    ECOG Performance Status: 1 - Symptomatic but completely ambulatory  Medications:  Allergies as of 07/14/2023       Reactions   Propoxyphene Anaphylaxis, Shortness Of Breath, Other (See Comments)   Hallucinations   Keflex [cephalexin] Diarrhea   Nitrofurantoin Diarrhea, Rash   Welts with blisters        Medication List        Accurate as of July 14, 2023 11:48 AM. If you have any questions, ask your nurse or doctor.          clobetasol ointment 0.05 % Commonly known as: TEMOVATE Apply to affected area every night for 4 weeks, then every other day for 4 weeks and then twice a week for 4 weeks or  until resolution. May also take prn   cyanocobalamin 1000 MCG tablet Commonly known as: VITAMIN B12 Take 1,000 mcg by mouth daily.   FLUoxetine 20 MG capsule Commonly known as: PROZAC TAKE ONE CAPSULE BY MOUTH DAILY   folic acid 1 MG tablet Commonly known as: FOLVITE TAKE TWO TABLETS BY MOUTH DAILY   nitroGLYCERIN 0.4 MG SL tablet Commonly known as: NITROSTAT Place 1 tablet (0.4 mg total) under the tongue every 5 (five) minutes as needed.   omeprazole 20 MG capsule Commonly known as: PRILOSEC Take 20 mg by mouth every other day.   Venclexta 100 MG tablet Generic drug: venetoclax TAKE 2 TABLETS (200 MG) BY MOUTH DAILY.        Allergies:  Allergies  Allergen Reactions   Propoxyphene Anaphylaxis, Shortness Of Breath and Other (See Comments)    Hallucinations    Keflex [Cephalexin] Diarrhea   Nitrofurantoin Diarrhea and Rash  Welts with blisters    Past Medical History, Surgical history, Social history, and Family History were reviewed and updated.  Review of Systems: All other 10 point review of systems is negative.   Physical Exam:  height is 5\' 4"  (1.626 m) and weight is 245 lb (111.1 kg). Her oral temperature is 98 F (36.7 C). Her blood pressure is 104/38 (abnormal) and her pulse is 70. Her respiration is 19 and oxygen saturation is 99%.   Wt Readings from Last 3 Encounters:  07/14/23 245 lb (111.1 kg)  04/14/23 243 lb (110.2 kg)  01/13/23 239 lb (108.4 kg)    Ocular: Sclerae unicteric, pupils equal, round and reactive to light Ear-nose-throat: Oropharynx clear, dentition fair Lymphatic: No cervical or supraclavicular adenopathy Lungs no rales or rhonchi, good excursion bilaterally Heart: Bradycardia, irregular rhythm, possible systolic murmur Abd soft, nontender, positive bowel sounds MSK no focal spinal tenderness, no joint edema Neuro: non-focal, well-oriented, appropriate affect  Lab Results  Component Value Date   WBC 4.0 07/14/2023   HGB  12.7 07/14/2023   HCT 37.2 07/14/2023   MCV 95.9 07/14/2023   PLT 149 (L) 07/14/2023   Lab Results  Component Value Date   FERRITIN 25 05/15/2020   IRON 89 05/15/2020   TIBC 344 05/15/2020   UIBC 255 05/15/2020   IRONPCTSAT 26 05/15/2020   Lab Results  Component Value Date   RETICCTPCT 2.5 05/15/2020   RBC 3.88 07/14/2023   Lab Results  Component Value Date   KPAFRELGTCHN 36.7 (H) 04/14/2023   LAMBDASER 3.6 (L) 04/14/2023   KAPLAMBRATIO 10.19 (H) 04/14/2023   Lab Results  Component Value Date   IGGSERUM 433 (L) 04/14/2023   IGA 17 (L) 04/14/2023   IGMSERUM 68 04/14/2023   Lab Results  Component Value Date   TOTALPROTELP 6.3 04/14/2023   ALBUMINELP 4.0 04/14/2023   A1GS 0.2 04/14/2023   A2GS 0.7 04/14/2023   BETS 1.0 04/14/2023   GAMS 0.5 04/14/2023   MSPIKE Not Observed 04/14/2023   SPEI Comment 04/14/2023     Chemistry      Component Value Date/Time   NA 141 04/14/2023 1423   K 4.2 04/14/2023 1423   CL 106 04/14/2023 1423   CO2 25 04/14/2023 1423   BUN 13 04/14/2023 1423   CREATININE 0.74 04/14/2023 1423   CREATININE 0.69 02/01/2020 1149      Component Value Date/Time   CALCIUM 8.9 04/14/2023 1423   ALKPHOS 80 04/14/2023 1423   AST 17 04/14/2023 1423   ALT 12 04/14/2023 1423   BILITOT 0.6 04/14/2023 1423       Impression and Plan: Sara Sanford is a very pleasant 67 yo caucasian female with CLL. In terms of her CLL continues to do well. Her counts remain stable.   Today her labs are relatively stable. Platelet count is 149.  BP/pulse still a bit concerning despite hydration/salt increase. Now with chest pain, dizziness- have resolved but have occurred within 1 hour. EKG shows sinus arrhythmia without ST irregularity-non- concerning for STEMI. I have recommended ER evaluation for labs and cardiac monitoring.   She will continue her same regimen with daily folic acid and Venetoclax.   Disposition: ER today RTC 3 months APP/MD, labs (CBC w/, CMP,  SPEP, save smear, LDH, light chains, MM panel)-Vazquez   Rushie Chestnut, PA-C 8/5/202411:48 AM

## 2023-07-14 NOTE — ED Notes (Signed)
Patient transported to X-ray 

## 2023-07-15 ENCOUNTER — Other Ambulatory Visit: Payer: Self-pay

## 2023-07-15 ENCOUNTER — Other Ambulatory Visit (HOSPITAL_COMMUNITY): Payer: Self-pay

## 2023-07-16 ENCOUNTER — Other Ambulatory Visit (HOSPITAL_COMMUNITY): Payer: Self-pay

## 2023-07-16 ENCOUNTER — Other Ambulatory Visit: Payer: Self-pay

## 2023-07-16 DIAGNOSIS — C9110 Chronic lymphocytic leukemia of B-cell type not having achieved remission: Secondary | ICD-10-CM

## 2023-07-16 MED ORDER — VENETOCLAX 100 MG PO TABS
100.0000 mg | ORAL_TABLET | Freq: Every day | ORAL | 2 refills | Status: DC
  Filled 2023-07-16: qty 28, 28d supply, fill #0
  Filled 2023-08-15: qty 28, 28d supply, fill #1
  Filled 2023-09-17: qty 28, 28d supply, fill #2

## 2023-07-16 NOTE — Telephone Encounter (Signed)
Received refill request from [facility] requesting a new prescription of Venclexta to be sent to the [facility] pharmacy. Need confirmation on if pt is to be taking one tablet daily or 2 tablets daily. Per Dr Myna Hidalgo- pt is to take 1 tablet daily. ERX sent.

## 2023-07-21 ENCOUNTER — Other Ambulatory Visit (HOSPITAL_COMMUNITY): Payer: Self-pay

## 2023-08-15 ENCOUNTER — Other Ambulatory Visit (HOSPITAL_COMMUNITY): Payer: Self-pay

## 2023-09-05 ENCOUNTER — Other Ambulatory Visit: Payer: Self-pay

## 2023-09-10 ENCOUNTER — Other Ambulatory Visit: Payer: Self-pay | Admitting: Family Medicine

## 2023-09-10 DIAGNOSIS — Z1231 Encounter for screening mammogram for malignant neoplasm of breast: Secondary | ICD-10-CM

## 2023-09-15 ENCOUNTER — Other Ambulatory Visit (HOSPITAL_COMMUNITY): Payer: Self-pay

## 2023-09-17 ENCOUNTER — Other Ambulatory Visit (HOSPITAL_COMMUNITY): Payer: Self-pay | Admitting: Pharmacy Technician

## 2023-09-17 ENCOUNTER — Other Ambulatory Visit (HOSPITAL_COMMUNITY): Payer: Self-pay

## 2023-09-17 NOTE — Progress Notes (Signed)
Specialty Pharmacy Refill Coordination Note  Sara Sanford is a 67 y.o. female contacted today regarding refills of specialty medication(s) Venetoclax   Patient requested Delivery  Delivery date: 10/08/23 Verified address: [address] [place]. [phone]   Medication will be filled on 10/07/23.   Called patient to verify delivery date since it said 10/07/24.

## 2023-10-07 ENCOUNTER — Other Ambulatory Visit (HOSPITAL_COMMUNITY): Payer: Self-pay

## 2023-10-13 NOTE — Progress Notes (Signed)
HPI: Follow-up chest pain.  Previously followed by Dr. Tomie China but transitioning to me.  Abdominal ultrasound March 2019 showed mild aneurysmal dilatation of the abdominal aorta at 3.1 cm.  Follow-up ultrasound September 2022 showed no aneurysm.  Echocardiogram November 2022 showed normal LV function, grade 1 diastolic dysfunction, moderate left atrial enlargement, moderate mitral regurgitation.  Patient apparently has had occasional chest pain for years.  She was seen August 2024 in the emergency room with chest pain.  Chest x-ray showed no active cardiopulmonary disease.  Troponin was negative.  Hemoglobin 12.4.  Creatinine 0.71.  Electrocardiogram showed sinus rhythm with PACs.  Follow-up showed normal sinus rhythm with no ST changes.  Since last seen she has dyspnea with more vigorous activities but not routine activities.  She continues to have occasional chest pain that is rare.  It radiates to her left upper extremity and lasts approximately 10 minutes.  She has had this intermittently since she was a child by report and is not related to exertion.  She denies exertional chest pain.  No syncope.  Current Outpatient Medications  Medication Sig Dispense Refill   clobetasol ointment (TEMOVATE) 0.05 % Apply to affected area every night for 4 weeks, then every other day for 4 weeks and then twice a week for 4 weeks or until resolution. May also take prn 30 g 5   fluconazole (DIFLUCAN) 100 MG tablet Take 1 tablet (100 mg total) by mouth daily. Take if you think you are getting a yeast infection. 3 tablet 0   FLUoxetine (PROZAC) 20 MG capsule TAKE ONE CAPSULE BY MOUTH DAILY 90 capsule 1   folic acid (FOLVITE) 1 MG tablet TAKE TWO TABLETS BY MOUTH DAILY 180 tablet 4   omeprazole (PRILOSEC) 20 MG capsule Take 20 mg by mouth every other day.     sulfamethoxazole-trimethoprim (BACTRIM DS) 800-160 MG tablet Take 1 tablet by mouth 2 (two) times daily. 14 tablet 0   venetoclax (VENCLEXTA) 100 MG tablet  Take 1 tablet (100 mg total) by mouth daily. 30 tablet 2   vitamin B-12 (CYANOCOBALAMIN) 1000 MCG tablet Take 1,000 mcg by mouth daily.     nitroGLYCERIN (NITROSTAT) 0.4 MG SL tablet Place 1 tablet (0.4 mg total) under the tongue every 5 (five) minutes as needed. 25 tablet 6   No current facility-administered medications for this visit.     Past Medical History:  Diagnosis Date   Abdominal aortic aneurysm (HCC) 08/01/2014   3.2 cm seen on [place] 07/2014. No change 02/2018. Repeat in 3 yr.    Allergy    Anxiety    Aortic atherosclerosis (HCC) 08/23/2021   Arthritis    Cholelithiasis 08/17/2014   CLL (chronic lymphocytic leukemia) (HCC) 08/12/2019   GAD (generalized anxiety disorder) 02/17/2017   GERD 05/22/2009   Qualifier: Diagnosis of  By: Cathey Endow DO, Karen     Heart burn    peptic ulcer   Hyperlipidemia 01/20/2020   Kidney stones    Left leg pain 09/09/2019   LEG EDEMA, BILATERAL 05/22/2009   Qualifier: Diagnosis of  By: Cathey Endow DO, Karen     Lichen sclerosus    Macrocytic anemia 09/07/2019   OBESITY 05/22/2009   Qualifier: Diagnosis of  By: Thomos Lemons     POLYARTHRITIS 06/19/2010   Qualifier: Diagnosis of  By: Cathey Endow DO, Karen     Post-menopausal    Primary osteoarthritis of left knee 09/09/2019   Rash and nonspecific skin eruption 01/03/2020   Recurrent UTI 06/30/2019  Past Surgical History:  Procedure Laterality Date   CARDIAC CATHETERIZATION  2008   CHOLECYSTECTOMY     COLONOSCOPY  2010   [place], [place]   DILATION AND CURETTAGE OF UTERUS     ESOPHAGOGASTRODUODENOSCOPY  2005   [place],  had egd and colon at the same time    GALLBLADDER SURGERY  2015   HYSTEROSCOPY WITH D & C  2008   KIDNEY STONE SURGERY  1977   UPPER GASTROINTESTINAL ENDOSCOPY      Social History   Socioeconomic History   Marital status: Married    Spouse name: Kashika Kross   Number of children: 2   Years of education: 12th grade   Highest education level: 12th grade  Occupational History    Occupation: Retail buyer: Lawyer   Occupation: Retired  Tobacco Use   Smoking status: Never   Smokeless tobacco: Never  Vaping Use   Vaping status: Never Used  Substance and Sexual Activity   Alcohol use: No   Drug use: Never   Sexual activity: Yes    Partners: Male    Birth control/protection: Post-menopausal  Other Topics Concern   Not on file  Social History Narrative   Lives with her husband. She has two children. She enjoys sewing and quilting.    Social Determinants of Health   Financial Resource Strain: Low Risk  (07/24/2022)   Overall Financial Resource Strain (CARDIA)    Difficulty of Paying Living Expenses: Not hard at all  Food Insecurity: No Food Insecurity (07/24/2022)   Hunger Vital Sign    Worried About Running Out of Food in the Last Year: Never true    Ran Out of Food in the Last Year: Never true  Transportation Needs: No Transportation Needs (07/24/2022)   PRAPARE - Administrator, Civil Service (Medical): No    Lack of Transportation (Non-Medical): No  Physical Activity: Inactive (07/24/2022)   Exercise Vital Sign    Days of Exercise per Week: 0 days    Minutes of Exercise per Session: 0 min  Stress: No Stress Concern Present (07/24/2022)   [employer] of Occupational Health - Occupational Stress Questionnaire    Feeling of Stress : Not at all  Social Connections: Socially Integrated (07/24/2022)   Social Connection and Isolation Panel [NHANES]    Frequency of Communication with Friends and Family: More than three times a week    Frequency of Social Gatherings with Friends and Family: Once a week    Attends Religious Services: More than 4 times per year    Active Member of [employer] or Organizations: Yes    Attends Engineer, structural: More than 4 times per year    Marital Status: Married  Catering manager Violence: Not At Risk (07/24/2022)   Humiliation, Afraid, Rape, and Kick questionnaire    Fear of Current or  Ex-Partner: No    Emotionally Abused: No    Physically Abused: No    Sexually Abused: No    Family History  Problem Relation Age of Onset   Breast cancer Paternal Aunt        pm breast CA   Breast cancer Paternal Aunt        PM breast CA   Heart attack Father    Hypertension Father    Colon polyps Father    Hypertension Mother    Stomach cancer Maternal Grandmother    Colon cancer Neg Hx    Rectal cancer Neg Hx  ROS: no fevers or chills, productive cough, hemoptysis, dysphasia, odynophagia, melena, hematochezia, dysuria, hematuria, rash, seizure activity, orthopnea, PND, pedal edema, claudication. Remaining systems are negative.  Physical Exam: Well-developed well-nourished in no acute distress.  Skin is warm and dry.  HEENT is normal.  Neck is supple.  Chest is clear to auscultation with normal expansion.  Cardiovascular exam is regular rate and rhythm.  Abdominal exam nontender or distended. No masses palpated. Extremities show no edema. neuro grossly intact  ECG-July 14, 2023-sinus rhythm with PACs and no ST changes personally reviewed  A/P  1 chest pain-symptoms are atypical.  They have been intermittent since she was a child and she does not have exertional chest pain.  Will not pursue further evaluation.  2 question history of small abdominal aortic aneurysm-follow-up study in 2022 showed no aneurysm.  3 hyperlipidemia-continue diet.  4 history of moderate mitral regurgitation-we will plan follow-up echocardiogram.  Olga Millers, MD

## 2023-10-15 ENCOUNTER — Encounter: Payer: Self-pay | Admitting: Hematology & Oncology

## 2023-10-15 ENCOUNTER — Inpatient Hospital Stay: Payer: Medicare Other | Attending: Family

## 2023-10-15 ENCOUNTER — Inpatient Hospital Stay (HOSPITAL_BASED_OUTPATIENT_CLINIC_OR_DEPARTMENT_OTHER): Payer: Medicare Other | Admitting: Hematology & Oncology

## 2023-10-15 ENCOUNTER — Other Ambulatory Visit: Payer: Self-pay

## 2023-10-15 VITALS — BP 105/35 | HR 68 | Temp 98.0°F | Resp 20 | Ht 64.0 in | Wt 243.0 lb

## 2023-10-15 DIAGNOSIS — Z79899 Other long term (current) drug therapy: Secondary | ICD-10-CM | POA: Diagnosis not present

## 2023-10-15 DIAGNOSIS — R5383 Other fatigue: Secondary | ICD-10-CM

## 2023-10-15 DIAGNOSIS — Z7969 Long term (current) use of other immunomodulators and immunosuppressants: Secondary | ICD-10-CM | POA: Diagnosis not present

## 2023-10-15 DIAGNOSIS — C9110 Chronic lymphocytic leukemia of B-cell type not having achieved remission: Secondary | ICD-10-CM | POA: Insufficient documentation

## 2023-10-15 DIAGNOSIS — R319 Hematuria, unspecified: Secondary | ICD-10-CM

## 2023-10-15 DIAGNOSIS — R300 Dysuria: Secondary | ICD-10-CM | POA: Diagnosis not present

## 2023-10-15 DIAGNOSIS — R3915 Urgency of urination: Secondary | ICD-10-CM | POA: Diagnosis not present

## 2023-10-15 DIAGNOSIS — Z881 Allergy status to other antibiotic agents status: Secondary | ICD-10-CM | POA: Insufficient documentation

## 2023-10-15 DIAGNOSIS — N390 Urinary tract infection, site not specified: Secondary | ICD-10-CM | POA: Insufficient documentation

## 2023-10-15 DIAGNOSIS — I499 Cardiac arrhythmia, unspecified: Secondary | ICD-10-CM

## 2023-10-15 LAB — CMP (CANCER CENTER ONLY)
ALT: 15 U/L (ref 0–44)
AST: 20 U/L (ref 15–41)
Albumin: 4.5 g/dL (ref 3.5–5.0)
Alkaline Phosphatase: 80 U/L (ref 38–126)
Anion gap: 7 (ref 5–15)
BUN: 13 mg/dL (ref 8–23)
CO2: 29 mmol/L (ref 22–32)
Calcium: 9.3 mg/dL (ref 8.9–10.3)
Chloride: 106 mmol/L (ref 98–111)
Creatinine: 0.78 mg/dL (ref 0.44–1.00)
GFR, Estimated: >60 mL/min (ref >60)
Glucose, Bld: 83 mg/dL (ref 70–99)
Potassium: 4.2 mmol/L (ref 3.5–5.1)
Sodium: 142 mmol/L (ref 135–145)
Total Bilirubin: 0.7 mg/dL (ref <1.2)
Total Protein: 6.6 g/dL (ref 6.5–8.1)

## 2023-10-15 LAB — CBC WITH DIFFERENTIAL (CANCER CENTER ONLY)
Abs Immature Granulocytes: 0.01 10*3/uL (ref 0.00–0.07)
Basophils Absolute: 0 10*3/uL (ref 0.0–0.1)
Basophils Relative: 0 %
Eosinophils Absolute: 0 10*3/uL (ref 0.0–0.5)
Eosinophils Relative: 0 %
HCT: 37.9 % (ref 36.0–46.0)
Hemoglobin: 13.1 g/dL (ref 12.0–15.0)
Immature Granulocytes: 0 %
Lymphocytes Relative: 30 %
Lymphs Abs: 1.2 10*3/uL (ref 0.7–4.0)
MCH: 33.2 pg (ref 26.0–34.0)
MCHC: 34.6 g/dL (ref 30.0–36.0)
MCV: 95.9 fL (ref 80.0–100.0)
Monocytes Absolute: 0.3 10*3/uL (ref 0.1–1.0)
Monocytes Relative: 8 %
Neutro Abs: 2.4 10*3/uL (ref 1.7–7.7)
Neutrophils Relative %: 62 %
Platelet Count: 175 10*3/uL (ref 150–400)
RBC: 3.95 MIL/uL (ref 3.87–5.11)
RDW: 12.3 % (ref 11.5–15.5)
WBC Count: 3.9 10*3/uL — ABNORMAL LOW (ref 4.0–10.5)
nRBC: 0 % (ref 0.0–0.2)

## 2023-10-15 LAB — TSH: TSH: 1.896 u[IU]/mL (ref 0.350–4.500)

## 2023-10-15 LAB — URINALYSIS, COMPLETE (UACMP) WITH MICROSCOPIC
Bilirubin Urine: NEGATIVE
Glucose, UA: NEGATIVE mg/dL
Ketones, ur: NEGATIVE mg/dL
Nitrite: NEGATIVE
Protein, ur: NEGATIVE mg/dL
Specific Gravity, Urine: 1.02 (ref 1.005–1.030)
pH: 6 (ref 5.0–8.0)

## 2023-10-15 LAB — LACTATE DEHYDROGENASE: LDH: 225 U/L — ABNORMAL HIGH (ref 98–192)

## 2023-10-15 LAB — SAVE SMEAR(SSMR), FOR PROVIDER SLIDE REVIEW

## 2023-10-15 MED ORDER — SULFAMETHOXAZOLE-TRIMETHOPRIM 800-160 MG PO TABS: 1.0000 | ORAL_TABLET | Freq: Two times a day (BID) | ORAL | 0 refills | Status: DC

## 2023-10-15 MED ORDER — FLUCONAZOLE 100 MG PO TABS: 100.0000 mg | ORAL_TABLET | Freq: Every day | ORAL | 0 refills | Status: DC

## 2023-10-15 NOTE — Progress Notes (Signed)
Hematology and Oncology Follow Up Visit  Sara Sanford [id number] 11-17-1956 67 y.o. 10/15/2023   Principle Diagnosis:                CLL FISH panel: (+) for WUJ(81X91); neg for del(17p) or                t(11;14)             IgHV unmutated                Current Therapy: Calquence 100 mg PO BID -- d/c on 07/15/2022 Venetoclax 100 mg po q day -- start on -08/2020   Interim History:  Sara Sanford is here today for follow-up.  She is doing okay.  She thinks that she may have a urinary tract infection.  We will check a urinalysis on her.  She was wondering when she might be able to come off the venetoclax.  I told her that we probably do a bone marrow biopsy on her in the springtime of 2025.  This will tell us if we can get her off the venetoclax and just watch her.  She is doing okay otherwise.  She has had no problem with fever.  She has had no infections.  She has had no bleeding.  There has been no nausea or vomiting.  She has had no change in bowel or bladder habits.  She has had no change in leg swelling.  Overall, I would say that her performance status is probably ECOG 1.    Medications:  Allergies as of 10/15/2023       Reactions   Propoxyphene Anaphylaxis, Shortness Of Breath, Other (See Comments)   Hallucinations   Keflex [cephalexin] Diarrhea   Nitrofurantoin Diarrhea, Rash   Welts with blisters        Medication List        Accurate as of October 15, 2023  2:45 PM. If you have any questions, ask your nurse or doctor.          clobetasol ointment 0.05 % Commonly known as: TEMOVATE Apply to affected area every night for 4 weeks, then every other day for 4 weeks and then twice a week for 4 weeks or until resolution. May also take prn   cyanocobalamin 1000 MCG tablet Commonly known as: VITAMIN B12 Take 1,000 mcg by mouth daily.   fluconazole 100 MG tablet Commonly known as: DIFLUCAN Take 1 tablet (100 mg total) by mouth daily. Take if you think you  are getting a yeast infection. Started by: Josph Macho   FLUoxetine 20 MG capsule Commonly known as: PROZAC TAKE ONE CAPSULE BY MOUTH DAILY   folic acid 1 MG tablet Commonly known as: FOLVITE TAKE TWO TABLETS BY MOUTH DAILY   nitroGLYCERIN 0.4 MG SL tablet Commonly known as: NITROSTAT Place 1 tablet (0.4 mg total) under the tongue every 5 (five) minutes as needed.   omeprazole 20 MG capsule Commonly known as: PRILOSEC Take 20 mg by mouth every other day.   sulfamethoxazole-trimethoprim 800-160 MG tablet Commonly known as: BACTRIM DS Take 1 tablet by mouth 2 (two) times daily. Started by: Josph Macho   Venclexta 100 MG tablet Generic drug: venetoclax Take 1 tablet (100 mg total) by mouth daily.        Allergies:  Allergies  Allergen Reactions   Propoxyphene Anaphylaxis, Shortness Of Breath and Other (See Comments)    Hallucinations    Keflex [Cephalexin] Diarrhea   Nitrofurantoin Diarrhea  and Rash    Welts with blisters    Past Medical History, Surgical history, Social history, and Family History were reviewed and updated.  Review of Systems: Review of Systems  Constitutional: Negative.   HENT: Negative.    Eyes: Negative.   Respiratory: Negative.    Cardiovascular: Negative.   Gastrointestinal: Negative.   Genitourinary:  Positive for dysuria, frequency and urgency.  Musculoskeletal: Negative.   Skin: Negative.   Neurological: Negative.   Endo/Heme/Allergies: Negative.   Psychiatric/Behavioral: Negative.       Physical Exam:  height is 5\' 4"  (1.626 m) and weight is 243 lb (110.2 kg). Her oral temperature is 98 F (36.7 C). Her blood pressure is 105/35 (abnormal) and her pulse is 68. Her respiration is 20 and oxygen saturation is 98%.   Wt Readings from Last 3 Encounters:  10/15/23 243 lb (110.2 kg)  07/14/23 240 lb (108.9 kg)  07/14/23 245 lb (111.1 kg)    Physical Exam Vitals reviewed.  HENT:     Head: Normocephalic and atraumatic.   Eyes:     Pupils: Pupils are equal, round, and reactive to light.  Cardiovascular:     Rate and Rhythm: Normal rate and regular rhythm.     Heart sounds: Normal heart sounds.  Pulmonary:     Effort: Pulmonary effort is normal.     Breath sounds: Normal breath sounds.  Abdominal:     General: Bowel sounds are normal.     Palpations: Abdomen is soft.  Musculoskeletal:        General: No tenderness or deformity. Normal range of motion.     Cervical back: Normal range of motion.  Lymphadenopathy:     Cervical: No cervical adenopathy.  Skin:    General: Skin is warm and dry.     Findings: No erythema or rash.  Neurological:     Mental Status: She is alert and oriented to person, place, and time.  Psychiatric:        Behavior: Behavior normal.        Thought Content: Thought content normal.        Judgment: Judgment normal.      Lab Results  Component Value Date   WBC 3.9 (L) 10/15/2023   HGB 13.1 10/15/2023   HCT 37.9 10/15/2023   MCV 95.9 10/15/2023   PLT 175 10/15/2023   Lab Results  Component Value Date   FERRITIN 25 05/15/2020   IRON 89 05/15/2020   TIBC 344 05/15/2020   UIBC 255 05/15/2020   IRONPCTSAT 26 05/15/2020   Lab Results  Component Value Date   RETICCTPCT 2.5 05/15/2020   RBC 3.95 10/15/2023   Lab Results  Component Value Date   KPAFRELGTCHN 33.2 (H) 07/14/2023   LAMBDASER 3.8 (L) 07/14/2023   KAPLAMBRATIO 8.74 (H) 07/14/2023   Lab Results  Component Value Date   IGGSERUM 436 (L) 07/14/2023   IGA 16 (L) 07/14/2023   IGMSERUM 77 07/14/2023   Lab Results  Component Value Date   TOTALPROTELP 5.6 (L) 07/14/2023   ALBUMINELP 4.0 04/14/2023   A1GS 0.2 04/14/2023   A2GS 0.7 04/14/2023   BETS 1.0 04/14/2023   GAMS 0.5 04/14/2023   MSPIKE Not Observed 04/14/2023   SPEI Comment 04/14/2023     Chemistry      Component Value Date/Time   NA 142 10/15/2023 1333   K 4.2 10/15/2023 1333   CL 106 10/15/2023 1333   CO2 29 10/15/2023 1333   BUN  13 10/15/2023 1333  CREATININE 0.78 10/15/2023 1333   CREATININE 0.69 02/01/2020 1149      Component Value Date/Time   CALCIUM 9.3 10/15/2023 1333   ALKPHOS 80 10/15/2023 1333   AST 20 10/15/2023 1333   ALT 15 10/15/2023 1333   BILITOT 0.7 10/15/2023 1333       Impression and Plan: Sara Sanford is a very pleasant 66 yo caucasian female with CLL. In terms of her CLL continues to do well. Her counts remain stable.   Again, I think we will go ahead and do a bone marrow biopsy on her.  Will set her up for April 2025.  If all looks good, then we will get off the venetoclax and just follow her along.  We will also send in Bactrim DS for her.  Looks like she does have a urine infection on her urinalysis.  Hopefully, moderate bacteria is cultured will be sensitive to the Bactrim DS.  I would like to get her back in 6 months.  Hopefully, again, at that point, we will be able to get her off the venetoclax.     Josph Macho, MD 11/6/20242:45 PM

## 2023-10-16 LAB — KAPPA/LAMBDA LIGHT CHAINS
Kappa free light chain: 38.9 mg/L — ABNORMAL HIGH (ref 3.3–19.4)
Kappa, lambda light chain ratio: 7.07 — ABNORMAL HIGH (ref 0.26–1.65)
Lambda free light chains: 5.5 mg/L — ABNORMAL LOW (ref 5.7–26.3)

## 2023-10-16 LAB — URINE CULTURE

## 2023-10-17 ENCOUNTER — Encounter: Payer: Self-pay | Admitting: *Deleted

## 2023-10-20 LAB — MULTIPLE MYELOMA PANEL, SERUM
Albumin SerPl Elph-Mcnc: 3.6 g/dL (ref 2.9–4.4)
Albumin/Glob SerPl: 1.6 (ref 0.7–1.7)
Alpha 1: 0.2 g/dL (ref 0.0–0.4)
Alpha2 Glob SerPl Elph-Mcnc: 0.7 g/dL (ref 0.4–1.0)
B-Globulin SerPl Elph-Mcnc: 0.9 g/dL (ref 0.7–1.3)
Gamma Glob SerPl Elph-Mcnc: 0.6 g/dL (ref 0.4–1.8)
Globulin, Total: 2.4 g/dL (ref 2.2–3.9)
IgA: 18 mg/dL — ABNORMAL LOW (ref 87–352)
IgG (Immunoglobin G), Serum: 533 mg/dL — ABNORMAL LOW (ref 586–1602)
IgM (Immunoglobulin M), Srm: 94 mg/dL (ref 26–217)
Total Protein ELP: 6 g/dL (ref 6.0–8.5)

## 2023-10-22 ENCOUNTER — Ambulatory Visit: Payer: Medicare Other

## 2023-10-22 DIAGNOSIS — Z1231 Encounter for screening mammogram for malignant neoplasm of breast: Secondary | ICD-10-CM

## 2023-10-23 ENCOUNTER — Other Ambulatory Visit: Payer: Self-pay

## 2023-10-24 ENCOUNTER — Other Ambulatory Visit: Payer: Self-pay

## 2023-10-24 NOTE — Progress Notes (Signed)
Please call patient. Normal mammogram.  Repeat in 1 year.  

## 2023-10-27 ENCOUNTER — Encounter: Payer: Self-pay | Admitting: Cardiology

## 2023-10-27 ENCOUNTER — Ambulatory Visit (INDEPENDENT_AMBULATORY_CARE_PROVIDER_SITE_OTHER): Payer: Medicare Other | Admitting: Cardiology

## 2023-10-27 VITALS — BP 130/78 | HR 56 | Ht 64.0 in | Wt 246.0 lb

## 2023-10-27 DIAGNOSIS — R079 Chest pain, unspecified: Secondary | ICD-10-CM

## 2023-10-27 DIAGNOSIS — I059 Rheumatic mitral valve disease, unspecified: Secondary | ICD-10-CM

## 2023-10-27 NOTE — Patient Instructions (Signed)
    Testing/Procedures:  Your physician has requested that you have an echocardiogram. Echocardiography is a painless test that uses sound waves to create images of your heart. It provides your doctor with information about the size and shape of your heart and how well your heart's chambers and valves are working. This procedure takes approximately one hour. There are no restrictions for this procedure. Please do NOT wear cologne, perfume, aftershave, or lotions (deodorant is allowed). Please arrive 15 minutes prior to your appointment time. [facility] FLOOR IMAGING DEPARTMENT   Follow-Up: At [facility], you and your health needs are our priority.  As part of our continuing mission to provide you with exceptional heart care, we have created designated Provider Care Teams.  These Care Teams include your primary Cardiologist (physician) and Advanced Practice Providers (APPs -  Physician Assistants and Nurse Practitioners) who all work together to provide you with the care you need, when you need it.  We recommend signing up for the patient portal called "MyChart".  Sign up information is provided on this After Visit Summary.  MyChart is used to connect with patients for Virtual Visits (Telemedicine).  Patients are able to view lab/test results, encounter notes, upcoming appointments, etc.  Non-urgent messages can be sent to your provider as well.   To learn more about what you can do with MyChart, go to [email or web address].    Your next appointment:   12 month(s)  Provider:   Olga Millers, MD

## 2023-10-28 ENCOUNTER — Other Ambulatory Visit (HOSPITAL_COMMUNITY): Payer: Self-pay

## 2023-10-28 ENCOUNTER — Other Ambulatory Visit: Payer: Self-pay | Admitting: Hematology & Oncology

## 2023-10-28 ENCOUNTER — Other Ambulatory Visit: Payer: Self-pay

## 2023-10-28 DIAGNOSIS — C9110 Chronic lymphocytic leukemia of B-cell type not having achieved remission: Secondary | ICD-10-CM

## 2023-10-28 MED ORDER — VENETOCLAX 100 MG PO TABS
100.0000 mg | ORAL_TABLET | Freq: Every day | ORAL | 2 refills | Status: DC
Start: 2023-10-28 — End: 2024-01-27
  Filled 2023-10-28: qty 28, 28d supply, fill #0
  Filled 2023-12-02: qty 28, 28d supply, fill #1
  Filled 2023-12-30: qty 28, 28d supply, fill #2

## 2023-10-28 NOTE — Progress Notes (Signed)
Specialty Pharmacy Ongoing Clinical Assessment Note  Sara Sanford is a 67 y.o. female who is being followed by the specialty pharmacy service for RxSp Oncology   Patient's specialty medication(s) reviewed today: Venetoclax   Missed doses in the last 4 weeks: 0   Patient/Caregiver did not have any additional questions or concerns.   Therapeutic benefit summary: Patient is achieving benefit   Adverse events/side effects summary: No adverse events/side effects   Patient's therapy is appropriate to: Continue    Goals Addressed             This Visit's Progress    Achieve or maintain remission       Patient is on track. Patient will maintain adherence.  She remains stable at this time.          Follow up:  6 months  Servando Snare Specialty Pharmacist

## 2023-10-28 NOTE — Progress Notes (Signed)
Specialty Pharmacy Refill Coordination Note  Sara Sanford is a 67 y.o. female contacted today regarding refills of specialty medication(s) Venetoclax   Patient requested Delivery   Delivery date: 11/12/23   Verified address: [address] [place] [address]   Medication will be filled on 11/11/23. *Pending refill request*

## 2023-11-11 ENCOUNTER — Other Ambulatory Visit: Payer: Self-pay

## 2023-11-17 ENCOUNTER — Ambulatory Visit (HOSPITAL_BASED_OUTPATIENT_CLINIC_OR_DEPARTMENT_OTHER)
Admission: RE | Admit: 2023-11-17 | Discharge: 2023-11-17 | Disposition: A | Discharge status: Home / Self Care | Payer: Medicare Other | Source: Ambulatory Visit | Attending: Cardiology | Admitting: Cardiology

## 2023-11-17 DIAGNOSIS — I059 Rheumatic mitral valve disease, unspecified: Secondary | ICD-10-CM | POA: Insufficient documentation

## 2023-11-17 DIAGNOSIS — I340 Nonrheumatic mitral (valve) insufficiency: Secondary | ICD-10-CM | POA: Diagnosis not present

## 2023-11-17 LAB — ECHOCARDIOGRAM COMPLETE
AR max vel: 2.56 cm2
AV Area VTI: 2.43 cm2
AV Area mean vel: 2.3 cm2
AV Mean grad: 6 mm[Hg]
AV Peak grad: 10.3 mm[Hg]
Ao pk vel: 1.61 m/s
Area-P 1/2: 2.96 cm2
Calc EF: 63.6 %
MV M vel: 4.63 m/s
MV Peak grad: 85.6 mm[Hg]
S' Lateral: 3.5 cm
Single Plane A2C EF: 68.2 %
Single Plane A4C EF: 56.7 %

## 2023-11-26 ENCOUNTER — Other Ambulatory Visit: Payer: Self-pay | Admitting: Family Medicine

## 2023-11-28 ENCOUNTER — Other Ambulatory Visit: Payer: Self-pay

## 2023-12-01 ENCOUNTER — Other Ambulatory Visit: Payer: Self-pay

## 2023-12-02 ENCOUNTER — Other Ambulatory Visit: Payer: Self-pay

## 2023-12-02 NOTE — Progress Notes (Signed)
Specialty Pharmacy Refill Coordination Note  Sara Sanford is a 67 y.o. female contacted today regarding refills of specialty medication(s) Venetoclax [facility])   Patient requested (Patient-Rptd) Delivery   Delivery date: (Patient-Rptd) 12/08/23   Verified address: (Patient-Rptd) [address]  [place]. [phone]   Medication will be filled on 12.27.24.

## 2023-12-05 ENCOUNTER — Other Ambulatory Visit: Payer: Self-pay

## 2023-12-14 ENCOUNTER — Other Ambulatory Visit (HOSPITAL_COMMUNITY): Payer: Self-pay

## 2023-12-14 ENCOUNTER — Telehealth: Payer: Self-pay

## 2023-12-14 NOTE — Telephone Encounter (Signed)
 Oral Oncology Patient Advocate Encounter  Was successful in securing patient a $8,000.00 grant from [facility] to provide copayment coverage for Venclexta .  This will keep the out of pocket expense at $0.     [employer] ID: [id number]   The billing information is as follows and has been shared with [facility].    RxBin: [id number] PCN: [id number] Member ID: [id number] Group ID: [id number] Dates of Eligibility: 10/04/23 through 10/02/24  Fund:  Chronic Lymphocytic Leukemia   Morene Potters, CPhT Oncology Pharmacy Patient Advocate  [facility]  [phone] (phone) [phone] (fax)

## 2023-12-30 ENCOUNTER — Other Ambulatory Visit (HOSPITAL_COMMUNITY): Payer: Self-pay

## 2023-12-30 ENCOUNTER — Other Ambulatory Visit (HOSPITAL_COMMUNITY): Payer: Self-pay | Admitting: Pharmacy Technician

## 2023-12-30 NOTE — Progress Notes (Signed)
Specialty Pharmacy Refill Coordination Note  DORAN DEC is a 68 y.o. female contacted today regarding refills of specialty medication(s) Venetoclax [facility])   Patient requested Delivery   Delivery date: 01/07/24   Verified address: Patient address [address]  [place]   Medication will be filled on 01/06/24.

## 2024-01-06 ENCOUNTER — Other Ambulatory Visit: Payer: Self-pay

## 2024-01-27 ENCOUNTER — Other Ambulatory Visit: Payer: Self-pay | Admitting: Hematology & Oncology

## 2024-01-27 ENCOUNTER — Other Ambulatory Visit (HOSPITAL_COMMUNITY): Payer: Self-pay

## 2024-01-27 ENCOUNTER — Other Ambulatory Visit: Payer: Self-pay

## 2024-01-27 DIAGNOSIS — C9110 Chronic lymphocytic leukemia of B-cell type not having achieved remission: Secondary | ICD-10-CM

## 2024-01-27 MED ORDER — VENETOCLAX 100 MG PO TABS
100.0000 mg | ORAL_TABLET | Freq: Every day | ORAL | 2 refills | Status: DC
Start: 2024-01-27 — End: 2024-04-15
  Filled 2024-01-27: qty 28, 28d supply, fill #0
  Filled 2024-02-27: qty 28, 28d supply, fill #1
  Filled 2024-04-08: qty 28, 28d supply, fill #2

## 2024-01-27 NOTE — Progress Notes (Signed)
 Specialty Pharmacy Refill Coordination Note  Sara Sanford is a 67 y.o. female contacted today regarding refills of specialty medication(s) Venetoclax [facility])   Patient requested Delivery   Delivery date: 02/02/24   Verified address: Patient address [address]  [place]   Medication will be filled on 02.21.25.   This fill date is pending response to refill request from provider. Patient is aware and if they have not received fill by intended date they must follow up with pharmacy.

## 2024-01-30 ENCOUNTER — Other Ambulatory Visit: Payer: Self-pay

## 2024-02-27 ENCOUNTER — Other Ambulatory Visit: Payer: Self-pay

## 2024-02-27 NOTE — Progress Notes (Signed)
 Specialty Pharmacy Refill Coordination Note  Sara Sanford is a 68 y.o. female contacted today regarding refills of specialty medication(s) Venetoclax [facility])   Patient requested (Patient-Rptd) Delivery   Delivery date: (Patient-Rptd) 03/18/24   Verified address: (Patient-Rptd) [address]  [place], [place]. [address]   Medication will be filled on 03/17/24.

## 2024-03-10 ENCOUNTER — Other Ambulatory Visit: Payer: Self-pay | Admitting: Radiology

## 2024-03-10 DIAGNOSIS — C9110 Chronic lymphocytic leukemia of B-cell type not having achieved remission: Secondary | ICD-10-CM

## 2024-03-10 NOTE — H&P (Signed)
 Chief Complaint: History of CLL - IR consulted for CT bone marrow biopsy  Referring Provider(s): Dr. Arlan Organ  Supervising Physician: Roanna Banning  Patient Status: [facility] - Out-pt  History of Present Illness: Sara Sanford is a 68 y.o. female with hx of CLL, currently on ventoclax treatment, presenting to IR service for CT guided bone marrow biopsy to assess for CLL remission. Pt was first diagnosed with CLL back in 2020. Has since been established with Dr. Myna Hidalgo with oncology. Per oncology notes, patient has been doing well with stable counts. Here for repeat bone marrow biopsy to assess if pt can come off of chemo treatment and just continue to watch and follow regularly with oncology.  *** Patient is Full Code  Past Medical History:  Diagnosis Date   Abdominal aortic aneurysm (HCC) 08/01/2014   3.2 cm seen on [place] 07/2014. No change 02/2018. Repeat in 3 yr.    Allergy    Anxiety    Aortic atherosclerosis (HCC) 08/23/2021   Arthritis    Cholelithiasis 08/17/2014   CLL (chronic lymphocytic leukemia) (HCC) 08/12/2019   GAD (generalized anxiety disorder) 02/17/2017   GERD 05/22/2009   Qualifier: Diagnosis of  By: Cathey Endow DO, Karen     Heart burn    peptic ulcer   Hyperlipidemia 01/20/2020   Kidney stones    Left leg pain 09/09/2019   LEG EDEMA, BILATERAL 05/22/2009   Qualifier: Diagnosis of  By: Cathey Endow DO, Karen     Lichen sclerosus    Macrocytic anemia 09/07/2019   OBESITY 05/22/2009   Qualifier: Diagnosis of  By: Thomos Lemons     POLYARTHRITIS 06/19/2010   Qualifier: Diagnosis of  By: Cathey Endow DO, Karen     Post-menopausal    Primary osteoarthritis of left knee 09/09/2019   Rash and nonspecific skin eruption 01/03/2020   Recurrent UTI 06/30/2019    Past Surgical History:  Procedure Laterality Date   CARDIAC CATHETERIZATION  2008   CHOLECYSTECTOMY     COLONOSCOPY  2010   [facility] [place]   DILATION AND CURETTAGE OF UTERUS     ESOPHAGOGASTRODUODENOSCOPY  2005    [place], Section had egd and colon at the same time    GALLBLADDER SURGERY  2015   HYSTEROSCOPY WITH D & C  2008   KIDNEY STONE SURGERY  1977   UPPER GASTROINTESTINAL ENDOSCOPY      Allergies: Propoxyphene, Keflex [cephalexin], and Nitrofurantoin  Medications: Prior to Admission medications   Medication Sig Start Date End Date Taking? Authorizing Provider  clobetasol ointment (TEMOVATE) 0.05 % Apply to affected area every night for 4 weeks, then every other day for 4 weeks and then twice a week for 4 weeks or until resolution. May also take prn 10/10/22   Milas Hock, MD  fluconazole (DIFLUCAN) 100 MG tablet Take 1 tablet (100 mg total) by mouth daily. Take if you think you are getting a yeast infection. 10/15/23   Josph Macho, MD  FLUoxetine (PROZAC) 20 MG capsule TAKE 1 CAPSULE BY MOUTH DAILY 11/28/23   Agapito Games, MD  folic acid (FOLVITE) 1 MG tablet TAKE TWO TABLETS BY MOUTH DAILY 05/29/23   Josph Macho, MD  nitroGLYCERIN (NITROSTAT) 0.4 MG SL tablet Place 1 tablet (0.4 mg total) under the tongue every 5 (five) minutes as needed. 10/11/21 07/24/22  Revankar, Aundra Dubin, MD  omeprazole (PRILOSEC) 20 MG capsule Take 20 mg by mouth every other day.    [provider]  sulfamethoxazole-trimethoprim (BACTRIM  DS) 800-160 MG tablet Take 1 tablet by mouth 2 (two) times daily. 10/15/23   Josph Macho, MD  venetoclax (VENCLEXTA) 100 MG tablet Take 1 tablet (100 mg total) by mouth daily. 01/27/24 03/26/24  Josph Macho, MD  vitamin B-12 (CYANOCOBALAMIN) 1000 MCG tablet Take 1,000 mcg by mouth daily.    [provider]     Family History  Problem Relation Age of Onset   Breast cancer Paternal Aunt        pm breast CA   Breast cancer Paternal Aunt        PM breast CA   Heart attack Father    Hypertension Father    Colon polyps Father    Hypertension Mother    Stomach cancer Maternal Grandmother    Colon cancer Neg Hx    Rectal cancer Neg Hx      Social History   Socioeconomic History   Marital status: Married    Spouse name: Abigale Dorow   Number of children: 2   Years of education: 12th grade   Highest education level: 12th grade  Occupational History   Occupation: Retail buyer: Lawyer   Occupation: Retired  Tobacco Use   Smoking status: Never   Smokeless tobacco: Never  Vaping Use   Vaping status: Never Used  Substance and Sexual Activity   Alcohol use: No   Drug use: Never   Sexual activity: Yes    Partners: Male    Birth control/protection: Post-menopausal  Other Topics Concern   Not on file  Social History Narrative   Lives with her husband. She has two children. She enjoys sewing and quilting.    Social Drivers of Corporate investment banker Strain: Low Risk  (07/24/2022)   Overall Financial Resource Strain (CARDIA)    Difficulty of Paying Living Expenses: Not hard at all  Food Insecurity: No Food Insecurity (07/24/2022)   Hunger Vital Sign    Worried About Running Out of Food in the Last Year: Never true    Ran Out of Food in the Last Year: Never true  Transportation Needs: No Transportation Needs (07/24/2022)   PRAPARE - Administrator, Civil Service (Medical): No    Lack of Transportation (Non-Medical): No  Physical Activity: Inactive (07/24/2022)   Exercise Vital Sign    Days of Exercise per Week: 0 days    Minutes of Exercise per Session: 0 min  Stress: No Stress Concern Present (07/24/2022)   [employer] of Occupational Health - Occupational Stress Questionnaire    Feeling of Stress : Not at all  Social Connections: Socially Integrated (07/24/2022)   Social Connection and Isolation Panel [NHANES]    Frequency of Communication with Friends and Family: More than three times a week    Frequency of Social Gatherings with Friends and Family: Once a week    Attends Religious Services: More than 4 times per year    Active Member of [employer] or Organizations: Yes     Attends Engineer, structural: More than 4 times per year    Marital Status: Married     Review of Systems: A 12 point ROS discussed and pertinent positives are indicated in the HPI above.  All other systems are negative.  Review of Systems  Vital Signs: There were no vitals taken for this visit.  Advance Care Plan: No documents on file  Physical Exam  Imaging: No results found.  Labs:  CBC: Recent Labs  04/14/23 1423 07/14/23 1123 07/14/23 1415 10/15/23 1333  WBC 4.2 4.0 4.2 3.9*  HGB 12.9 12.7 12.4 13.1  HCT 37.1 37.2 35.2* 37.9  PLT 160 149* 144* 175    COAGS: No results for input(s): "INR", "APTT" in the last 8760 hours.  BMP: Recent Labs    04/14/23 1423 07/14/23 1123 07/14/23 1415 10/15/23 1333  NA 141 141 138 142  K 4.2 4.3 4.4 4.2  CL 106 106 105 106  CO2 25 29 23 29   GLUCOSE 95 92 97 83  BUN 13 12 12 13   CALCIUM 8.9 8.6* 8.6* 9.3  CREATININE 0.74 0.78 0.71 0.78  GFRNONAA >60 >60 >60 >60    LIVER FUNCTION TESTS: Recent Labs    04/14/23 1423 07/14/23 1123 10/15/23 1333  BILITOT 0.6 0.6 0.7  AST 17 18 20   ALT 12 14 15   ALKPHOS 80 76 80  PROT 6.3* 6.2* 6.6  ALBUMIN 4.3 4.1 4.5    TUMOR MARKERS: No results for input(s): "AFPTM", "CEA", "CA199", "CHROMGRNA" in the last 8760 hours.  Assessment and Plan:  Sara Sanford is a 68 y.o. female with hx of CLL, currently on ventoclax treatment, presenting to IR service for CT guided bone marrow biopsy to assess for CLL remission. Pt was first diagnosed with CLL back in 2020. Has since been established with Dr. Myna Hidalgo with oncology. Per oncology notes, patient has been doing well with stable counts. Here for repeat bone marrow biopsy to assess if pt can come off of chemo treatment and just continue to watch and follow regularly with oncology.  Risks and benefits of CT guided bone marrow biopsy was discussed with the patient and/or patient's family including, but not limited to  bleeding, infection, damage to adjacent structures or low yield requiring additional tests.  All of the questions were answered and there is agreement to proceed.  Consent signed and in chart.   Thank you for allowing our service to participate in Sara Sanford 's care.  Electronically Signed: Katheren Puller, PA-C   03/10/2024, 1:53 PM      I spent a total of    25 Minutes in face to face in clinical consultation, greater than 50% of which was counseling/coordinating care for CT guided bone marrow biopsy.

## 2024-03-11 ENCOUNTER — Ambulatory Visit (HOSPITAL_COMMUNITY)
Admission: RE | Admit: 2024-03-11 | Discharge: 2024-03-11 | Disposition: A | Discharge status: Home / Self Care | Source: Ambulatory Visit | Attending: Hematology & Oncology | Admitting: Hematology & Oncology

## 2024-03-11 ENCOUNTER — Ambulatory Visit (HOSPITAL_COMMUNITY)
Admission: RE | Admit: 2024-03-11 | Discharge: 2024-03-11 | Disposition: A | Discharge status: Home / Self Care | Source: Ambulatory Visit | Attending: Hematology & Oncology

## 2024-03-11 ENCOUNTER — Encounter (HOSPITAL_COMMUNITY): Payer: Self-pay

## 2024-03-11 ENCOUNTER — Other Ambulatory Visit: Payer: Self-pay

## 2024-03-11 DIAGNOSIS — Z01818 Encounter for other preprocedural examination: Secondary | ICD-10-CM | POA: Insufficient documentation

## 2024-03-11 DIAGNOSIS — C9110 Chronic lymphocytic leukemia of B-cell type not having achieved remission: Secondary | ICD-10-CM | POA: Insufficient documentation

## 2024-03-11 LAB — CBC WITH DIFFERENTIAL/PLATELET
Abs Immature Granulocytes: 0.01 10*3/uL (ref 0.00–0.07)
Basophils Absolute: 0 10*3/uL (ref 0.0–0.1)
Basophils Relative: 0 %
Eosinophils Absolute: 0 10*3/uL (ref 0.0–0.5)
Eosinophils Relative: 0 %
HCT: 36.9 % (ref 36.0–46.0)
Hemoglobin: 12.7 g/dL (ref 12.0–15.0)
Immature Granulocytes: 0 %
Lymphocytes Relative: 34 %
Lymphs Abs: 1.2 10*3/uL (ref 0.7–4.0)
MCH: 32.6 pg (ref 26.0–34.0)
MCHC: 34.4 g/dL (ref 30.0–36.0)
MCV: 94.9 fL (ref 80.0–100.0)
Monocytes Absolute: 0.4 10*3/uL (ref 0.1–1.0)
Monocytes Relative: 10 %
Neutro Abs: 2 10*3/uL (ref 1.7–7.7)
Neutrophils Relative %: 56 %
Platelets: 148 10*3/uL — ABNORMAL LOW (ref 150–400)
RBC: 3.89 MIL/uL (ref 3.87–5.11)
RDW: 12.5 % (ref 11.5–15.5)
WBC: 3.6 10*3/uL — ABNORMAL LOW (ref 4.0–10.5)
nRBC: 0 % (ref 0.0–0.2)

## 2024-03-11 MED ORDER — SODIUM CHLORIDE 0.9 % IV SOLN: INTRAVENOUS | Status: DC

## 2024-03-11 MED ORDER — MIDAZOLAM HCL 2 MG/2ML IJ SOLN
INTRAMUSCULAR | Status: AC
  Filled 2024-03-11: qty 4

## 2024-03-11 MED ORDER — FENTANYL CITRATE (PF) 100 MCG/2ML IJ SOLN
INTRAMUSCULAR | Status: AC | PRN
  Administered 2024-03-11 (×2): 50 ug via INTRAVENOUS

## 2024-03-11 MED ORDER — MIDAZOLAM HCL 2 MG/2ML IJ SOLN
INTRAMUSCULAR | Status: AC | PRN
  Administered 2024-03-11 (×2): 1 mg via INTRAVENOUS

## 2024-03-11 MED ORDER — FENTANYL CITRATE (PF) 100 MCG/2ML IJ SOLN
INTRAMUSCULAR | Status: AC
  Filled 2024-03-11: qty 2

## 2024-03-11 NOTE — Procedures (Signed)
 Vascular and Interventional Radiology Procedure Note  Patient: Sara Sanford DOB: 1956/10/25 Medical Record Number: [id number] Note Date/Time: 03/11/24 12:10 PM   Performing Physician: Roanna Banning, MD Assistant(s): None  Diagnosis: CLL. Re-evaluation   Procedure: BONE MARROW ASPIRATION and BIOPSY  Anesthesia: Conscious Sedation Complications: None Estimated Blood Loss: Minimal Specimens: Sent for Pathology  Findings:  Successful CT-guided bone marrow aspiration and biopsy A total of 1 cores were obtained. Hemostasis of the tract was achieved using Manual Pressure.  Plan: Bed rest for 1 hours.  See detailed procedure note with images in PACS. The patient tolerated the procedure well without incident or complication and was returned to Recovery in stable condition.    Roanna Banning, MD Vascular and Interventional Radiology Specialists [facility] Radiology   Pager. [phone] Clinic. [phone]

## 2024-03-11 NOTE — Discharge Instructions (Signed)
 Please call Interventional Radiology clinic [phone] with any questions or concerns.  You may remove your dressing and shower tomorrow.   Bone Marrow Aspiration and Bone Marrow Biopsy, Adult, Care After This sheet gives you information about how to care for yourself after your procedure. Your health care provider may also give you more specific instructions. If you have problems or questions, contact your health care provider. What can I expect after the procedure? After the procedure, it is common to have: Mild pain and tenderness. Swelling. Bruising. Follow these instructions at home: Puncture site care Follow instructions from your health care provider about how to take care of the puncture site. Make sure you: Wash your hands with soap and water before and after you change your bandage (dressing). If soap and water are not available, use hand sanitizer. Change your dressing as told by your health care provider. Check your puncture site every day for signs of infection. Check for: More redness, swelling, or pain. Fluid or blood. Warmth. Pus or a bad smell.   Activity Return to your normal activities as told by your health care provider. Ask your health care provider what activities are safe for you. Do not lift anything that is heavier than 10 lb (4.5 kg), or the limit that you are told, until your health care provider says that it is safe. Do not drive for 24 hours if you were given a sedative during your procedure. General instructions Take over-the-counter and prescription medicines only as told by your health care provider. Do not take baths, swim, or use a hot tub until your health care provider approves. Ask your health care provider if you may take showers. You may only be allowed to take sponge baths. If directed, put ice on the affected area. To do this: Put ice in a plastic bag. Place a towel between your skin and the bag. Leave the ice on for 20 minutes, 2-3 times a  day. Keep all follow-up visits as told by your health care provider. This is important.   Contact a health care provider if: Your pain is not controlled with medicine. You have a fever. You have more redness, swelling, or pain around the puncture site. You have fluid or blood coming from the puncture site. Your puncture site feels warm to the touch. You have pus or a bad smell coming from the puncture site. Summary After the procedure, it is common to have mild pain, tenderness, swelling, and bruising. Follow instructions from your health care provider about how to take care of the puncture site and what activities are safe for you. Take over-the-counter and prescription medicines only as told by your health care provider. Contact a health care provider if you have any signs of infection, such as fluid or blood coming from the puncture site. This information is not intended to replace advice given to you by your health care provider. Make sure you discuss any questions you have with your health care provider. Document Revised: 04/13/2019 Document Reviewed: 04/13/2019 Elsevier Patient Education  2021 [employer] Inc[employer]   Moderate Conscious Sedation, Adult, Care After This sheet gives you information about how to care for yourself after your procedure. Your health care provider may also give you more specific instructions. If you have problems or questions, contact your health care provider. What can I expect after the procedure? After the procedure, it is common to have: Sleepiness for several hours. Impaired judgment for several hours. Difficulty with balance. Vomiting if you eat too  soon. Follow these instructions at home: For the time period you were told by your health care provider: Rest. Do not participate in activities where you could fall or become injured. Do not drive or use machinery. Do not drink alcohol. Do not take sleeping pills or medicines that cause drowsiness. Do not  make important decisions or sign legal documents. Do not take care of children on your own.      Eating and drinking Follow the diet recommended by your health care provider. Drink enough fluid to keep your urine pale yellow. If you vomit: Drink water, juice, or soup when you can drink without vomiting. Make sure you have little or no nausea before eating solid foods.   General instructions Take over-the-counter and prescription medicines only as told by your health care provider. Have a responsible adult stay with you for the time you are told. It is important to have someone help care for you until you are awake and alert. Do not smoke. Keep all follow-up visits as told by your health care provider. This is important. Contact a health care provider if: You are still sleepy or having trouble with balance after 24 hours. You feel light-headed. You keep feeling nauseous or you keep vomiting. You develop a rash. You have a fever. You have redness or swelling around the IV site. Get help right away if: You have trouble breathing. You have new-onset confusion at home. Summary After the procedure, it is common to feel sleepy, have impaired judgment, or feel nauseous if you eat too soon. Rest after you get home. Know the things you should not do after the procedure. Follow the diet recommended by your health care provider and drink enough fluid to keep your urine pale yellow. Get help right away if you have trouble breathing or new-onset confusion at home. This information is not intended to replace advice given to you by your health care provider. Make sure you discuss any questions you have with your health care provider. Document Revised: 03/24/2020 Document Reviewed: 10/21/2019 Elsevier Patient Education  2021 [employer].

## 2024-03-15 LAB — SURGICAL PATHOLOGY

## 2024-03-22 ENCOUNTER — Encounter (HOSPITAL_COMMUNITY): Payer: Self-pay | Admitting: Hematology & Oncology

## 2024-04-01 NOTE — Progress Notes (Signed)
 Evaluate CLL for the response to her therapy.  pete

## 2024-04-05 ENCOUNTER — Other Ambulatory Visit: Payer: Self-pay

## 2024-04-08 ENCOUNTER — Other Ambulatory Visit: Payer: Self-pay

## 2024-04-08 NOTE — Progress Notes (Signed)
 Specialty Pharmacy Refill Coordination Note  Sara Sanford is a 68 y.o. female contacted today regarding refills of specialty medication(s) Venetoclax  (VENCLEXTA )   Patient requested Delivery   Delivery date: 04/13/24   Verified address: [address]  [place], [place]. [address]   Medication will be filled on 05.05.25.

## 2024-04-15 ENCOUNTER — Encounter: Payer: Self-pay | Admitting: Hematology & Oncology

## 2024-04-15 ENCOUNTER — Inpatient Hospital Stay (HOSPITAL_BASED_OUTPATIENT_CLINIC_OR_DEPARTMENT_OTHER): Admitting: Hematology & Oncology

## 2024-04-15 ENCOUNTER — Other Ambulatory Visit: Payer: Self-pay

## 2024-04-15 ENCOUNTER — Inpatient Hospital Stay: Attending: Hematology & Oncology

## 2024-04-15 ENCOUNTER — Other Ambulatory Visit (HOSPITAL_COMMUNITY): Payer: Self-pay

## 2024-04-15 DIAGNOSIS — Z881 Allergy status to other antibiotic agents status: Secondary | ICD-10-CM | POA: Diagnosis not present

## 2024-04-15 DIAGNOSIS — C9110 Chronic lymphocytic leukemia of B-cell type not having achieved remission: Secondary | ICD-10-CM | POA: Insufficient documentation

## 2024-04-15 DIAGNOSIS — R300 Dysuria: Secondary | ICD-10-CM | POA: Insufficient documentation

## 2024-04-15 DIAGNOSIS — Z79899 Other long term (current) drug therapy: Secondary | ICD-10-CM | POA: Insufficient documentation

## 2024-04-15 DIAGNOSIS — M7989 Other specified soft tissue disorders: Secondary | ICD-10-CM | POA: Diagnosis not present

## 2024-04-15 DIAGNOSIS — Z7969 Long term (current) use of other immunomodulators and immunosuppressants: Secondary | ICD-10-CM | POA: Diagnosis not present

## 2024-04-15 DIAGNOSIS — H9190 Unspecified hearing loss, unspecified ear: Secondary | ICD-10-CM | POA: Diagnosis not present

## 2024-04-15 DIAGNOSIS — R3915 Urgency of urination: Secondary | ICD-10-CM | POA: Diagnosis not present

## 2024-04-15 LAB — CBC WITH DIFFERENTIAL (CANCER CENTER ONLY)
Abs Immature Granulocytes: 0.01 10*3/uL (ref 0.00–0.07)
Basophils Absolute: 0 10*3/uL (ref 0.0–0.1)
Basophils Relative: 0 %
Eosinophils Absolute: 0 10*3/uL (ref 0.0–0.5)
Eosinophils Relative: 0 %
HCT: 37.4 % (ref 36.0–46.0)
Hemoglobin: 13 g/dL (ref 12.0–15.0)
Immature Granulocytes: 0 %
Lymphocytes Relative: 31 %
Lymphs Abs: 1.2 10*3/uL (ref 0.7–4.0)
MCH: 32.6 pg (ref 26.0–34.0)
MCHC: 34.8 g/dL (ref 30.0–36.0)
MCV: 93.7 fL (ref 80.0–100.0)
Monocytes Absolute: 0.5 10*3/uL (ref 0.1–1.0)
Monocytes Relative: 12 %
Neutro Abs: 2.1 10*3/uL (ref 1.7–7.7)
Neutrophils Relative %: 57 %
Platelet Count: 170 10*3/uL (ref 150–400)
RBC: 3.99 MIL/uL (ref 3.87–5.11)
RDW: 12.7 % (ref 11.5–15.5)
WBC Count: 3.7 10*3/uL — ABNORMAL LOW (ref 4.0–10.5)
nRBC: 0 % (ref 0.0–0.2)

## 2024-04-15 LAB — CMP (CANCER CENTER ONLY)
ALT: 16 U/L (ref 0–44)
AST: 20 U/L (ref 15–41)
Albumin: 4.4 g/dL (ref 3.5–5.0)
Alkaline Phosphatase: 90 U/L (ref 38–126)
Anion gap: 5 (ref 5–15)
BUN: 13 mg/dL (ref 8–23)
CO2: 30 mmol/L (ref 22–32)
Calcium: 9.2 mg/dL (ref 8.9–10.3)
Chloride: 106 mmol/L (ref 98–111)
Creatinine: 0.84 mg/dL (ref 0.44–1.00)
GFR, Estimated: >60 mL/min (ref >60)
Glucose, Bld: 93 mg/dL (ref 70–99)
Potassium: 4.9 mmol/L (ref 3.5–5.1)
Sodium: 141 mmol/L (ref 135–145)
Total Bilirubin: 0.6 mg/dL (ref 0.0–1.2)
Total Protein: 6.3 g/dL — ABNORMAL LOW (ref 6.5–8.1)

## 2024-04-15 LAB — SAVE SMEAR(SSMR), FOR PROVIDER SLIDE REVIEW

## 2024-04-15 MED ORDER — VENETOCLAX 100 MG PO TABS
200.0000 mg | ORAL_TABLET | Freq: Every day | ORAL | 3 refills | Status: DC
  Filled 2024-04-15 – 2024-04-20 (×2): qty 60, 30d supply, fill #0
  Filled 2024-04-27: qty 56, 28d supply, fill #0
  Filled 2024-05-21: qty 56, 28d supply, fill #1
  Filled 2024-06-17: qty 56, 28d supply, fill #2
  Filled 2024-07-08: qty 56, 28d supply, fill #3

## 2024-04-15 NOTE — Progress Notes (Signed)
 Hematology and Oncology Follow Up Visit  Sara Sanford [id number] [birth year] 68 y.o. 04/15/2024   Principle Diagnosis:                CLL FISH panel: (+) for QMV(78I69); neg for del(17p) or                t(11;14)             IgHV unmutated                Current Therapy: Calquence  100 mg PO BID -- d/c on 07/15/2022 Venetoclax  200 mg po q day -- start on -08/2020 -change on 04/15/2024   Interim History:  Sara Sanford is here today for follow-up.  She did have a bone marrow biopsy.  We went and did this for evaluation of the CLL.  This was done on 03/11/2024.  The pathology report ([id number]) showed a normocellular marrow.  She still had about 20% monoclonal lymphocytes.  As such, we are going to have to make an adjustment to her protocol.  I think we probably have to increase her venetoclax  up to 200 mg a day.  I think this would be a reasonable try.  If this does not work, then we will have to switch her over to a different BKI agent.  She feels okay.  She is hard of hearing.  Her weight is still pretty high.  She is trying to lose a little bit of weight..  She has had no change in bowel or bladder habits.  She has had no cough.  She has had no rashes.  There has been a little bit of chronic leg swelling..  She has had no headache.  Overall, I would say that her performance status is probably ECOG 1.    Medications:  Allergies as of 04/15/2024       Reactions   Propoxyphene Anaphylaxis, Shortness Of Breath, Other (See Comments)   Hallucinations   Keflex  [cephalexin ] Diarrhea   Nitrofurantoin  Diarrhea, Rash   Welts with blisters        Medication List        Accurate as of Apr 15, 2024  1:50 PM. If you have any questions, ask your nurse or doctor.          clobetasol  ointment 0.05 % Commonly known as: TEMOVATE  Apply to affected area every night for 4 weeks, then every other day for 4 weeks and then twice a week for 4 weeks or until resolution. May also take prn    cyanocobalamin  1000 MCG tablet Commonly known as: VITAMIN B12 Take 1,000 mcg by mouth daily.   fluconazole  100 MG tablet Commonly known as: DIFLUCAN  Take 1 tablet (100 mg total) by mouth daily. Take if you think you are getting a yeast infection.   FLUoxetine  20 MG capsule Commonly known as: PROZAC  TAKE 1 CAPSULE BY MOUTH DAILY   folic acid  1 MG tablet Commonly known as: FOLVITE  TAKE TWO TABLETS BY MOUTH DAILY   nitroGLYCERIN  0.4 MG SL tablet Commonly known as: NITROSTAT  Place 1 tablet (0.4 mg total) under the tongue every 5 (five) minutes as needed.   omeprazole  20 MG capsule Commonly known as: PRILOSEC Take 20 mg by mouth every other day.   sulfamethoxazole -trimethoprim  800-160 MG tablet Commonly known as: BACTRIM  DS Take 1 tablet by mouth 2 (two) times daily.   Venclexta  100 MG tablet Generic drug: venetoclax  Take 1 tablet (100 mg total) by mouth daily.  Allergies:  Allergies  Allergen Reactions   Propoxyphene Anaphylaxis, Shortness Of Breath and Other (See Comments)    Hallucinations    Keflex  [Cephalexin ] Diarrhea   Nitrofurantoin  Diarrhea and Rash    Welts with blisters    Past Medical History, Surgical history, Social history, and Family History were reviewed and updated.  Review of Systems: Review of Systems  Constitutional: Negative.   HENT: Negative.    Eyes: Negative.   Respiratory: Negative.    Cardiovascular: Negative.   Gastrointestinal: Negative.   Genitourinary:  Positive for dysuria, frequency and urgency.  Musculoskeletal: Negative.   Skin: Negative.   Neurological: Negative.   Endo/Heme/Allergies: Negative.   Psychiatric/Behavioral: Negative.       Physical Exam:  height is 5\' 2"  (1.575 m) and weight is 246 lb (111.6 kg). Her oral temperature is 97.5 F (36.4 C) (abnormal). Her blood pressure is 100/59 (abnormal) and her pulse is 60. Her respiration is 18 and oxygen saturation is 98%.   Wt Readings from Last 3  Encounters:  04/15/24 246 lb (111.6 kg)  03/11/24 240 lb (108.9 kg)  10/27/23 246 lb (111.6 kg)    Physical Exam Vitals reviewed.  HENT:     Head: Normocephalic and atraumatic.  Eyes:     Pupils: Pupils are equal, round, and reactive to light.  Cardiovascular:     Rate and Rhythm: Normal rate and regular rhythm.     Heart sounds: Normal heart sounds.  Pulmonary:     Effort: Pulmonary effort is normal.     Breath sounds: Normal breath sounds.  Abdominal:     General: Bowel sounds are normal.     Palpations: Abdomen is soft.  Musculoskeletal:        General: No tenderness or deformity. Normal range of motion.     Cervical back: Normal range of motion.  Lymphadenopathy:     Cervical: No cervical adenopathy.  Skin:    General: Skin is warm and dry.     Findings: No erythema or rash.  Neurological:     Mental Status: She is alert and oriented to person, place, and time.  Psychiatric:        Behavior: Behavior normal.        Thought Content: Thought content normal.        Judgment: Judgment normal.     Lab Results  Component Value Date   WBC 3.7 (L) 04/15/2024   HGB 13.0 04/15/2024   HCT 37.4 04/15/2024   MCV 93.7 04/15/2024   PLT 170 04/15/2024   Lab Results  Component Value Date   FERRITIN 25 05/15/2020   IRON 89 05/15/2020   TIBC 344 05/15/2020   UIBC 255 05/15/2020   IRONPCTSAT 26 05/15/2020   Lab Results  Component Value Date   RETICCTPCT 2.5 05/15/2020   RBC 3.99 04/15/2024   Lab Results  Component Value Date   KPAFRELGTCHN 38.9 (H) 10/15/2023   LAMBDASER 5.5 (L) 10/15/2023   KAPLAMBRATIO 7.07 (H) 10/15/2023   Lab Results  Component Value Date   IGGSERUM 533 (L) 10/15/2023   IGA 18 (L) 10/15/2023   IGMSERUM 94 10/15/2023   Lab Results  Component Value Date   TOTALPROTELP 6.0 10/15/2023   ALBUMINELP 4.0 04/14/2023   A1GS 0.2 04/14/2023   A2GS 0.7 04/14/2023   BETS 1.0 04/14/2023   GAMS 0.5 04/14/2023   MSPIKE Not Observed 04/14/2023    SPEI Comment 04/14/2023     Chemistry      Component Value Date/Time  NA 142 10/15/2023 1333   K 4.2 10/15/2023 1333   CL 106 10/15/2023 1333   CO2 29 10/15/2023 1333   BUN 13 10/15/2023 1333   CREATININE 0.78 10/15/2023 1333   CREATININE 0.69 02/01/2020 1149      Component Value Date/Time   CALCIUM  9.3 10/15/2023 1333   ALKPHOS 80 10/15/2023 1333   AST 20 10/15/2023 1333   ALT 15 10/15/2023 1333   BILITOT 0.7 10/15/2023 1333       Impression and Plan: Ms. Laurita is a very pleasant 68 yo caucasian female with CLL.  She still has, in my opinion, significant residual CLL in the bone marrow.  We are going to go ahead and increase the venetoclax  up to 200 mg a day.  Hopefully, this will further decrease the percentage.  We will plan to get her back in 6 weeks.  I probably would not treat another flow cytometry on her peripheral blood for about 3 months or so.  Ivor Mars, MD 5/8/20251:50 PM

## 2024-04-19 ENCOUNTER — Other Ambulatory Visit (HOSPITAL_COMMUNITY): Payer: Self-pay

## 2024-04-19 ENCOUNTER — Other Ambulatory Visit: Payer: Self-pay

## 2024-04-20 ENCOUNTER — Other Ambulatory Visit: Payer: Self-pay

## 2024-04-20 NOTE — Progress Notes (Signed)
 Specialty Pharmacy Refill Coordination Note  Sara Sanford is a 68 y.o. female contacted today regarding refills of specialty medication(s) Venetoclax  (VENCLEXTA )   Patient requested Delivery   Delivery date: 04/28/24   Verified address: [address]  [place], [place]. [address]   Medication will be filled on 04/27/24.

## 2024-04-20 NOTE — Progress Notes (Signed)
 Specialty Pharmacy Ongoing Clinical Assessment Note  Sara Sanford is a 68 y.o. female who is being followed by the specialty pharmacy service for RxSp Oncology   Patient's specialty medication(s) reviewed today: Venetoclax  (VENCLEXTA )   Missed doses in the last 4 weeks: 0   Patient/Caregiver did not have any additional questions or concerns.   Therapeutic benefit summary: Patient is achieving benefit   Adverse events/side effects summary: No adverse events/side effects   Patient's therapy is appropriate to: Continue    Goals Addressed             This Visit's Progress    Achieve or maintain remission       Patient is on track. Patient will maintain adherence at newly increased dose as stated in provider notes from 04/15/24.          Follow up: 6 months  Sara Sanford Specialty Pharmacist

## 2024-04-27 ENCOUNTER — Other Ambulatory Visit (HOSPITAL_COMMUNITY): Payer: Self-pay

## 2024-04-27 ENCOUNTER — Other Ambulatory Visit: Payer: Self-pay

## 2024-05-19 ENCOUNTER — Other Ambulatory Visit: Payer: Self-pay

## 2024-05-21 ENCOUNTER — Other Ambulatory Visit: Payer: Self-pay | Admitting: Pharmacy Technician

## 2024-05-21 ENCOUNTER — Other Ambulatory Visit: Payer: Self-pay

## 2024-05-21 NOTE — Progress Notes (Signed)
 Specialty Pharmacy Refill Coordination Note  Sara Sanford is a 68 y.o. female contacted today regarding refills of specialty medication(s) Venetoclax  (VENCLEXTA )   Patient requested Delivery   Delivery date: 05/25/24   Verified address: [address] [place]   Medication will be filled on 05/24/24.

## 2024-05-24 ENCOUNTER — Other Ambulatory Visit: Payer: Self-pay | Admitting: Family Medicine

## 2024-05-25 NOTE — Telephone Encounter (Signed)
 Please advise on refill request

## 2024-05-26 NOTE — Telephone Encounter (Signed)
 Needs app for August for med folllowup .

## 2024-05-27 ENCOUNTER — Inpatient Hospital Stay: Attending: Hematology & Oncology

## 2024-05-27 ENCOUNTER — Encounter: Payer: Self-pay | Admitting: Hematology & Oncology

## 2024-05-27 ENCOUNTER — Inpatient Hospital Stay (HOSPITAL_BASED_OUTPATIENT_CLINIC_OR_DEPARTMENT_OTHER): Admitting: Hematology & Oncology

## 2024-05-27 VITALS — BP 97/32 | HR 56 | Temp 97.6°F | Resp 18 | Ht 62.0 in | Wt 247.0 lb

## 2024-05-27 DIAGNOSIS — Z881 Allergy status to other antibiotic agents status: Secondary | ICD-10-CM | POA: Insufficient documentation

## 2024-05-27 DIAGNOSIS — R3915 Urgency of urination: Secondary | ICD-10-CM | POA: Insufficient documentation

## 2024-05-27 DIAGNOSIS — R300 Dysuria: Secondary | ICD-10-CM | POA: Insufficient documentation

## 2024-05-27 DIAGNOSIS — Z79899 Other long term (current) drug therapy: Secondary | ICD-10-CM | POA: Insufficient documentation

## 2024-05-27 DIAGNOSIS — C9110 Chronic lymphocytic leukemia of B-cell type not having achieved remission: Secondary | ICD-10-CM

## 2024-05-27 LAB — CMP (CANCER CENTER ONLY)
ALT: 26 U/L (ref 0–44)
AST: 20 U/L (ref 15–41)
Albumin: 4.1 g/dL (ref 3.5–5.0)
Alkaline Phosphatase: 88 U/L (ref 38–126)
Anion gap: 6 (ref 5–15)
BUN: 15 mg/dL (ref 8–23)
CO2: 28 mmol/L (ref 22–32)
Calcium: 8.8 mg/dL — ABNORMAL LOW (ref 8.9–10.3)
Chloride: 107 mmol/L (ref 98–111)
Creatinine: 0.86 mg/dL (ref 0.44–1.00)
GFR, Estimated: >60 mL/min (ref >60)
Glucose, Bld: 75 mg/dL (ref 70–99)
Potassium: 4.7 mmol/L (ref 3.5–5.1)
Sodium: 141 mmol/L (ref 135–145)
Total Bilirubin: 0.7 mg/dL (ref 0.0–1.2)
Total Protein: 6 g/dL — ABNORMAL LOW (ref 6.5–8.1)

## 2024-05-27 LAB — CBC WITH DIFFERENTIAL (CANCER CENTER ONLY)
Abs Immature Granulocytes: 0.01 10*3/uL (ref 0.00–0.07)
Basophils Absolute: 0 10*3/uL (ref 0.0–0.1)
Basophils Relative: 0 %
Eosinophils Absolute: 0 10*3/uL (ref 0.0–0.5)
Eosinophils Relative: 1 %
HCT: 34 % — ABNORMAL LOW (ref 36.0–46.0)
Hemoglobin: 11.7 g/dL — ABNORMAL LOW (ref 12.0–15.0)
Immature Granulocytes: 0 %
Lymphocytes Relative: 29 %
Lymphs Abs: 1 10*3/uL (ref 0.7–4.0)
MCH: 32.9 pg (ref 26.0–34.0)
MCHC: 34.4 g/dL (ref 30.0–36.0)
MCV: 95.5 fL (ref 80.0–100.0)
Monocytes Absolute: 0.4 10*3/uL (ref 0.1–1.0)
Monocytes Relative: 10 %
Neutro Abs: 2.1 10*3/uL (ref 1.7–7.7)
Neutrophils Relative %: 60 %
Platelet Count: 193 10*3/uL (ref 150–400)
RBC: 3.56 MIL/uL — ABNORMAL LOW (ref 3.87–5.11)
RDW: 13.1 % (ref 11.5–15.5)
WBC Count: 3.5 10*3/uL — ABNORMAL LOW (ref 4.0–10.5)
nRBC: 0 % (ref 0.0–0.2)

## 2024-05-27 LAB — SAVE SMEAR(SSMR), FOR PROVIDER SLIDE REVIEW

## 2024-05-27 LAB — LACTATE DEHYDROGENASE: LDH: 230 U/L — ABNORMAL HIGH (ref 98–192)

## 2024-05-27 MED ORDER — AMOXICILLIN-POT CLAVULANATE 875-125 MG PO TABS: 1.0000 | ORAL_TABLET | Freq: Two times a day (BID) | ORAL | 0 refills | Status: DC

## 2024-05-27 MED ORDER — BENZONATATE 100 MG PO CAPS: 100.0000 mg | ORAL_CAPSULE | Freq: Three times a day (TID) | ORAL | 2 refills | Status: DC | PRN

## 2024-05-27 NOTE — Telephone Encounter (Signed)
 Called Sara Sanford and Sara Sanford

## 2024-05-27 NOTE — Telephone Encounter (Signed)
 Patient scheduled for 07/15/24, thanks.

## 2024-05-27 NOTE — Progress Notes (Signed)
 Hematology and Oncology Follow Up Visit  Sara Sanford [id number] [birth year] 68 y.o. 05/27/2024   Principle Diagnosis:                CLL FISH panel: (+) for ONG(29B28); neg for del(17p) or                t(11;14)             IgHV unmutated                Current Therapy: Calquence  100 mg PO BID -- d/c on 07/15/2022 Venetoclax  200 mg po q day -- start on -08/2020 -change on 04/15/2024   Interim History:  Sara Sanford is here today for follow-up.  She has a cold.  She has had it for 2 weeks.  She been coughing up green sputum.  She says it is getting a little bit better.  I really think that we are going to have to get her on some antibiotic.  I will put her on some Augmentin .  I will also have her take some Tessalon pearls.    She now is on venetoclax  at 200 mg a day.  I think she is tolerating this pretty well.  She is having no diarrhea.  She is having no fever.  There is no bleeding.  She feels okay.  She is hard of hearing.  Her weight is still pretty high.  She is trying to lose a little bit of weight..  She has had no change in bowel or bladder habits.  he has had no rashes.  There has been a little bit of chronic leg swelling..  She has had no headache.  Overall, I would say that her performance status is probably ECOG 1.    Medications:  Allergies as of 05/27/2024       Reactions   Propoxyphene Anaphylaxis, Shortness Of Breath, Other (See Comments)   Hallucinations   Keflex  [cephalexin ] Diarrhea   Nitrofurantoin  Diarrhea, Rash   Welts with blisters        Medication List        Accurate as of May 27, 2024  2:00 PM. If you have any questions, ask your nurse or doctor.          STOP taking these medications    sulfamethoxazole -trimethoprim  800-160 MG tablet Commonly known as: BACTRIM  DS Stopped by: Ivor Mars       TAKE these medications    clobetasol  ointment 0.05 % Commonly known as: TEMOVATE  Apply to affected area every night for 4 weeks,  then every other day for 4 weeks and then twice a week for 4 weeks or until resolution. May also take prn   cyanocobalamin  1000 MCG tablet Commonly known as: VITAMIN B12 Take 1,000 mcg by mouth daily.   fluconazole  100 MG tablet Commonly known as: DIFLUCAN  Take 1 tablet (100 mg total) by mouth daily. Take if you think you are getting a yeast infection.   FLUoxetine  20 MG capsule Commonly known as: PROZAC  TAKE 1 CAPSULE BY MOUTH DAILY   folic acid  1 MG tablet Commonly known as: FOLVITE  TAKE TWO TABLETS BY MOUTH DAILY   nitroGLYCERIN  0.4 MG SL tablet Commonly known as: NITROSTAT  Place 1 tablet (0.4 mg total) under the tongue every 5 (five) minutes as needed.   omeprazole  20 MG capsule Commonly known as: PRILOSEC Take 20 mg by mouth every other day.   Venclexta  100 MG tablet Generic drug: venetoclax  Take 2 tablets (200  mg total) by mouth daily.        Allergies:  Allergies  Allergen Reactions   Propoxyphene Anaphylaxis, Shortness Of Breath and Other (See Comments)    Hallucinations    Keflex  [Cephalexin ] Diarrhea   Nitrofurantoin  Diarrhea and Rash    Welts with blisters    Past Medical History, Surgical history, Social history, and Family History were reviewed and updated.  Review of Systems: Review of Systems  Constitutional: Negative.   HENT: Negative.    Eyes: Negative.   Respiratory: Negative.    Cardiovascular: Negative.   Gastrointestinal: Negative.   Genitourinary:  Positive for dysuria, frequency and urgency.  Musculoskeletal: Negative.   Skin: Negative.   Neurological: Negative.   Endo/Heme/Allergies: Negative.   Psychiatric/Behavioral: Negative.       Physical Exam:  height is 5' 2 (1.575 m) and weight is 247 lb (112 kg). Her oral temperature is 97.6 F (36.4 C). Her blood pressure is 97/32 (abnormal) and her pulse is 56 (abnormal). Her respiration is 18 and oxygen saturation is 99%.   Wt Readings from Last 3 Encounters:  05/27/24 247 lb  (112 kg)  04/15/24 246 lb (111.6 kg)  03/11/24 240 lb (108.9 kg)    Physical Exam Vitals reviewed.  HENT:     Head: Normocephalic and atraumatic.   Eyes:     Pupils: Pupils are equal, round, and reactive to light.    Cardiovascular:     Rate and Rhythm: Normal rate and regular rhythm.     Heart sounds: Normal heart sounds.  Pulmonary:     Effort: Pulmonary effort is normal.     Breath sounds: Normal breath sounds.  Abdominal:     General: Bowel sounds are normal.     Palpations: Abdomen is soft.   Musculoskeletal:        General: No tenderness or deformity. Normal range of motion.     Cervical back: Normal range of motion.  Lymphadenopathy:     Cervical: No cervical adenopathy.   Skin:    General: Skin is warm and dry.     Findings: No erythema or rash.   Neurological:     Mental Status: She is alert and oriented to person, place, and time.   Psychiatric:        Behavior: Behavior normal.        Thought Content: Thought content normal.        Judgment: Judgment normal.      Lab Results  Component Value Date   WBC 3.5 (L) 05/27/2024   HGB 11.7 (L) 05/27/2024   HCT 34.0 (L) 05/27/2024   MCV 95.5 05/27/2024   PLT 193 05/27/2024   Lab Results  Component Value Date   FERRITIN 25 05/15/2020   IRON 89 05/15/2020   TIBC 344 05/15/2020   UIBC 255 05/15/2020   IRONPCTSAT 26 05/15/2020   Lab Results  Component Value Date   RETICCTPCT 2.5 05/15/2020   RBC 3.56 (L) 05/27/2024   Lab Results  Component Value Date   KPAFRELGTCHN 38.9 (H) 10/15/2023   LAMBDASER 5.5 (L) 10/15/2023   KAPLAMBRATIO 7.07 (H) 10/15/2023   Lab Results  Component Value Date   IGGSERUM 533 (L) 10/15/2023   IGA 18 (L) 10/15/2023   IGMSERUM 94 10/15/2023   Lab Results  Component Value Date   TOTALPROTELP 6.0 10/15/2023   ALBUMINELP 4.0 04/14/2023   A1GS 0.2 04/14/2023   A2GS 0.7 04/14/2023   BETS 1.0 04/14/2023   GAMS 0.5 04/14/2023  MSPIKE Not Observed 04/14/2023   SPEI  Comment 04/14/2023     Chemistry      Component Value Date/Time   NA 141 05/27/2024 1202   K 4.7 05/27/2024 1202   CL 107 05/27/2024 1202   CO2 28 05/27/2024 1202   BUN 15 05/27/2024 1202   CREATININE 0.86 05/27/2024 1202   CREATININE 0.69 02/01/2020 1149      Component Value Date/Time   CALCIUM  8.8 (L) 05/27/2024 1202   ALKPHOS 88 05/27/2024 1202   AST 20 05/27/2024 1202   ALT 26 05/27/2024 1202   BILITOT 0.7 05/27/2024 1202       Impression and Plan: Ms. Ferraiolo is a very pleasant 68 yo caucasian female with CLL.  She still has, in my opinion, significant residual CLL in the bone marrow.  I will get her back in 2 months.  At that time, I will check a flow cytometry on her to see about the percentage of monoclonal lymphocytes.  If we still see a significant percentage, then we will have to make another change in her protocol and get her on one of the newer BKI inhibitors.  Ivor Mars, MD 6/19/20252:00 PM

## 2024-06-16 ENCOUNTER — Encounter (INDEPENDENT_AMBULATORY_CARE_PROVIDER_SITE_OTHER): Payer: Self-pay

## 2024-06-17 ENCOUNTER — Other Ambulatory Visit: Payer: Self-pay

## 2024-06-17 ENCOUNTER — Other Ambulatory Visit: Payer: Self-pay | Admitting: Pharmacy Technician

## 2024-06-17 NOTE — Progress Notes (Signed)
 Specialty Pharmacy Refill Coordination Note  Sara Sanford is a 68 y.o. female contacted today regarding refills of specialty medication(s) Venetoclax  (VENCLEXTA )   Patient requested (Patient-Rptd) Delivery   Delivery date: 06/18/24 Verified address: (Patient-Rptd) [address]  [place]. [address]   Medication will be filled on 06/17/24.

## 2024-07-08 ENCOUNTER — Other Ambulatory Visit: Payer: Self-pay

## 2024-07-08 NOTE — Progress Notes (Signed)
 Specialty Pharmacy Refill Coordination Note  Sara Sanford is a 68 y.o. female contacted today regarding refills of specialty medication(s) Venetoclax  (VENCLEXTA )   Patient requested Delivery   Delivery date: 07/09/24   Verified address: Patient address [address]  [place] [facility] [address]   Medication will be filled on 07/08/24

## 2024-07-15 ENCOUNTER — Ambulatory Visit (INDEPENDENT_AMBULATORY_CARE_PROVIDER_SITE_OTHER): Admitting: Family Medicine

## 2024-07-15 ENCOUNTER — Encounter: Payer: Self-pay | Admitting: Family Medicine

## 2024-07-15 VITALS — BP 120/47 | HR 58 | Ht 62.0 in | Wt 245.9 lb

## 2024-07-15 DIAGNOSIS — F411 Generalized anxiety disorder: Secondary | ICD-10-CM | POA: Diagnosis not present

## 2024-07-15 DIAGNOSIS — I700 Atherosclerosis of aorta: Secondary | ICD-10-CM

## 2024-07-15 DIAGNOSIS — C9110 Chronic lymphocytic leukemia of B-cell type not having achieved remission: Secondary | ICD-10-CM | POA: Diagnosis not present

## 2024-07-15 DIAGNOSIS — E782 Mixed hyperlipidemia: Secondary | ICD-10-CM | POA: Diagnosis not present

## 2024-07-15 MED ORDER — FLUOXETINE HCL 20 MG PO CAPS: 20.0000 mg | ORAL_CAPSULE | Freq: Every day | ORAL | 3 refills | Status: AC

## 2024-07-15 NOTE — Assessment & Plan Note (Signed)
 Happy with her current regimen.  She feels like it is made a huge difference in her life and being able to control her panic attacks and her anxiety.  Refilled med for 1 more year.  We did discuss that we could always try decreasing the dose or tapering off at some point if she would like to try it.  For now she was gena continue with current regimen.

## 2024-07-15 NOTE — Assessment & Plan Note (Signed)
 Close with oncology currently under treatment.  She does take folic acid .

## 2024-07-15 NOTE — Assessment & Plan Note (Signed)
 Due for for updated lipid levels.

## 2024-07-15 NOTE — Assessment & Plan Note (Signed)
Not currently on a statin 

## 2024-07-15 NOTE — Progress Notes (Signed)
 .  Established Patient Office Visit  Subjective  Patient ID: Sara Sanford, female    DOB: 04-23-1956  Age: 68 y.o. MRN: [id number]  Chief Complaint  Patient presents with   mood    HPI  She is here today to follow-up on her mood she is currently on fluoxetine  20 mg she is very happy with her regimen.  She says ever since she got on that years ago it makes such a huge difference in her anxiety and her panic attacks.  In regards to her CLL she is now on Venclexta  only.  She states she had tolerating it really well she really has not had a lot of problems or side effects.  She follows with Dr. Timmy.  She does take her omeprazole  every other day they wanted her taking it less frequently because of her other medications she is allowed to take an occasional Tums.  But she still gets some occasional breakthrough heartburn.     ROS    Objective:     BP (!) 120/47   Pulse (!) 58   Ht 5' 2 (1.575 m)   Wt 245 lb 14.4 oz (111.5 kg)   SpO2 97%   BMI 44.98 kg/m    Physical Exam Vitals and nursing note reviewed.  Constitutional:      Appearance: Normal appearance.  HENT:     Head: Normocephalic and atraumatic.  Eyes:     Conjunctiva/sclera: Conjunctivae normal.  Cardiovascular:     Rate and Rhythm: Normal rate and regular rhythm.  Pulmonary:     Effort: Pulmonary effort is normal.     Breath sounds: Normal breath sounds.  Skin:    General: Skin is warm and dry.  Neurological:     Mental Status: She is alert.  Psychiatric:        Mood and Affect: Mood normal.      No results found for any visits on 07/15/24.    The 10-year ASCVD risk score (Arnett DK, et al., 2019) is: 7.6%    Assessment & Plan:   Problem List Items Addressed This Visit       Cardiovascular and Mediastinum   Aortic atherosclerosis (HCC)   Not currently on a statin.        Other   Hyperlipidemia   Due for for updated lipid levels.      Relevant Orders   Lipid panel   TSH    GAD (generalized anxiety disorder) - Primary   Happy with her current regimen.  She feels like it is made a huge difference in her life and being able to control her panic attacks and her anxiety.  Refilled med for 1 more year.  We did discuss that we could always try decreasing the dose or tapering off at some point if she would like to try it.  For now she was gena continue with current regimen.      Relevant Medications   FLUoxetine  (PROZAC ) 20 MG capsule   Other Relevant Orders   Lipid panel   TSH   CLL (chronic lymphocytic leukemia) (HCC)   Close with oncology currently under treatment.  She does take folic acid .       We did discuss need for pneumonia vaccine and Tdap.  As well as shingles vaccine.  She said she actually tried to get her shingles vaccine done at 1 point with the pharmacy and they would not do it because of her medications.  Did encourage her to at  least go ahead and get her Tdap.  In regards to the pneumonia vaccine we can either do that here or at the pharmacy.  Return in about 1 year (around 07/15/2025) for Mood.    Dorothyann Byars, MD

## 2024-07-16 ENCOUNTER — Ambulatory Visit: Payer: Self-pay | Admitting: Family Medicine

## 2024-07-16 LAB — LIPID PANEL
Chol/HDL Ratio: 4.4 ratio (ref 0.0–4.4)
Cholesterol, Total: 225 mg/dL — ABNORMAL HIGH (ref 100–199)
HDL: 51 mg/dL (ref >39)
LDL Chol Calc (NIH): 156 mg/dL — ABNORMAL HIGH (ref 0–99)
Triglycerides: 102 mg/dL (ref 0–149)
VLDL Cholesterol Cal: 18 mg/dL (ref 5–40)

## 2024-07-16 LAB — TSH: TSH: 2.17 u[IU]/mL (ref 0.450–4.500)

## 2024-07-16 NOTE — Progress Notes (Signed)
 Hi Sara Sanford, LDL and total cholesterol are elevated.  Triglycerides and HDL are okay.  Continue to work on [employer].  Also consider starting a statin at this point as your cardiovascular risk score is around 8% for cardiovascular event in the next 10 years.  Thyroid  level looks good.  The 10-year ASCVD risk score (Arnett DK, et al., 2019) is: 8%   Values used to calculate the score:     Age: 68 years     Clincally relevant sex: Female     Is Non-Hispanic African American: No     Diabetic: No     Tobacco smoker: No     Systolic Blood Pressure: 120 mmHg     Is BP treated: No     HDL Cholesterol: 51 mg/dL     Total Cholesterol: 225 mg/dL

## 2024-07-29 ENCOUNTER — Other Ambulatory Visit: Payer: Self-pay

## 2024-07-29 ENCOUNTER — Other Ambulatory Visit: Payer: Self-pay | Admitting: Hematology & Oncology

## 2024-07-29 ENCOUNTER — Other Ambulatory Visit: Payer: Self-pay | Admitting: Pharmacy Technician

## 2024-07-29 DIAGNOSIS — C9110 Chronic lymphocytic leukemia of B-cell type not having achieved remission: Secondary | ICD-10-CM

## 2024-07-29 MED ORDER — VENETOCLAX 100 MG PO TABS
200.0000 mg | ORAL_TABLET | Freq: Every day | ORAL | 3 refills | Status: DC
  Filled 2024-07-29: qty 56, 28d supply, fill #0
  Filled 2024-09-02: qty 56, 28d supply, fill #1
  Filled 2024-09-24: qty 56, 28d supply, fill #2
  Filled 2024-10-14: qty 56, 28d supply, fill #3

## 2024-07-29 NOTE — Progress Notes (Signed)
 Specialty Pharmacy Refill Coordination Note  Sara Sanford is a 68 y.o. female contacted today regarding refills of specialty medication(s) Venetoclax  (VENCLEXTA )   Patient requested Delivery   Delivery date: 08/05/24   Verified address: [address]  [place]   Medication will be filled on 08/04/24.  This fill date is pending response to refill request from provider. Patient is aware and if they have not received fill by intended date they must follow up with pharmacy.

## 2024-08-04 ENCOUNTER — Other Ambulatory Visit: Payer: Self-pay

## 2024-08-05 ENCOUNTER — Encounter: Payer: Self-pay | Admitting: Hematology & Oncology

## 2024-08-05 ENCOUNTER — Inpatient Hospital Stay: Attending: Hematology & Oncology

## 2024-08-05 ENCOUNTER — Other Ambulatory Visit: Payer: Self-pay

## 2024-08-05 ENCOUNTER — Inpatient Hospital Stay (HOSPITAL_BASED_OUTPATIENT_CLINIC_OR_DEPARTMENT_OTHER): Admitting: Hematology & Oncology

## 2024-08-05 VITALS — BP 104/38 | HR 51 | Temp 98.2°F | Resp 18 | Ht 62.0 in | Wt 246.0 lb

## 2024-08-05 DIAGNOSIS — Z79899 Other long term (current) drug therapy: Secondary | ICD-10-CM | POA: Insufficient documentation

## 2024-08-05 DIAGNOSIS — C9110 Chronic lymphocytic leukemia of B-cell type not having achieved remission: Secondary | ICD-10-CM | POA: Insufficient documentation

## 2024-08-05 DIAGNOSIS — Z881 Allergy status to other antibiotic agents status: Secondary | ICD-10-CM | POA: Diagnosis not present

## 2024-08-05 LAB — CBC WITH DIFFERENTIAL (CANCER CENTER ONLY)
Abs Immature Granulocytes: 0.01 K/uL (ref 0.00–0.07)
Basophils Absolute: 0 K/uL (ref 0.0–0.1)
Basophils Relative: 0 %
Eosinophils Absolute: 0 K/uL (ref 0.0–0.5)
Eosinophils Relative: 0 %
HCT: 35.6 % — ABNORMAL LOW (ref 36.0–46.0)
Hemoglobin: 12.5 g/dL (ref 12.0–15.0)
Immature Granulocytes: 0 %
Lymphocytes Relative: 31 %
Lymphs Abs: 1 K/uL (ref 0.7–4.0)
MCH: 33.2 pg (ref 26.0–34.0)
MCHC: 35.1 g/dL (ref 30.0–36.0)
MCV: 94.7 fL (ref 80.0–100.0)
Monocytes Absolute: 0.3 K/uL (ref 0.1–1.0)
Monocytes Relative: 11 %
Neutro Abs: 1.8 K/uL (ref 1.7–7.7)
Neutrophils Relative %: 58 %
Platelet Count: 160 K/uL (ref 150–400)
RBC: 3.76 MIL/uL — ABNORMAL LOW (ref 3.87–5.11)
RDW: 12.8 % (ref 11.5–15.5)
WBC Count: 3.2 K/uL — ABNORMAL LOW (ref 4.0–10.5)
nRBC: 0 % (ref 0.0–0.2)

## 2024-08-05 LAB — CMP (CANCER CENTER ONLY)
ALT: 19 U/L (ref 0–44)
AST: 26 U/L (ref 15–41)
Albumin: 4.2 g/dL (ref 3.5–5.0)
Alkaline Phosphatase: 90 U/L (ref 38–126)
Anion gap: 11 (ref 5–15)
BUN: 8 mg/dL (ref 8–23)
CO2: 24 mmol/L (ref 22–32)
Calcium: 9.2 mg/dL (ref 8.9–10.3)
Chloride: 107 mmol/L (ref 98–111)
Creatinine: 0.8 mg/dL (ref 0.44–1.00)
GFR, Estimated: >60 mL/min (ref >60)
Glucose, Bld: 95 mg/dL (ref 70–99)
Potassium: 4.4 mmol/L (ref 3.5–5.1)
Sodium: 142 mmol/L (ref 135–145)
Total Bilirubin: 0.7 mg/dL (ref 0.0–1.2)
Total Protein: 6.3 g/dL — ABNORMAL LOW (ref 6.5–8.1)

## 2024-08-05 LAB — LACTATE DEHYDROGENASE: LDH: 250 U/L — ABNORMAL HIGH (ref 98–192)

## 2024-08-05 LAB — SAVE SMEAR(SSMR), FOR PROVIDER SLIDE REVIEW

## 2024-08-05 NOTE — Progress Notes (Signed)
 Hematology and Oncology Follow Up Visit  Sara Sanford [id number] September 19, 1956 68 y.o. 08/05/2024   Principle Diagnosis:                CLL FISH panel: (+) for izo(86v85); neg for del(17p) or                t(11;14)             IgHV unmutated                Current Therapy: Calquence  100 mg PO BID -- d/c on 07/15/2022 Venetoclax  200 mg po q day -- start on -08/2020 -change on 04/15/2024   Interim History:  Sara Sanford is here today for follow-up.  The big news is that she will be 68 years old in a couple weeks.  I am very happy for her.  I am sure that she will have a wonderful birthday.  Otherwise, she is doing okay.  She had a very quiet summer.  She really did not travel anywhere.  She has had no problems with infections.  She has had no fever.  She has had no change in bowel or bladder habits.  She has had no cough or shortness of breath.  She has had no rashes.  There has been no bleeding.  She continues on venetoclax .  She is doing well on the venetoclax .  Currently, I would have to say that her performance status is probably ECOG 1.    Medications:  Allergies as of 08/05/2024       Reactions   Propoxyphene Anaphylaxis, Shortness Of Breath, Other (See Comments)   Hallucinations   Keflex  [cephalexin ] Diarrhea   Nitrofurantoin  Diarrhea, Rash   Welts with blisters        Medication List        Accurate as of August 05, 2024  2:29 PM. If you have any questions, ask your nurse or doctor.          clobetasol  ointment 0.05 % Commonly known as: TEMOVATE  Apply to affected area every night for 4 weeks, then every other day for 4 weeks and then twice a week for 4 weeks or until resolution. May also take prn   cyanocobalamin  1000 MCG tablet Commonly known as: VITAMIN B12 Take 1,000 mcg by mouth daily.   FLUoxetine  20 MG capsule Commonly known as: PROZAC  Take 1 capsule (20 mg total) by mouth daily.   folic acid  1 MG tablet Commonly known as: FOLVITE  TAKE TWO  TABLETS BY MOUTH DAILY   omeprazole  20 MG capsule Commonly known as: PRILOSEC Take 20 mg by mouth every other day.   Venclexta  100 MG tablet Generic drug: venetoclax  Take 2 tablets (200 mg total) by mouth daily.        Allergies:  Allergies  Allergen Reactions   Propoxyphene Anaphylaxis, Shortness Of Breath and Other (See Comments)    Hallucinations    Keflex  [Cephalexin ] Diarrhea   Nitrofurantoin  Diarrhea and Rash    Welts with blisters    Past Medical History, Surgical history, Social history, and Family History were reviewed and updated.  Review of Systems: Review of Systems  Constitutional: Negative.   HENT: Negative.    Eyes: Negative.   Respiratory: Negative.    Cardiovascular: Negative.   Gastrointestinal: Negative.   Genitourinary:  Positive for dysuria, frequency and urgency.  Musculoskeletal: Negative.   Skin: Negative.   Neurological: Negative.   Endo/Heme/Allergies: Negative.   Psychiatric/Behavioral: Negative.  Physical Exam:  height is 5' 2 (1.575 m) and weight is 246 lb (111.6 kg). Her oral temperature is 98.2 F (36.8 C). Her blood pressure is 104/38 (abnormal) and her pulse is 51 (abnormal). Her respiration is 18 and oxygen saturation is 98%.   Wt Readings from Last 3 Encounters:  08/05/24 246 lb (111.6 kg)  07/15/24 245 lb 14.4 oz (111.5 kg)  05/27/24 247 lb (112 kg)    Physical Exam Vitals reviewed.  HENT:     Head: Normocephalic and atraumatic.  Eyes:     Pupils: Pupils are equal, round, and reactive to light.  Cardiovascular:     Rate and Rhythm: Normal rate and regular rhythm.     Heart sounds: Normal heart sounds.  Pulmonary:     Effort: Pulmonary effort is normal.     Breath sounds: Normal breath sounds.  Abdominal:     General: Bowel sounds are normal.     Palpations: Abdomen is soft.  Musculoskeletal:        General: No tenderness or deformity. Normal range of motion.     Cervical back: Normal range of motion.   Lymphadenopathy:     Cervical: No cervical adenopathy.  Skin:    General: Skin is warm and dry.     Findings: No erythema or rash.  Neurological:     Mental Status: She is alert and oriented to person, place, and time.  Psychiatric:        Behavior: Behavior normal.        Thought Content: Thought content normal.        Judgment: Judgment normal.      Lab Results  Component Value Date   WBC 3.2 (L) 08/05/2024   HGB 12.5 08/05/2024   HCT 35.6 (L) 08/05/2024   MCV 94.7 08/05/2024   PLT 160 08/05/2024   Lab Results  Component Value Date   FERRITIN 25 05/15/2020   IRON 89 05/15/2020   TIBC 344 05/15/2020   UIBC 255 05/15/2020   IRONPCTSAT 26 05/15/2020   Lab Results  Component Value Date   RETICCTPCT 2.5 05/15/2020   RBC 3.76 (L) 08/05/2024   Lab Results  Component Value Date   KPAFRELGTCHN 38.9 (H) 10/15/2023   LAMBDASER 5.5 (L) 10/15/2023   KAPLAMBRATIO 7.07 (H) 10/15/2023   Lab Results  Component Value Date   IGGSERUM 533 (L) 10/15/2023   IGA 18 (L) 10/15/2023   IGMSERUM 94 10/15/2023   Lab Results  Component Value Date   TOTALPROTELP 6.0 10/15/2023   ALBUMINELP 4.0 04/14/2023   A1GS 0.2 04/14/2023   A2GS 0.7 04/14/2023   BETS 1.0 04/14/2023   GAMS 0.5 04/14/2023   MSPIKE Not Observed 04/14/2023   SPEI Comment 04/14/2023     Chemistry      Component Value Date/Time   NA 142 08/05/2024 1315   K 4.4 08/05/2024 1315   CL 107 08/05/2024 1315   CO2 24 08/05/2024 1315   BUN 8 08/05/2024 1315   CREATININE 0.80 08/05/2024 1315   CREATININE 0.69 02/01/2020 1149      Component Value Date/Time   CALCIUM  9.2 08/05/2024 1315   ALKPHOS 90 08/05/2024 1315   AST 26 08/05/2024 1315   ALT 19 08/05/2024 1315   BILITOT 0.7 08/05/2024 1315       Impression and Plan: Sara Sanford is a very pleasant 68 yo caucasian female with CLL.  She still has, in my opinion, significant residual CLL in the bone marrow.  I just happy  that everything is going well for her.   I would not make any changes with the venetoclax .  I know that she will have a nice birthday.  I would like to see her back in October or early November.  If all looks good, then we might want to consider getting her through the Holiday season.    Sara JONELLE Crease, MD 8/28/20252:29 PM

## 2024-08-06 LAB — IGG, IGA, IGM
IgA: 28 mg/dL — ABNORMAL LOW (ref 87–352)
IgG (Immunoglobin G), Serum: 606 mg/dL (ref 586–1602)
IgM (Immunoglobulin M), Srm: 68 mg/dL (ref 26–217)

## 2024-08-12 LAB — SURGICAL PATHOLOGY

## 2024-08-12 LAB — FLOW CYTOMETRY

## 2024-08-23 ENCOUNTER — Other Ambulatory Visit: Payer: Self-pay | Admitting: Hematology & Oncology

## 2024-08-25 ENCOUNTER — Ambulatory Visit

## 2024-08-25 ENCOUNTER — Other Ambulatory Visit: Payer: Self-pay

## 2024-08-25 ENCOUNTER — Ambulatory Visit
Admission: EM | Admit: 2024-08-25 | Discharge: 2024-08-25 | Disposition: A | Discharge status: Home / Self Care | Attending: Family Medicine | Admitting: Family Medicine

## 2024-08-25 ENCOUNTER — Ambulatory Visit: Payer: Self-pay

## 2024-08-25 DIAGNOSIS — S62115A Nondisplaced fracture of triquetrum [cuneiform] bone, left wrist, initial encounter for closed fracture: Secondary | ICD-10-CM | POA: Diagnosis not present

## 2024-08-25 DIAGNOSIS — Y92009 Unspecified place in unspecified non-institutional (private) residence as the place of occurrence of the external cause: Secondary | ICD-10-CM

## 2024-08-25 DIAGNOSIS — M25532 Pain in left wrist: Secondary | ICD-10-CM

## 2024-08-25 DIAGNOSIS — W19XXXA Unspecified fall, initial encounter: Secondary | ICD-10-CM

## 2024-08-25 DIAGNOSIS — M8580 Other specified disorders of bone density and structure, unspecified site: Secondary | ICD-10-CM

## 2024-08-25 DIAGNOSIS — Z780 Asymptomatic menopausal state: Secondary | ICD-10-CM

## 2024-08-25 NOTE — Telephone Encounter (Signed)
 Patient seen today at urgent care.

## 2024-08-25 NOTE — Telephone Encounter (Signed)
 FYI Only or Action Required?: Action required by provider: request for appointment.  Patient was last seen in primary care on 07/15/2024 by Alvan Dorothyann BIRCH, MD.  Called Nurse Triage reporting Fall.  Symptoms began yesterday.  Interventions attempted: OTC medications: tylenol, ice.  Symptoms are: unchanged.  Triage Disposition: See PCP Within 2 Weeks  Patient/caregiver understands and will follow disposition?: Yes   Copied from CRM [id number]. Topic: Clinical - Red Word Triage >> Aug 25, 2024  1:09 PM Fredrica W wrote: Red Word that prompted transfer to Nurse Triage: Left wrist - fell last night Reason for Disposition  [1] Falling (two or more unexpected falls) AND [2] in past year  Answer Assessment - Initial Assessment Questions No available appts at PCP clinic, offered appt today at alternative clinic. Pt declined and reports will go to UC.  Advised call back if symptoms worsen.  1. MECHANISM: How did the fall happen? Reaching in refrigerator for apples and slipped and fell, fell on knees. Patient denies head injury, dizziness, weakness.       3. ONSET: When did the fall happen? (e.g., minutes, hours, or days ago)     Last night 4. LOCATION: What part of the body hit the ground? (e.g., back, buttocks, head, hips, knees, hands, head, stomach)     Left wrist; 7/10, ice, painful with movement, thumb, fingers swollen, denies discolored, bruising or cuts  both knees 5. INJURY: Did you hurt (injure) yourself when you fell? If Yes, ask: What did you injure? Tell me more about this? (e.g., body area; type of injury; pain severity)     Slipped and fell when reaching 6. PAIN: Is there any pain? If Yes, ask: How bad is the pain? (e.g., Scale 0-10; or none, mild,      7/10 7. SIZE: For cuts, bruises, or swelling, ask: How large is it? (e.g., inches or centimeters)      no  9. OTHER SYMPTOMS: Do you have any other symptoms? (e.g., dizziness, fever, weakness;  new-onset or worsening).      Denies dizziness, weakness,new meds  10. CAUSE: What do you think caused the fall (or falling)? (e.g., dizzy spell, tripped) Pt reports Balance not as good as it use to be,  denies dizziness/ weakness  Protocols used: Falls and [facility]

## 2024-08-25 NOTE — ED Provider Notes (Signed)
 [facility]    CSN: [id number] Arrival date & time: 08/25/24  1410      History   Chief Complaint No chief complaint on file.   HPI Sara Sanford is a 68 y.o. female.   Patient tripped and fell in her kitchen last night.  She fell forward.  She landed on her knees and both outstretched hands.  Rolled over onto her hip.  Today the only painful area remains her left wrist.  It swollen, painful, pain with movement.  She is here for evaluation Patient is under care of oncology for CLL Patient had her last DEXA scan in 2021 and it did show osteopenia    Past Medical History:  Diagnosis Date   Abdominal aortic aneurysm (HCC) 08/01/2014   3.2 cm seen on US  07/2014. No change 02/2018. Repeat in 3 yr.    Allergy    Anxiety    Aortic atherosclerosis (HCC) 08/23/2021   Arthritis    Cholelithiasis 08/17/2014   CLL (chronic lymphocytic leukemia) (HCC) 08/12/2019   GAD (generalized anxiety disorder) 02/17/2017   GERD 05/22/2009   Qualifier: Diagnosis of  By: Waylan DO, Karen     Heart burn    peptic ulcer   Hyperlipidemia 01/20/2020   Kidney stones    Left leg pain 09/09/2019   LEG EDEMA, BILATERAL 05/22/2009   Qualifier: Diagnosis of  By: Waylan DO, Karen     Lichen sclerosus    Macrocytic anemia 09/07/2019   OBESITY 05/22/2009   Qualifier: Diagnosis of  By: Waylan ROSALEA Pao     POLYARTHRITIS 06/19/2010   Qualifier: Diagnosis of  By: Waylan DO, Karen     Post-menopausal    Primary osteoarthritis of left knee 09/09/2019   Rash and nonspecific skin eruption 01/03/2020   Recurrent UTI 06/30/2019    Patient Active Problem List   Diagnosis Date Noted   Osteopenia after menopause 08/25/2024   Morbid obesity (HCC) 10/11/2021   Chest pain of uncertain etiology 10/11/2021   Cardiac murmur 10/11/2021   Anxiety 09/26/2021   Heart burn 09/26/2021   Kidney stones 09/26/2021   Lichen sclerosus 09/26/2021   Post-menopausal 09/26/2021   Aortic atherosclerosis (HCC) 08/23/2021    Hyperlipidemia 01/20/2020   Primary osteoarthritis of left knee 09/09/2019   Macrocytic anemia 09/07/2019   CLL (chronic lymphocytic leukemia) (HCC) 08/12/2019   Recurrent UTI 06/30/2019   GAD (generalized anxiety disorder) 02/17/2017   Cholelithiasis 08/17/2014   Abdominal aortic aneurysm (HCC) 08/01/2014   POLYARTHRITIS 06/19/2010   GERD 05/22/2009   LEG EDEMA, BILATERAL 05/22/2009    Past Surgical History:  Procedure Laterality Date   CARDIAC CATHETERIZATION  2008   CHOLECYSTECTOMY     COLONOSCOPY  2010   [place], [place]   DILATION AND CURETTAGE OF UTERUS     ESOPHAGOGASTRODUODENOSCOPY  2005   [place], [place] had egd and colon at the same time    GALLBLADDER SURGERY  2015   HYSTEROSCOPY WITH D & C  2008   KIDNEY STONE SURGERY  1977   UPPER GASTROINTESTINAL ENDOSCOPY      OB History     Gravida  4   Para  3   Term      Preterm      AB  1   Living         SAB  1   IAB      Ectopic      Multiple      Live Births  Home Medications    Prior to Admission medications   Medication Sig Start Date End Date Taking? Authorizing Provider  clobetasol  ointment (TEMOVATE ) 0.05 % Apply to affected area every night for 4 weeks, then every other day for 4 weeks and then twice a week for 4 weeks or until resolution. May also take prn 10/10/22   Cleatus Moccasin, MD  FLUoxetine  (PROZAC ) 20 MG capsule Take 1 capsule (20 mg total) by mouth daily. 07/15/24   Alvan Dorothyann BIRCH, MD  folic acid  (FOLVITE ) 1 MG tablet TAKE 2 TABLETS BY MOUTH DAILY 08/23/24   Timmy Maude SAUNDERS, MD  omeprazole  (PRILOSEC) 20 MG capsule Take 20 mg by mouth every other day.    [provider]  venetoclax  (VENCLEXTA ) 100 MG tablet Take 2 tablets (200 mg total) by mouth daily. 07/29/24 09/01/24  Timmy Maude SAUNDERS, MD  vitamin B-12 (CYANOCOBALAMIN ) 1000 MCG tablet Take 1,000 mcg by mouth daily.    [provider]    Family History Family History  Problem Relation Age  of Onset   Breast cancer Paternal Aunt        pm breast CA   Breast cancer Paternal Aunt        PM breast CA   Heart attack Father    Hypertension Father    Colon polyps Father    Hypertension Mother    Stomach cancer Maternal Grandmother    Colon cancer Neg Hx    Rectal cancer Neg Hx     Social History Social History   Tobacco Use   Smoking status: Never   Smokeless tobacco: Never  Vaping Use   Vaping status: Never Used  Substance Use Topics   Alcohol use: No   Drug use: Never     Allergies   Propoxyphene, Keflex  [cephalexin ], and Nitrofurantoin    Review of Systems Review of Systems  See HPI Physical Exam Triage Vital Signs ED Triage Vitals  Encounter Vitals Group     BP 08/25/24 1417 131/81     Girls Systolic BP Percentile --      Girls Diastolic BP Percentile --      Boys Systolic BP Percentile --      Boys Diastolic BP Percentile --      Pulse Rate 08/25/24 1417 95     Resp 08/25/24 1417 18     Temp 08/25/24 1417 98 F (36.7 C)     Temp src --      SpO2 08/25/24 1417 95 %     Weight --      Height --      Head Circumference --      Peak Flow --      Pain Score 08/25/24 1421 7     Pain Loc --      Pain Education --      Exclude from Growth Chart --    No data found.  Updated Vital Signs BP 131/81   Pulse 95   Temp 98 F (36.7 C)   Resp 18   SpO2 95%      Physical Exam Constitutional:      General: She is not in acute distress.    Appearance: She is well-developed. She is obese.  HENT:     Head: Normocephalic and atraumatic.  Eyes:     Conjunctiva/sclera: Conjunctivae normal.     Pupils: Pupils are equal, round, and reactive to light.  Cardiovascular:     Rate and Rhythm: Normal rate.  Pulmonary:  Effort: Pulmonary effort is normal. No respiratory distress.  Abdominal:     General: There is no distension.     Palpations: Abdomen is soft.  Musculoskeletal:        General: Swelling, tenderness and signs of injury present. No  deformity. Normal range of motion.     Cervical back: Normal range of motion.     Comments: Left wrist has swelling over the dorsum of the carpal bones.  No tenderness over the distal radius or ulna.  Slow but full range of motion.  Distal neurovascular is intact  Skin:    General: Skin is warm and dry.  Neurological:     Mental Status: She is alert.      UC Treatments / Results  Labs (all labs ordered are listed, but only abnormal results are displayed) Labs Reviewed - No data to display  EKG   Radiology DG Wrist Complete Left Result Date: 08/25/2024 CLINICAL DATA:  Fall, pain EXAM: LEFT WRIST - COMPLETE 3+ VIEW COMPARISON:  None Available. FINDINGS: Fall, pain irregularity noted within the triquetrum on the AP view. Small bone fragment noted posteriorly on the lateral view. Findings compatible with triquetral fracture. No subluxation or dislocation. Soft tissues are intact. IMPRESSION: Triquetral fracture. Electronically Signed   By: Franky Crease M.D.   On: 08/25/2024 14:48    Procedures Procedures (including critical care time)  Medications Ordered in UC Medications - No data to display  Initial Impression / Assessment and Plan / UC Course  I have reviewed the triage vital signs and the nursing notes.  Pertinent labs & imaging results that were available during my care of the patient were reviewed by me and considered in my medical decision making (see chart for details).     X-rays are reviewed with patient Ice elevation pain control discussed Patient is placed into a well-fitted brace. Final Clinical Impressions(s) / UC Diagnoses   Final diagnoses:  Acute pain of left wrist  Fall in home, initial encounter  Osteopenia after menopause  Closed nondisplaced fracture of triquetrum of left wrist, initial encounter     Discharge Instructions      Call tomorrow to set up an appointment for next week with orthopedic or sports medicine specialist May use ice and  elevation to reduce pain and swelling May take Tylenol or ibuprofen as needed for pain Wear brace at all times until you are seen for follow-up.  It may be removed briefly for washing    ED Prescriptions   None    PDMP not reviewed this encounter.   Maranda Jamee Jacob, MD 08/25/24 1537

## 2024-08-25 NOTE — Discharge Instructions (Signed)
 Call tomorrow to set up an appointment for next week with orthopedic or sports medicine specialist May use ice and elevation to reduce pain and swelling May take Tylenol or ibuprofen as needed for pain Wear brace at all times until you are seen for follow-up.  It may be removed briefly for washing

## 2024-08-25 NOTE — ED Triage Notes (Signed)
 Fell last night while getting apples out of the refrigerator by losing balance, caught self with left and right hand but has c/o left wrist pain. Has c/o pain to left wrist that shoots upward. Has had extra strength tylenol which did not help.

## 2024-08-26 ENCOUNTER — Telehealth: Payer: Self-pay

## 2024-08-26 NOTE — Telephone Encounter (Signed)
Called to check on patient. No answer. Left voicemail.

## 2024-08-28 ENCOUNTER — Other Ambulatory Visit: Payer: Self-pay | Admitting: Hematology & Oncology

## 2024-08-30 ENCOUNTER — Encounter: Payer: Self-pay | Admitting: Sports Medicine

## 2024-08-30 ENCOUNTER — Other Ambulatory Visit (HOSPITAL_COMMUNITY): Payer: Self-pay

## 2024-08-30 ENCOUNTER — Ambulatory Visit (INDEPENDENT_AMBULATORY_CARE_PROVIDER_SITE_OTHER): Admitting: Sports Medicine

## 2024-08-30 VITALS — BP 110/80 | Ht 62.0 in | Wt 246.0 lb

## 2024-08-30 DIAGNOSIS — S62115A Nondisplaced fracture of triquetrum [cuneiform] bone, left wrist, initial encounter for closed fracture: Secondary | ICD-10-CM | POA: Diagnosis not present

## 2024-08-30 DIAGNOSIS — M25532 Pain in left wrist: Secondary | ICD-10-CM

## 2024-08-30 DIAGNOSIS — W19XXXD Unspecified fall, subsequent encounter: Secondary | ICD-10-CM

## 2024-08-30 NOTE — Progress Notes (Signed)
   Subjective:    Patient ID: Sara Sanford, female    DOB: 12-06-1956, 68 y.o.   MRN: [id number]  HPI chief complaint: Left wrist pain  Sara Sanford is a very pleasant right-hand-dominant 68 year old female who presents today after having injured her left wrist 5 days ago.  She fell and ended on*hand.  Was seen at a local urgent care where x-rays showed a nondisplaced triquetral fracture.  She was placed into a wrist splint and follows up with us  today.  Pain is improving.  Pain is primarily on the dorsum of the wrist.  She has been taking Tylenol for pain control.  Past medical history reviewed Medications reviewed Allergies reviewed   Review of Systems As above    Objective:   Physical Exam  Well-developed, well-nourished.  No acute distress  Left wrist: Limited range of motion secondary to pain.  Mild soft tissue swelling.  She is tender to palpation over the fracture site.  Good pulses  X-rays of the left wrist are personally reviewed.  Results are as above    Assessment & Plan:   Triquetral fracture, left wrist  Patient will follow-up with Dr.Gottwalt next week.  She will continue in her wrist brace and will get follow-up x-rays prior to that visit.

## 2024-09-01 ENCOUNTER — Encounter (INDEPENDENT_AMBULATORY_CARE_PROVIDER_SITE_OTHER): Payer: Self-pay

## 2024-09-02 ENCOUNTER — Other Ambulatory Visit: Payer: Self-pay

## 2024-09-02 NOTE — Progress Notes (Signed)
 Specialty Pharmacy Refill Coordination Note  Sara Sanford is a 68 y.o. female contacted today regarding refills of specialty medication(s) Venetoclax  (VENCLEXTA )   Patient requested (Patient-Rptd) Delivery   Delivery date: 09/03/24   Verified address: (Patient-Rptd) [address]  [place]   Medication will be filled on 09/02/24.

## 2024-09-06 ENCOUNTER — Ambulatory Visit

## 2024-09-06 ENCOUNTER — Ambulatory Visit (HOSPITAL_BASED_OUTPATIENT_CLINIC_OR_DEPARTMENT_OTHER)
Admission: RE | Admit: 2024-09-06 | Discharge: 2024-09-06 | Disposition: A | Discharge status: Home / Self Care | Source: Ambulatory Visit | Attending: Sports Medicine | Admitting: Sports Medicine

## 2024-09-06 VITALS — BP 112/78 | Ht 62.0 in | Wt 246.0 lb

## 2024-09-06 DIAGNOSIS — S62115A Nondisplaced fracture of triquetrum [cuneiform] bone, left wrist, initial encounter for closed fracture: Secondary | ICD-10-CM

## 2024-09-06 DIAGNOSIS — M1812 Unilateral primary osteoarthritis of first carpometacarpal joint, left hand: Secondary | ICD-10-CM | POA: Insufficient documentation

## 2024-09-06 DIAGNOSIS — W19XXXD Unspecified fall, subsequent encounter: Secondary | ICD-10-CM

## 2024-09-06 DIAGNOSIS — W19XXXA Unspecified fall, initial encounter: Secondary | ICD-10-CM | POA: Diagnosis not present

## 2024-09-06 DIAGNOSIS — M25532 Pain in left wrist: Secondary | ICD-10-CM | POA: Diagnosis present

## 2024-09-06 DIAGNOSIS — M19032 Primary osteoarthritis, left wrist: Secondary | ICD-10-CM | POA: Insufficient documentation

## 2024-09-06 NOTE — Progress Notes (Signed)
   Subjective:    Patient ID: Sara Sanford, female    DOB: 68 y.o., 1956-09-15   MRN: [id number]  HPI  Chief Complaint: Left wrist triquetral fracture (12-day follow-up)  Patient evaluated by Dr. Evalene Chang on 08/30/24. Injured her wrist on 08/25/2024 when she fell on her hand.  Seen at local urgent care where x-rays showed a nondisplaced triquetral fracture.  Placed in a wrist splint initially which was continued after seeing Dr. Chang.  She has been very diligent with wearing brace constantly except for when she showers or washes dishes.  Only using extra strength Tylenol for pain.  No numbness or tingling.  4 view plain film Radiographs obtained of the left wrist today per my independent evaluation revealing interval bony callus formation surrounding prior documented triquetral fracture.  No notable displacement.  No other acute osseous abnormalities.    Objective:   Physical Exam Vitals:   09/06/24 1342  BP: 112/78   Left Wrist: Tenderness palpation dorsally over the triquetrum.  Slight limitations in wrist extension, flexion, ulnar deviation, radial deviation.  Intact capillary refill distally (less than 2 seconds).  Intact sensation throughout hand..     Assessment & Plan:   Sara Sanford is a 68 y.o. female presenting 12 days following a left wrist nondisplaced triquetral fracture.  Radiographs today show interval bony callus formation in the area of the prior noted fracture.  Her pain is well-controlled.  She has a normal neurovascular exam of the left hand.  Does have some noted stiffness in that region which is understandable.  Recommend continued immobilization with plan for 3-6 weeks total duration.  Will follow-up in 2 weeks for to reassess with radiographs underweight in the door.

## 2024-09-20 ENCOUNTER — Ambulatory Visit

## 2024-09-23 ENCOUNTER — Encounter (INDEPENDENT_AMBULATORY_CARE_PROVIDER_SITE_OTHER): Payer: Self-pay

## 2024-09-24 ENCOUNTER — Other Ambulatory Visit: Payer: Self-pay

## 2024-09-24 NOTE — Progress Notes (Signed)
 Specialty Pharmacy Refill Coordination Note  Sara Sanford is a 68 y.o. female contacted today regarding refills of specialty medication(s) Venetoclax  (VENCLEXTA )   Patient requested (Patient-Rptd) Delivery   Delivery date: 09/28/24   Verified address: (Patient-Rptd) [address]  [place], [place]. [address]   Medication will be filled on 09/27/24.

## 2024-09-27 ENCOUNTER — Other Ambulatory Visit: Payer: Self-pay

## 2024-09-29 ENCOUNTER — Other Ambulatory Visit (HOSPITAL_COMMUNITY): Payer: Self-pay

## 2024-09-29 ENCOUNTER — Telehealth: Payer: Self-pay

## 2024-09-29 NOTE — Telephone Encounter (Signed)
 Oral Oncology Patient Advocate Encounter  Was successful in securing patient a $8000 grant from [employer] to provide copayment coverage for venetoclax  (VENCLEXTA ) .  This will keep the out of pocket expense at $0.     [employer] ID: [id number]   The billing information is as follows and has been shared with [facility].    RxBin: [id number] PCN: [id number] Member ID: [id number] Group ID: [id number] Dates of Eligibility: 10/03/24 through 10/02/2025  Fund:  Chronic Lymphocytic Leukemia   Charlott Hamilton,  CPhT-Adv  she/her/hers [facility]  [facility] Specialty Pharmacy Services Pharmacy Technician Patient Advocate Specialist III [facility] Phone: [phone]  Fax: [phone] [email or web address]

## 2024-10-06 ENCOUNTER — Encounter: Payer: Self-pay | Admitting: Hematology & Oncology

## 2024-10-06 ENCOUNTER — Inpatient Hospital Stay: Attending: Hematology & Oncology

## 2024-10-06 ENCOUNTER — Inpatient Hospital Stay: Admitting: Hematology & Oncology

## 2024-10-06 VITALS — BP 94/38 | HR 58 | Temp 97.8°F | Resp 19 | Ht 62.0 in | Wt 245.8 lb

## 2024-10-06 DIAGNOSIS — R3915 Urgency of urination: Secondary | ICD-10-CM | POA: Diagnosis not present

## 2024-10-06 DIAGNOSIS — C9110 Chronic lymphocytic leukemia of B-cell type not having achieved remission: Secondary | ICD-10-CM

## 2024-10-06 DIAGNOSIS — Z79899 Other long term (current) drug therapy: Secondary | ICD-10-CM | POA: Insufficient documentation

## 2024-10-06 DIAGNOSIS — Z881 Allergy status to other antibiotic agents status: Secondary | ICD-10-CM | POA: Insufficient documentation

## 2024-10-06 DIAGNOSIS — R300 Dysuria: Secondary | ICD-10-CM | POA: Insufficient documentation

## 2024-10-06 LAB — CMP (CANCER CENTER ONLY)
ALT: 21 U/L (ref 0–44)
AST: 27 U/L (ref 15–41)
Albumin: 4.1 g/dL (ref 3.5–5.0)
Alkaline Phosphatase: 96 U/L (ref 38–126)
Anion gap: 9 (ref 5–15)
BUN: 11 mg/dL (ref 8–23)
CO2: 27 mmol/L (ref 22–32)
Calcium: 9.2 mg/dL (ref 8.9–10.3)
Chloride: 108 mmol/L (ref 98–111)
Creatinine: 0.75 mg/dL (ref 0.44–1.00)
GFR, Estimated: >60 mL/min (ref >60)
Glucose, Bld: 96 mg/dL (ref 70–99)
Potassium: 4.6 mmol/L (ref 3.5–5.1)
Sodium: 143 mmol/L (ref 135–145)
Total Bilirubin: 0.6 mg/dL (ref 0.0–1.2)
Total Protein: 6.1 g/dL — ABNORMAL LOW (ref 6.5–8.1)

## 2024-10-06 LAB — CBC WITH DIFFERENTIAL (CANCER CENTER ONLY)
Abs Immature Granulocytes: 0 K/uL (ref 0.00–0.07)
Basophils Absolute: 0 K/uL (ref 0.0–0.1)
Basophils Relative: 0 %
Eosinophils Absolute: 0 K/uL (ref 0.0–0.5)
Eosinophils Relative: 0 %
HCT: 35.5 % — ABNORMAL LOW (ref 36.0–46.0)
Hemoglobin: 12.5 g/dL (ref 12.0–15.0)
Immature Granulocytes: 0 %
Lymphocytes Relative: 31 %
Lymphs Abs: 1 K/uL (ref 0.7–4.0)
MCH: 33.5 pg (ref 26.0–34.0)
MCHC: 35.2 g/dL (ref 30.0–36.0)
MCV: 95.2 fL (ref 80.0–100.0)
Monocytes Absolute: 0.4 K/uL (ref 0.1–1.0)
Monocytes Relative: 13 %
Neutro Abs: 1.9 K/uL (ref 1.7–7.7)
Neutrophils Relative %: 56 %
Platelet Count: 164 K/uL (ref 150–400)
RBC: 3.73 MIL/uL — ABNORMAL LOW (ref 3.87–5.11)
RDW: 12.8 % (ref 11.5–15.5)
WBC Count: 3.4 K/uL — ABNORMAL LOW (ref 4.0–10.5)
nRBC: 0 % (ref 0.0–0.2)

## 2024-10-06 LAB — SAVE SMEAR(SSMR), FOR PROVIDER SLIDE REVIEW

## 2024-10-06 NOTE — Progress Notes (Signed)
 Hematology and Oncology Follow Up Visit  Sara Sanford [id number] 1956/03/17 68 y.o. 10/06/2024   Principle Diagnosis:                CLL FISH panel: (+) for izo(86v85); neg for del(17p) or t(11;14)  IgHV unmutated                Current Therapy: Calquence  100 mg PO BID -- d/c on 07/15/2022 Venetoclax  200 mg po q day -- start on -08/2020 -change on 04/15/2024   Interim History:  Sara Sanford is here today for follow-up.  We last saw Sara Sanford back in August.  Since then, she has been doing pretty well.  She has had no real complaints.  She says she had little bit of weeping from the left lower leg.  I am not sure this is anything related to Sara Sanford CLL.  This seemed to stop after couple of weeks.  She has had no fever.  She has had no nausea or vomiting.  She has had no cough or shortness of breath.  She has had no change in bowel or bladder habits.  She has had no change in medications.  She is on venetoclax .  I think she is doing well on the venetoclax .  She and Sara Sanford family agreed on plan on a big Halloween cookout.  They always do this.  She still has a lot of problems with Sara Sanford knees.  Unfortunately, she is not able to have surgery for this because of Sara Sanford size.  Overall, I would have to say that Sara Sanford performance status is ECOG 1.    Medications:  Allergies as of 10/06/2024       Reactions   Propoxyphene Anaphylaxis, Shortness Of Breath, Other (See Comments)   Hallucinations   Keflex  [cephalexin ] Diarrhea   Nitrofurantoin  Diarrhea, Rash   Welts with blisters        Medication List        Accurate as of October 06, 2024  3:02 PM. If you have any questions, ask your nurse or doctor.          clobetasol  ointment 0.05 % Commonly known as: TEMOVATE  Apply to affected area every night for 4 weeks, then every other day for 4 weeks and then twice a week for 4 weeks or until resolution. May also take prn   cyanocobalamin  1000 MCG tablet Commonly known as: VITAMIN B12 Take  1,000 mcg by mouth daily.   FLUoxetine  20 MG capsule Commonly known as: PROZAC  Take 1 capsule (20 mg total) by mouth daily.   folic acid  1 MG tablet Commonly known as: FOLVITE  TAKE 2 TABLETS BY MOUTH DAILY   omeprazole  20 MG capsule Commonly known as: PRILOSEC Take 20 mg by mouth every other day.   Venclexta  100 MG tablet Generic drug: venetoclax  Take 2 tablets (200 mg total) by mouth daily.        Allergies:  Allergies  Allergen Reactions   Propoxyphene Anaphylaxis, Shortness Of Breath and Other (See Comments)    Hallucinations    Keflex  [Cephalexin ] Diarrhea   Nitrofurantoin  Diarrhea and Rash    Welts with blisters    Past Medical History, Surgical history, Social history, and Family History were reviewed and updated.  Review of Systems: Review of Systems  Constitutional: Negative.   HENT: Negative.    Eyes: Negative.   Respiratory: Negative.    Cardiovascular: Negative.   Gastrointestinal: Negative.   Genitourinary:  Positive for dysuria, frequency and urgency.  Musculoskeletal: Negative.  Skin: Negative.   Neurological: Negative.   Endo/Heme/Allergies: Negative.   Psychiatric/Behavioral: Negative.       Physical Exam:  height is 5' 2 (1.575 m) and weight is 245 lb 12.8 oz (111.5 kg). Sara Sanford oral temperature is 97.8 F (36.6 C). Sara Sanford blood pressure is 94/38 (abnormal) and Sara Sanford pulse is 58 (abnormal). Sara Sanford respiration is 19 and oxygen saturation is 100%.   Wt Readings from Last 3 Encounters:  10/06/24 245 lb 12.8 oz (111.5 kg)  09/06/24 246 lb (111.6 kg)  08/30/24 246 lb (111.6 kg)    Physical Exam Vitals reviewed.  HENT:     Head: Normocephalic and atraumatic.  Eyes:     Pupils: Pupils are equal, round, and reactive to light.  Cardiovascular:     Rate and Rhythm: Normal rate and regular rhythm.     Heart sounds: Normal heart sounds.  Pulmonary:     Effort: Pulmonary effort is normal.     Breath sounds: Normal breath sounds.  Abdominal:      General: Bowel sounds are normal.     Palpations: Abdomen is soft.  Musculoskeletal:        General: No tenderness or deformity. Normal range of motion.     Cervical back: Normal range of motion.  Lymphadenopathy:     Cervical: No cervical adenopathy.  Skin:    General: Skin is warm and dry.     Findings: No erythema or rash.  Neurological:     Mental Status: She is alert and oriented to person, place, and time.  Psychiatric:        Behavior: Behavior normal.        Thought Content: Thought content normal.        Judgment: Judgment normal.      Lab Results  Component Value Date   WBC 3.4 (L) 10/06/2024   HGB 12.5 10/06/2024   HCT 35.5 (L) 10/06/2024   MCV 95.2 10/06/2024   PLT 164 10/06/2024   Lab Results  Component Value Date   FERRITIN 25 05/15/2020   IRON 89 05/15/2020   TIBC 344 05/15/2020   UIBC 255 05/15/2020   IRONPCTSAT 26 05/15/2020   Lab Results  Component Value Date   RETICCTPCT 2.5 05/15/2020   RBC 3.73 (L) 10/06/2024   Lab Results  Component Value Date   KPAFRELGTCHN 38.9 (H) 10/15/2023   LAMBDASER 5.5 (L) 10/15/2023   KAPLAMBRATIO 7.07 (H) 10/15/2023   Lab Results  Component Value Date   IGGSERUM 606 08/05/2024   IGA 28 (L) 08/05/2024   IGMSERUM 68 08/05/2024   Lab Results  Component Value Date   TOTALPROTELP 6.0 10/15/2023   ALBUMINELP 4.0 04/14/2023   A1GS 0.2 04/14/2023   A2GS 0.7 04/14/2023   BETS 1.0 04/14/2023   GAMS 0.5 04/14/2023   MSPIKE Not Observed 04/14/2023   SPEI Comment 04/14/2023     Chemistry      Component Value Date/Time   NA 142 08/05/2024 1315   K 4.4 08/05/2024 1315   CL 107 08/05/2024 1315   CO2 24 08/05/2024 1315   BUN 8 08/05/2024 1315   CREATININE 0.80 08/05/2024 1315   CREATININE 0.69 02/01/2020 1149      Component Value Date/Time   CALCIUM  9.2 08/05/2024 1315   ALKPHOS 90 08/05/2024 1315   AST 26 08/05/2024 1315   ALT 19 08/05/2024 1315   BILITOT 0.7 08/05/2024 1315       Impression and  Plan: Sara Sanford is a very pleasant 68 yo  caucasian female with CLL.  We have Sara Sanford on venetoclax .  I think she is doing well on the venetoclax .  Sara Sanford blood smear looks okay.  I do not see anything that looks active.  We will plan to get Sara Sanford through the Holiday season now.  I know that she will be incredibly busy over the Holiday season.  I will check Sara Sanford flow cytometry when we see Sara Sanford back to see what the number of CLL cells are in the marrow.     Maude JONELLE Crease, MD 10/29/20253:02 PM

## 2024-10-14 ENCOUNTER — Other Ambulatory Visit: Payer: Self-pay

## 2024-10-14 NOTE — Progress Notes (Signed)
 Specialty Pharmacy Refill Coordination Note  Sara Sanford is a 68 y.o. female contacted today regarding refills of specialty medication(s) Venetoclax  (VENCLEXTA )   Patient requested Delivery   Delivery date: 10/21/24   Verified address: [address]  [place], [place]. [address]   Medication will be filled on: 10/20/24

## 2024-10-14 NOTE — Progress Notes (Signed)
 Specialty Pharmacy Ongoing Clinical Assessment Note  Sara Sanford is a 68 y.o. female who is being followed by the specialty pharmacy service for RxSp Oncology   Patient's specialty medication(s) reviewed today: Venetoclax  (VENCLEXTA )   Missed doses in the last 4 weeks: 0   Patient/Caregiver did not have any additional questions or concerns.   Therapeutic benefit summary: Patient is achieving benefit   Adverse events/side effects summary: No adverse events/side effects   Patient's therapy is appropriate to: Continue    Goals Addressed             This Visit's Progress    Achieve or maintain remission   On track    Patient is on track. Patient will maintain adherence. Per OV on 10/29, Dr. Timmy stated that her blood smear looked okay with nothing seeming to be active and would plan to recheck flow cytometry at next appointment.         Follow up: 6 months  [facility]

## 2024-10-20 ENCOUNTER — Other Ambulatory Visit: Payer: Self-pay

## 2024-11-18 ENCOUNTER — Other Ambulatory Visit: Payer: Self-pay

## 2024-11-18 ENCOUNTER — Other Ambulatory Visit: Payer: Self-pay | Admitting: Hematology & Oncology

## 2024-11-18 ENCOUNTER — Other Ambulatory Visit (HOSPITAL_COMMUNITY): Payer: Self-pay

## 2024-11-18 DIAGNOSIS — C9110 Chronic lymphocytic leukemia of B-cell type not having achieved remission: Secondary | ICD-10-CM

## 2024-11-18 MED ORDER — VENETOCLAX 100 MG PO TABS
200.0000 mg | ORAL_TABLET | Freq: Every day | ORAL | 3 refills | Status: AC
  Filled 2024-11-18: qty 60, 30d supply, fill #0
  Filled 2024-11-19: qty 56, 28d supply, fill #0
  Filled 2024-12-14: qty 56, 28d supply, fill #1
  Filled 2025-01-10: qty 56, 28d supply, fill #2

## 2024-11-19 ENCOUNTER — Other Ambulatory Visit (HOSPITAL_COMMUNITY): Payer: Self-pay

## 2024-11-19 ENCOUNTER — Other Ambulatory Visit: Payer: Self-pay

## 2024-11-19 NOTE — Progress Notes (Signed)
 Specialty Pharmacy Refill Coordination Note  MyChart Questionnaire Submission  Sara Sanford is a 68 y.o. female contacted today regarding refills of specialty medication(s) Venclexta .  Doses on hand: 10 on 11/18/24   Patient requested: (Patient-Rptd) Delivery   Delivery date: 11/24/24  Verified address: [address] [place] [address]  Medication will be filled on 11/23/24

## 2024-12-08 ENCOUNTER — Other Ambulatory Visit (HOSPITAL_BASED_OUTPATIENT_CLINIC_OR_DEPARTMENT_OTHER): Payer: Self-pay | Admitting: Family Medicine

## 2024-12-08 DIAGNOSIS — Z1231 Encounter for screening mammogram for malignant neoplasm of breast: Secondary | ICD-10-CM

## 2024-12-14 ENCOUNTER — Encounter (HOSPITAL_BASED_OUTPATIENT_CLINIC_OR_DEPARTMENT_OTHER): Payer: Self-pay

## 2024-12-14 ENCOUNTER — Ambulatory Visit (HOSPITAL_BASED_OUTPATIENT_CLINIC_OR_DEPARTMENT_OTHER)
Admission: RE | Admit: 2024-12-14 | Discharge: 2024-12-14 | Disposition: A | Discharge status: Home / Self Care | Source: Ambulatory Visit | Attending: Family Medicine | Admitting: Family Medicine

## 2024-12-14 ENCOUNTER — Other Ambulatory Visit: Payer: Self-pay

## 2024-12-14 DIAGNOSIS — Z1231 Encounter for screening mammogram for malignant neoplasm of breast: Secondary | ICD-10-CM | POA: Diagnosis present

## 2024-12-16 ENCOUNTER — Ambulatory Visit: Payer: Self-pay | Admitting: Family Medicine

## 2024-12-16 ENCOUNTER — Other Ambulatory Visit (HOSPITAL_COMMUNITY): Payer: Self-pay

## 2024-12-16 NOTE — Progress Notes (Signed)
 Please call patient. Normal mammogram.  Repeat in 1 year.

## 2024-12-20 ENCOUNTER — Inpatient Hospital Stay: Admitting: Hematology & Oncology

## 2024-12-20 ENCOUNTER — Other Ambulatory Visit: Payer: Self-pay

## 2024-12-20 ENCOUNTER — Inpatient Hospital Stay: Attending: Hematology & Oncology

## 2024-12-20 ENCOUNTER — Encounter: Payer: Self-pay | Admitting: Hematology & Oncology

## 2024-12-20 VITALS — BP 103/47 | HR 61 | Temp 98.0°F | Resp 16 | Ht 62.0 in | Wt 245.0 lb

## 2024-12-20 DIAGNOSIS — Z881 Allergy status to other antibiotic agents status: Secondary | ICD-10-CM | POA: Insufficient documentation

## 2024-12-20 DIAGNOSIS — R300 Dysuria: Secondary | ICD-10-CM | POA: Insufficient documentation

## 2024-12-20 DIAGNOSIS — R350 Frequency of micturition: Secondary | ICD-10-CM | POA: Insufficient documentation

## 2024-12-20 DIAGNOSIS — C9110 Chronic lymphocytic leukemia of B-cell type not having achieved remission: Secondary | ICD-10-CM

## 2024-12-20 DIAGNOSIS — R3915 Urgency of urination: Secondary | ICD-10-CM | POA: Diagnosis not present

## 2024-12-20 DIAGNOSIS — Z79899 Other long term (current) drug therapy: Secondary | ICD-10-CM | POA: Insufficient documentation

## 2024-12-20 LAB — CBC WITH DIFFERENTIAL (CANCER CENTER ONLY)
Abs Immature Granulocytes: 0.01 K/uL (ref 0.00–0.07)
Basophils Absolute: 0 K/uL (ref 0.0–0.1)
Basophils Relative: 0 %
Eosinophils Absolute: 0 K/uL (ref 0.0–0.5)
Eosinophils Relative: 0 %
HCT: 35.6 % — ABNORMAL LOW (ref 36.0–46.0)
Hemoglobin: 12.4 g/dL (ref 12.0–15.0)
Immature Granulocytes: 0 %
Lymphocytes Relative: 27 %
Lymphs Abs: 0.9 K/uL (ref 0.7–4.0)
MCH: 33 pg (ref 26.0–34.0)
MCHC: 34.8 g/dL (ref 30.0–36.0)
MCV: 94.7 fL (ref 80.0–100.0)
Monocytes Absolute: 0.4 K/uL (ref 0.1–1.0)
Monocytes Relative: 12 %
Neutro Abs: 1.9 K/uL (ref 1.7–7.7)
Neutrophils Relative %: 61 %
Platelet Count: 152 K/uL (ref 150–400)
RBC: 3.76 MIL/uL — ABNORMAL LOW (ref 3.87–5.11)
RDW: 12.9 % (ref 11.5–15.5)
WBC Count: 3.1 K/uL — ABNORMAL LOW (ref 4.0–10.5)
nRBC: 0 % (ref 0.0–0.2)

## 2024-12-20 LAB — CMP (CANCER CENTER ONLY)
ALT: 19 U/L (ref 0–44)
AST: 26 U/L (ref 15–41)
Albumin: 4.2 g/dL (ref 3.5–5.0)
Alkaline Phosphatase: 94 U/L (ref 38–126)
Anion gap: 10 (ref 5–15)
BUN: 9 mg/dL (ref 8–23)
CO2: 25 mmol/L (ref 22–32)
Calcium: 8.8 mg/dL — ABNORMAL LOW (ref 8.9–10.3)
Chloride: 108 mmol/L (ref 98–111)
Creatinine: 0.78 mg/dL (ref 0.44–1.00)
GFR, Estimated: >60 mL/min
Glucose, Bld: 88 mg/dL (ref 70–99)
Potassium: 3.7 mmol/L (ref 3.5–5.1)
Sodium: 143 mmol/L (ref 135–145)
Total Bilirubin: 0.6 mg/dL (ref 0.0–1.2)
Total Protein: 6.2 g/dL — ABNORMAL LOW (ref 6.5–8.1)

## 2024-12-20 LAB — SAVE SMEAR(SSMR), FOR PROVIDER SLIDE REVIEW

## 2024-12-20 NOTE — Progress Notes (Signed)
 " Hematology and Oncology Follow Up Visit  Sara Sanford [id number] 1956/03/31 69 y.o. 12/20/2024   Principle Diagnosis:                CLL FISH panel: (+) for izo(86v85); neg for del(17p) or t(11;14)  IgHV unmutated                Current Therapy: Calquence  100 mg PO BID -- d/c on 07/15/2022 Venetoclax  200 mg po q day -- start on -08/2020 -change on 04/15/2024   Interim History:  Sara Sanford is here today for follow-up.  We last saw her back in October.  Since then, she been doing well.  She had a wonderful Holiday season.  She is with her family.  She is trying to lose little bit of weight.  However, she really is happy at the weight that she is at right now.  She has had no problems with the venetoclax .  She is doing well with this.  She is having no diarrhea.  There is no nausea or vomiting.  She has had no change in bowel or bladder habits.  There is been no bleeding.  She does have knee problems.  She does do some chair yoga.  This has helped her.  She has had no swollen lymph nodes.  She has had no headache.  Overall, I will have to say that her performance status is probably ECOG 1.    Medications:  Allergies as of 12/20/2024       Reactions   Propoxyphene Anaphylaxis, Shortness Of Breath, Other (See Comments)   Hallucinations   Keflex  [cephalexin ] Diarrhea   Nitrofurantoin  Diarrhea, Rash   Welts with blisters        Medication List        Accurate as of December 20, 2024 12:35 PM. If you have any questions, ask your nurse or doctor.          clobetasol  ointment 0.05 % Commonly known as: TEMOVATE  Apply to affected area every night for 4 weeks, then every other day for 4 weeks and then twice a week for 4 weeks or until resolution. May also take prn   cyanocobalamin  1000 MCG tablet Commonly known as: VITAMIN B12 Take 1,000 mcg by mouth daily.   FLUoxetine  20 MG capsule Commonly known as: PROZAC  Take 1 capsule (20 mg total) by mouth daily.   folic acid   1 MG tablet Commonly known as: FOLVITE  TAKE 2 TABLETS BY MOUTH DAILY   omeprazole  20 MG capsule Commonly known as: PRILOSEC Take 20 mg by mouth every other day.   Venclexta  100 MG tablet Generic drug: venetoclax  Take 2 tablets (200 mg total) by mouth daily.        Allergies:  Allergies  Allergen Reactions   Propoxyphene Anaphylaxis, Shortness Of Breath and Other (See Comments)    Hallucinations    Keflex  [Cephalexin ] Diarrhea   Nitrofurantoin  Diarrhea and Rash    Welts with blisters    Past Medical History, Surgical history, Social history, and Family History were reviewed and updated.  Review of Systems: Review of Systems  Constitutional: Negative.   HENT: Negative.    Eyes: Negative.   Respiratory: Negative.    Cardiovascular: Negative.   Gastrointestinal: Negative.   Genitourinary:  Positive for dysuria, frequency and urgency.  Musculoskeletal: Negative.   Skin: Negative.   Neurological: Negative.   Endo/Heme/Allergies: Negative.   Psychiatric/Behavioral: Negative.       Physical Exam:  height is 5' 2 (1.575  m) and weight is 245 lb (111.1 kg). Her oral temperature is 98 F (36.7 C). Her blood pressure is 103/47 (abnormal) and her pulse is 61. Her respiration is 16 and oxygen saturation is 98%.   Wt Readings from Last 3 Encounters:  12/20/24 245 lb (111.1 kg)  10/06/24 245 lb 12.8 oz (111.5 kg)  09/06/24 246 lb (111.6 kg)    Physical Exam Vitals reviewed.  HENT:     Head: Normocephalic and atraumatic.  Eyes:     Pupils: Pupils are equal, round, and reactive to light.  Cardiovascular:     Rate and Rhythm: Normal rate and regular rhythm.     Heart sounds: Normal heart sounds.  Pulmonary:     Effort: Pulmonary effort is normal.     Breath sounds: Normal breath sounds.  Abdominal:     General: Bowel sounds are normal.     Palpations: Abdomen is soft.  Musculoskeletal:        General: No tenderness or deformity. Normal range of motion.      Cervical back: Normal range of motion.  Lymphadenopathy:     Cervical: No cervical adenopathy.  Skin:    General: Skin is warm and dry.     Findings: No erythema or rash.  Neurological:     Mental Status: She is alert and oriented to person, place, and time.  Psychiatric:        Behavior: Behavior normal.        Thought Content: Thought content normal.        Judgment: Judgment normal.      Lab Results  Component Value Date   WBC 3.1 (L) 12/20/2024   HGB 12.4 12/20/2024   HCT 35.6 (L) 12/20/2024   MCV 94.7 12/20/2024   PLT 152 12/20/2024   Lab Results  Component Value Date   FERRITIN 25 05/15/2020   IRON 89 05/15/2020   TIBC 344 05/15/2020   UIBC 255 05/15/2020   IRONPCTSAT 26 05/15/2020   Lab Results  Component Value Date   RETICCTPCT 2.5 05/15/2020   RBC 3.76 (L) 12/20/2024   Lab Results  Component Value Date   KPAFRELGTCHN 38.9 (H) 10/15/2023   LAMBDASER 5.5 (L) 10/15/2023   KAPLAMBRATIO 7.07 (H) 10/15/2023   Lab Results  Component Value Date   IGGSERUM 606 08/05/2024   IGA 28 (L) 08/05/2024   IGMSERUM 68 08/05/2024   Lab Results  Component Value Date   TOTALPROTELP 6.0 10/15/2023   ALBUMINELP 4.0 04/14/2023   A1GS 0.2 04/14/2023   A2GS 0.7 04/14/2023   BETS 1.0 04/14/2023   GAMS 0.5 04/14/2023   MSPIKE Not Observed 04/14/2023   SPEI Comment 04/14/2023     Chemistry      Component Value Date/Time   NA 143 10/06/2024 1441   K 4.6 10/06/2024 1441   CL 108 10/06/2024 1441   CO2 27 10/06/2024 1441   BUN 11 10/06/2024 1441   CREATININE 0.75 10/06/2024 1441   CREATININE 0.69 02/01/2020 1149      Component Value Date/Time   CALCIUM  9.2 10/06/2024 1441   ALKPHOS 96 10/06/2024 1441   AST 27 10/06/2024 1441   ALT 21 10/06/2024 1441   BILITOT 0.6 10/06/2024 1441       Impression and Plan: Sara Sanford is a very pleasant 69 yo caucasian female with CLL.  We have her on venetoclax .  I think she is doing well on the venetoclax .  Her blood smear  looks okay.  I do not see  anything that looks active.  Very happy that her CLL has responded nicely to the venetoclax .  We are checking her peripheral blood for flow cytometry to see what the percentage of monoclonal lymphocytes are.  I think we can probably get her back in about 3 or 4 months.  We will get her through the Winter.     Maude JONELLE Crease, MD 1/12/202612:35 PM "

## 2024-12-20 NOTE — Progress Notes (Signed)
 Specialty Pharmacy Refill Coordination Note  Sara Sanford is a 69 y.o. female contacted today regarding refills of specialty medication(s) Venetoclax  (VENCLEXTA )   Patient requested Delivery   Delivery date: 12/21/24   Verified address: [address]  [place].     [address]   Medication will be filled on: 12/20/24

## 2024-12-21 ENCOUNTER — Ambulatory Visit: Payer: Self-pay | Admitting: Hematology & Oncology

## 2024-12-21 LAB — SURGICAL PATHOLOGY

## 2024-12-22 LAB — FLOW CYTOMETRY

## 2024-12-22 NOTE — Telephone Encounter (Signed)
-----   Message from Maude Crease, MD sent at 12/21/2024  5:29 PM EST ----- Call - the CLL cells are now less than 10%.  This is wonderful.  The pills are working!! Jeralyn

## 2024-12-22 NOTE — Telephone Encounter (Signed)
 Advised via MyChart.

## 2025-01-10 ENCOUNTER — Other Ambulatory Visit: Payer: Self-pay

## 2025-01-12 ENCOUNTER — Other Ambulatory Visit (HOSPITAL_COMMUNITY): Payer: Self-pay

## 2025-01-12 NOTE — Progress Notes (Signed)
 Specialty Pharmacy Refill Coordination Note  Sara Sanford is a 69 y.o. female contacted today regarding refills of specialty medication(s) Venetoclax  (VENCLEXTA )   Patient requested Delivery   Delivery date: 01/17/25   Verified address: [address]  [place].     [address]   Medication will be filled on: 01/14/25

## 2025-01-14 ENCOUNTER — Other Ambulatory Visit: Payer: Self-pay

## 2025-03-30 ENCOUNTER — Inpatient Hospital Stay: Admitting: Hematology & Oncology

## 2025-03-30 ENCOUNTER — Inpatient Hospital Stay: Attending: Hematology & Oncology
